# Patient Record
Sex: Female | Born: 1935 | Race: White | Hispanic: No | Smoking: Former smoker
Health system: Southern US, Community
[De-identification: ages and names within clinical notes are randomized; demographics above are authoritative.]

## PROBLEM LIST (undated history)

## (undated) DIAGNOSIS — Z853 Personal history of malignant neoplasm of breast: Secondary | ICD-10-CM

## (undated) DIAGNOSIS — I10 Essential (primary) hypertension: Secondary | ICD-10-CM

## (undated) DIAGNOSIS — I499 Cardiac arrhythmia, unspecified: Secondary | ICD-10-CM

## (undated) DIAGNOSIS — I219 Acute myocardial infarction, unspecified: Secondary | ICD-10-CM

## (undated) DIAGNOSIS — Z808 Family history of malignant neoplasm of other organs or systems: Secondary | ICD-10-CM

## (undated) DIAGNOSIS — C4490 Unspecified malignant neoplasm of skin, unspecified: Secondary | ICD-10-CM

## (undated) DIAGNOSIS — M1990 Unspecified osteoarthritis, unspecified site: Secondary | ICD-10-CM

## (undated) DIAGNOSIS — Z955 Presence of coronary angioplasty implant and graft: Secondary | ICD-10-CM

## (undated) DIAGNOSIS — Z9889 Other specified postprocedural states: Secondary | ICD-10-CM

## (undated) DIAGNOSIS — K529 Noninfective gastroenteritis and colitis, unspecified: Secondary | ICD-10-CM

## (undated) DIAGNOSIS — I2510 Atherosclerotic heart disease of native coronary artery without angina pectoris: Secondary | ICD-10-CM

## (undated) DIAGNOSIS — R002 Palpitations: Secondary | ICD-10-CM

## (undated) DIAGNOSIS — H9190 Unspecified hearing loss, unspecified ear: Secondary | ICD-10-CM

## (undated) DIAGNOSIS — Z803 Family history of malignant neoplasm of breast: Secondary | ICD-10-CM

## (undated) DIAGNOSIS — I519 Heart disease, unspecified: Secondary | ICD-10-CM

## (undated) DIAGNOSIS — C801 Malignant (primary) neoplasm, unspecified: Secondary | ICD-10-CM

## (undated) DIAGNOSIS — Z923 Personal history of irradiation: Secondary | ICD-10-CM

## (undated) DIAGNOSIS — F419 Anxiety disorder, unspecified: Secondary | ICD-10-CM

## (undated) DIAGNOSIS — M8580 Other specified disorders of bone density and structure, unspecified site: Secondary | ICD-10-CM

## (undated) DIAGNOSIS — E7800 Pure hypercholesterolemia, unspecified: Secondary | ICD-10-CM

## (undated) DIAGNOSIS — E039 Hypothyroidism, unspecified: Secondary | ICD-10-CM

## (undated) DIAGNOSIS — K5792 Diverticulitis of intestine, part unspecified, without perforation or abscess without bleeding: Secondary | ICD-10-CM

## (undated) DIAGNOSIS — C50919 Malignant neoplasm of unspecified site of unspecified female breast: Secondary | ICD-10-CM

## (undated) DIAGNOSIS — K219 Gastro-esophageal reflux disease without esophagitis: Secondary | ICD-10-CM

## (undated) DIAGNOSIS — N189 Chronic kidney disease, unspecified: Secondary | ICD-10-CM

## (undated) DIAGNOSIS — M7030 Other bursitis of elbow, unspecified elbow: Secondary | ICD-10-CM

## (undated) HISTORY — DX: Malignant neoplasm of unspecified site of unspecified female breast: C50.919

## (undated) HISTORY — DX: Family history of malignant neoplasm of breast: Z80.3

## (undated) HISTORY — DX: Heart disease, unspecified: I51.9

## (undated) HISTORY — DX: Family history of malignant neoplasm of other organs or systems: Z80.8

## (undated) HISTORY — PX: COLONOSCOPY: SHX174

## (undated) HISTORY — PX: CHOLECYSTECTOMY: SHX55

## (undated) HISTORY — DX: Other specified postprocedural states: Z98.890

## (undated) HISTORY — PX: EYE SURGERY: SHX253

## (undated) HISTORY — DX: Unspecified malignant neoplasm of skin, unspecified: C44.90

## (undated) HISTORY — DX: Personal history of malignant neoplasm of breast: Z85.3

---

## 2005-06-01 DIAGNOSIS — Z955 Presence of coronary angioplasty implant and graft: Secondary | ICD-10-CM

## 2005-06-01 HISTORY — DX: Presence of coronary angioplasty implant and graft: Z95.5

## 2005-06-01 HISTORY — PX: CORONARY ANGIOPLASTY: SHX604

## 2011-06-02 DIAGNOSIS — C50919 Malignant neoplasm of unspecified site of unspecified female breast: Secondary | ICD-10-CM

## 2011-06-02 DIAGNOSIS — C801 Malignant (primary) neoplasm, unspecified: Secondary | ICD-10-CM

## 2011-06-02 HISTORY — PX: BREAST LUMPECTOMY: SHX2

## 2011-06-02 HISTORY — DX: Malignant neoplasm of unspecified site of unspecified female breast: C50.919

## 2011-06-02 HISTORY — DX: Malignant (primary) neoplasm, unspecified: C80.1

## 2015-07-23 ENCOUNTER — Encounter
Admission: RE | Disposition: A | Discharge status: Home / Self Care | Payer: Self-pay | Source: Ambulatory Visit | Attending: Ophthalmology

## 2015-07-23 ENCOUNTER — Ambulatory Visit: Payer: Medicare PPO | Admitting: Anesthesiology

## 2015-07-23 ENCOUNTER — Encounter: Payer: Self-pay | Admitting: *Deleted

## 2015-07-23 ENCOUNTER — Ambulatory Visit
Admission: RE | Admit: 2015-07-23 | Discharge: 2015-07-23 | Disposition: A | Discharge status: Home / Self Care | Payer: Medicare PPO | Source: Ambulatory Visit | Attending: Ophthalmology | Admitting: Ophthalmology

## 2015-07-23 DIAGNOSIS — I252 Old myocardial infarction: Secondary | ICD-10-CM | POA: Diagnosis not present

## 2015-07-23 DIAGNOSIS — M8580 Other specified disorders of bone density and structure, unspecified site: Secondary | ICD-10-CM | POA: Diagnosis not present

## 2015-07-23 DIAGNOSIS — Z9049 Acquired absence of other specified parts of digestive tract: Secondary | ICD-10-CM | POA: Diagnosis not present

## 2015-07-23 DIAGNOSIS — Z888 Allergy status to other drugs, medicaments and biological substances status: Secondary | ICD-10-CM | POA: Insufficient documentation

## 2015-07-23 DIAGNOSIS — K219 Gastro-esophageal reflux disease without esophagitis: Secondary | ICD-10-CM | POA: Insufficient documentation

## 2015-07-23 DIAGNOSIS — Z955 Presence of coronary angioplasty implant and graft: Secondary | ICD-10-CM | POA: Diagnosis not present

## 2015-07-23 DIAGNOSIS — F419 Anxiety disorder, unspecified: Secondary | ICD-10-CM | POA: Insufficient documentation

## 2015-07-23 DIAGNOSIS — H9193 Unspecified hearing loss, bilateral: Secondary | ICD-10-CM | POA: Diagnosis not present

## 2015-07-23 DIAGNOSIS — Z950 Presence of cardiac pacemaker: Secondary | ICD-10-CM | POA: Diagnosis not present

## 2015-07-23 DIAGNOSIS — M719 Bursopathy, unspecified: Secondary | ICD-10-CM | POA: Insufficient documentation

## 2015-07-23 DIAGNOSIS — Z853 Personal history of malignant neoplasm of breast: Secondary | ICD-10-CM | POA: Insufficient documentation

## 2015-07-23 DIAGNOSIS — H2511 Age-related nuclear cataract, right eye: Secondary | ICD-10-CM | POA: Diagnosis present

## 2015-07-23 DIAGNOSIS — R002 Palpitations: Secondary | ICD-10-CM | POA: Diagnosis not present

## 2015-07-23 DIAGNOSIS — I10 Essential (primary) hypertension: Secondary | ICD-10-CM | POA: Insufficient documentation

## 2015-07-23 DIAGNOSIS — E7800 Pure hypercholesterolemia, unspecified: Secondary | ICD-10-CM | POA: Insufficient documentation

## 2015-07-23 DIAGNOSIS — I499 Cardiac arrhythmia, unspecified: Secondary | ICD-10-CM | POA: Diagnosis not present

## 2015-07-23 DIAGNOSIS — I2510 Atherosclerotic heart disease of native coronary artery without angina pectoris: Secondary | ICD-10-CM | POA: Diagnosis not present

## 2015-07-23 DIAGNOSIS — K5790 Diverticulosis of intestine, part unspecified, without perforation or abscess without bleeding: Secondary | ICD-10-CM | POA: Insufficient documentation

## 2015-07-23 HISTORY — DX: Atherosclerotic heart disease of native coronary artery without angina pectoris: I25.10

## 2015-07-23 HISTORY — DX: Other specified disorders of bone density and structure, unspecified site: M85.80

## 2015-07-23 HISTORY — DX: Unspecified hearing loss, unspecified ear: H91.90

## 2015-07-23 HISTORY — DX: Gastro-esophageal reflux disease without esophagitis: K21.9

## 2015-07-23 HISTORY — DX: Essential (primary) hypertension: I10

## 2015-07-23 HISTORY — PX: CATARACT EXTRACTION W/PHACO: SHX586

## 2015-07-23 HISTORY — DX: Presence of coronary angioplasty implant and graft: Z95.5

## 2015-07-23 HISTORY — DX: Cardiac arrhythmia, unspecified: I49.9

## 2015-07-23 HISTORY — DX: Diverticulitis of intestine, part unspecified, without perforation or abscess without bleeding: K57.92

## 2015-07-23 HISTORY — DX: Acute myocardial infarction, unspecified: I21.9

## 2015-07-23 HISTORY — DX: Pure hypercholesterolemia, unspecified: E78.00

## 2015-07-23 HISTORY — DX: Malignant (primary) neoplasm, unspecified: C80.1

## 2015-07-23 SURGERY — PHACOEMULSIFICATION, CATARACT, WITH IOL INSERTION
Anesthesia: Monitor Anesthesia Care | Site: Eye | Laterality: Right | Wound class: Clean

## 2015-07-23 MED ORDER — TETRACAINE HCL 0.5 % OP SOLN
OPHTHALMIC | Status: AC
  Administered 2015-07-23: 1 [drp] via OPHTHALMIC
  Filled 2015-07-23: qty 2

## 2015-07-23 MED ORDER — TETRACAINE HCL 0.5 % OP SOLN
1.0000 [drp] | Freq: Once | OPHTHALMIC | Status: AC
  Administered 2015-07-23: 1 [drp] via OPHTHALMIC

## 2015-07-23 MED ORDER — ARMC OPHTHALMIC DILATING GEL
OPHTHALMIC | Status: AC
  Administered 2015-07-23: 1 via OPHTHALMIC
  Filled 2015-07-23: qty 0.25

## 2015-07-23 MED ORDER — CEFUROXIME OPHTHALMIC INJECTION 1 MG/0.1 ML
INJECTION | OPHTHALMIC | Status: DC | PRN
  Administered 2015-07-23: .1 mL via INTRACAMERAL

## 2015-07-23 MED ORDER — EPINEPHRINE HCL 1 MG/ML IJ SOLN
INTRAMUSCULAR | Status: AC
  Filled 2015-07-23: qty 1

## 2015-07-23 MED ORDER — MOXIFLOXACIN HCL 0.5 % OP SOLN: 1.0000 [drp] | OPHTHALMIC | Status: DC | PRN

## 2015-07-23 MED ORDER — SODIUM CHLORIDE 0.9 % IV SOLN
INTRAVENOUS | Status: DC
  Administered 2015-07-23: 10:00:00 via INTRAVENOUS

## 2015-07-23 MED ORDER — EPINEPHRINE HCL 1 MG/ML IJ SOLN
INTRAOCULAR | Status: DC | PRN
  Administered 2015-07-23: 1 mL via OPHTHALMIC

## 2015-07-23 MED ORDER — CARBACHOL 0.01 % IO SOLN
INTRAOCULAR | Status: DC | PRN
  Administered 2015-07-23: .5 mL via INTRAOCULAR

## 2015-07-23 MED ORDER — NA CHONDROIT SULF-NA HYALURON 40-17 MG/ML IO SOLN
INTRAOCULAR | Status: AC
  Filled 2015-07-23: qty 1

## 2015-07-23 MED ORDER — MOXIFLOXACIN HCL 0.5 % OP SOLN
OPHTHALMIC | Status: DC | PRN
  Administered 2015-07-23: 1 [drp] via OPHTHALMIC

## 2015-07-23 MED ORDER — LIDOCAINE HCL (PF) 1 % IJ SOLN
INTRAMUSCULAR | Status: AC
  Filled 2015-07-23: qty 2

## 2015-07-23 MED ORDER — ARMC OPHTHALMIC DILATING GEL
1.0000 "application " | OPHTHALMIC | Status: DC | PRN
  Administered 2015-07-23 (×2): 1 via OPHTHALMIC

## 2015-07-23 MED ORDER — NA CHONDROIT SULF-NA HYALURON 40-17 MG/ML IO SOLN
INTRAOCULAR | Status: DC | PRN
  Administered 2015-07-23: 1 mL via INTRAOCULAR

## 2015-07-23 MED ORDER — MOXIFLOXACIN HCL 0.5 % OP SOLN
OPHTHALMIC | Status: AC
  Filled 2015-07-23: qty 3

## 2015-07-23 MED ORDER — MIDAZOLAM HCL 2 MG/2ML IJ SOLN
INTRAMUSCULAR | Status: DC | PRN
  Administered 2015-07-23: 1 mg via INTRAVENOUS

## 2015-07-23 MED ORDER — POVIDONE-IODINE 5 % OP SOLN
1.0000 "application " | Freq: Once | OPHTHALMIC | Status: AC
  Administered 2015-07-23: 1 via OPHTHALMIC

## 2015-07-23 MED ORDER — POVIDONE-IODINE 5 % OP SOLN
OPHTHALMIC | Status: AC
  Administered 2015-07-23: 1 via OPHTHALMIC
  Filled 2015-07-23: qty 30

## 2015-07-23 SURGICAL SUPPLY — 22 items
CANNULA ANT/CHMB 27GA (MISCELLANEOUS) ×2 IMPLANT
CUP MEDICINE 2OZ PLAST GRAD ST (MISCELLANEOUS) ×2 IMPLANT
GLOVE BIO SURGEON STRL SZ8 (GLOVE) ×2 IMPLANT
GLOVE BIOGEL M 6.5 STRL (GLOVE) ×2 IMPLANT
GLOVE SURG LX 8.0 MICRO (GLOVE) ×1
GLOVE SURG LX STRL 8.0 MICRO (GLOVE) ×1 IMPLANT
GOWN STRL REUS W/ TWL LRG LVL3 (GOWN DISPOSABLE) ×2 IMPLANT
GOWN STRL REUS W/TWL LRG LVL3 (GOWN DISPOSABLE) ×2
LENS IOL TECNIS 21.5 (Intraocular Lens) ×2 IMPLANT
LENS IOL TECNIS MONO 1P 21.5 (Intraocular Lens) ×1 IMPLANT
PACK CATARACT (MISCELLANEOUS) ×2 IMPLANT
PACK CATARACT BRASINGTON LX (MISCELLANEOUS) ×2 IMPLANT
PACK EYE AFTER SURG (MISCELLANEOUS) ×2 IMPLANT
SOL BSS BAG (MISCELLANEOUS) ×2
SOL PREP PVP 2OZ (MISCELLANEOUS) ×2
SOLUTION BSS BAG (MISCELLANEOUS) ×1 IMPLANT
SOLUTION PREP PVP 2OZ (MISCELLANEOUS) ×1 IMPLANT
SYR 3ML LL SCALE MARK (SYRINGE) ×2 IMPLANT
SYR 5ML LL (SYRINGE) ×2 IMPLANT
SYR TB 1ML 27GX1/2 LL (SYRINGE) ×2 IMPLANT
WATER STERILE IRR 1000ML POUR (IV SOLUTION) ×2 IMPLANT
WIPE NON LINTING 3.25X3.25 (MISCELLANEOUS) ×2 IMPLANT

## 2015-07-23 NOTE — Anesthesia Preprocedure Evaluation (Signed)
Anesthesia Evaluation  Patient identified by MRN, date of birth, ID band Patient awake    Reviewed: Allergy & Precautions, H&P , NPO status , Patient's Chart, lab work & pertinent test results, reviewed documented beta blocker date and time   History of Anesthesia Complications Negative for: history of anesthetic complications  Airway Mallampati: I  TM Distance: >3 FB Neck ROM: full    Dental no notable dental hx. (+) Missing, Chipped, Poor Dentition Bridge:   Pulmonary neg shortness of breath, neg sleep apnea, neg COPD, Recent URI , Resolved,    Pulmonary exam normal breath sounds clear to auscultation       Cardiovascular Exercise Tolerance: Good hypertension, (-) angina+ CAD, + Past MI and + Cardiac Stents  (-) CABG Normal cardiovascular exam+ dysrhythmias (-) Valvular Problems/Murmurs Rhythm:regular Rate:Normal     Neuro/Psych negative neurological ROS  negative psych ROS   GI/Hepatic Neg liver ROS, GERD  ,  Endo/Other  negative endocrine ROS  Renal/GU negative Renal ROS  negative genitourinary   Musculoskeletal   Abdominal   Peds  Hematology negative hematology ROS (+)   Anesthesia Other Findings Past Medical History:   Dysrhythmia                                                  Coronary artery disease                                      History of coronary artery stent placement                   Hypertension                                                 Myocardial infarction ([place])                                  Presence of permanent cardiac pacemaker                      GERD (gastroesophageal reflux disease)                       Cancer ([place])                                                   Comment:breast   HOH (hard of hearing)                                        Hypercholesteremia                                           Osteopenia  Diverticulitis                                               Reproductive/Obstetrics negative OB ROS                             Anesthesia Physical Anesthesia Plan  ASA: III  Anesthesia Plan: MAC   Post-op Pain Management:    Induction:   Airway Management Planned:   Additional Equipment:   Intra-op Plan:   Post-operative Plan:   Informed Consent: I have reviewed the patients History and Physical, chart, labs and discussed the procedure including the risks, benefits and alternatives for the proposed anesthesia with the patient or authorized representative who has indicated his/her understanding and acceptance.   Dental Advisory Given  Plan Discussed with: Anesthesiologist, CRNA and Surgeon  Anesthesia Plan Comments:         Anesthesia Quick Evaluation

## 2015-07-23 NOTE — Op Note (Signed)
PREOPERATIVE DIAGNOSIS:  Nuclear sclerotic cataract of the right eye.   POSTOPERATIVE DIAGNOSIS: nuclear sclerotic cataract right eye   OPERATIVE PROCEDURE:  Procedure(s): CATARACT EXTRACTION PHACO AND INTRAOCULAR LENS PLACEMENT (IOC)   SURGEON:  Birder Robson, MD.   ANESTHESIA:  Anesthesiologist: Martha Clan, MD CRNA: [name], CRNA; Jonna Clark, CRNA  1.      Managed anesthesia care. 2.      Topical tetracaine drops followed by 2% Xylocaine jelly applied in the preoperative holding area.   COMPLICATIONS:  None.   TECHNIQUE:   Stop and chop   DESCRIPTION OF PROCEDURE:  The patient was examined and consented in the preoperative holding area where the aforementioned topical anesthesia was applied to the right eye and then brought back to the Operating Room where the right eye was prepped and draped in the usual sterile ophthalmic fashion and a lid speculum was placed. A paracentesis was created with the side port blade and the anterior chamber was filled with viscoelastic. A near clear corneal incision was performed with the steel keratome. A continuous curvilinear capsulorrhexis was performed with a cystotome followed by the capsulorrhexis forceps. Hydrodissection and hydrodelineation were carried out with BSS on a blunt cannula. The lens was removed in a stop and chop  technique and the remaining cortical material was removed with the irrigation-aspiration handpiece. The capsular bag was inflated with viscoelastic and the Technis [id number]  lens was placed in the capsular bag without complication. The remaining viscoelastic was removed from the eye with the irrigation-aspiration handpiece. The wounds were hydrated. The anterior chamber was flushed with Miostat and the eye was inflated to physiologic pressure. 0.1 mL of cefuroxime concentration 10 mg/mL was placed in the anterior chamber. The wounds were found to be water tight. The eye was dressed with Vigamox. The patient was given  protective glasses to wear throughout the day and a shield with which to sleep tonight. The patient was also given drops with which to begin a drop regimen today and will follow-up with me in one day.  Implant Name Type Inv. Item Serial No. Manufacturer Lot No. LRB No. Used  LENS IOL TECNIS 21.5 - [id number] Intraocular Lens LENS IOL TECNIS 21.5 [id number]   Right 1   Procedure(s) with comments: CATARACT EXTRACTION PHACO AND INTRAOCULAR LENS PLACEMENT (IOC) (Right) - [place] 01:24 AP% 26.7 CDE 22.47 fluid pack lot # [id number]  Electronically signed: [place] 07/23/2015 11:51 AM

## 2015-07-23 NOTE — Anesthesia Postprocedure Evaluation (Signed)
Anesthesia Post Note  Patient: Sherri [name]  Procedure(s) Performed: Procedure(s) (LRB): CATARACT EXTRACTION PHACO AND INTRAOCULAR LENS PLACEMENT (IOC) (Right)  Patient location during evaluation: Short Stay Anesthesia Type: MAC Level of consciousness: awake and alert and oriented Pain management: pain level controlled Vital Signs Assessment: post-procedure vital signs reviewed and stable Respiratory status: spontaneous breathing and nonlabored ventilation Cardiovascular status: stable Postop Assessment: no headache, no backache and no signs of nausea or vomiting    Last Vitals:  Filed Vitals:   07/23/15 1011  BP: 171/73  Pulse: 55  Temp: 36.5 C  Resp: 18    Last Pain: There were no vitals filed for this visit.               [name]

## 2015-07-23 NOTE — Discharge Instructions (Signed)
AMBULATORY SURGERY  DISCHARGE INSTRUCTIONS   1) The drugs that you were given will stay in your system until tomorrow so for the next 24 hours you should not:  A) Drive an automobile B) Make any legal decisions C) Drink any alcoholic beverage   2) You may resume regular meals tomorrow.  Today it is better to start with liquids and gradually work up to solid foods.  You may eat anything you prefer, but it is better to start with liquids, then soup and crackers, and gradually work up to solid foods.   3) Please notify your doctor immediately if you have any unusual bleeding, trouble breathing, redness and pain at the surgery site, drainage, fever, or pain not relieved by medication.    4) Additional Instructions:    Eye Surgery Discharge Instructions  Expect mild scratchy sensation or mild soreness. DO NOT RUB YOUR EYE!  The day of surgery:  Minimal physical activity, but bed rest is not required  No reading, computer work, or close hand work  No bending, lifting, or straining.  May watch TV  For 24 hours:  No driving, legal decisions, or alcoholic beverages  Safety precautions  Eat anything you prefer: It is better to start with liquids, then soup then solid foods.  _____ Eye patch should be worn until postoperative exam tomorrow.  ____ Solar shield eyeglasses should be worn for comfort in the sunlight/patch while sleeping  Resume all regular medications including aspirin or Coumadin if these were discontinued prior to surgery. You may shower, bathe, shave, or wash your hair. Tylenol may be taken for mild discomfort.  Call your doctor if you experience significant pain, nausea, or vomiting, fever > 101 or other signs of infection. [phone] or [phone] Specific instructions:  Follow-up Information    Follow up with PORFILIO,WILLIAM LOUIS, MD In 1 day.   Specialty:  Ophthalmology   Why:  February 22 at 9:20am   Contact information:   [place] [address] [phone]         Please contact your physician with any problems or Same Day Surgery at [phone], Monday through Friday 6 am to 4 pm, or [place] at [facility] number at [phone].

## 2015-07-23 NOTE — Transfer of Care (Signed)
Immediate Anesthesia Transfer of Care Note  Patient: Sherri [name]  Procedure(s) Performed: Procedure(s) with comments: CATARACT EXTRACTION PHACO AND INTRAOCULAR LENS PLACEMENT (IOC) (Right) - [place] 01:24 AP% 26.7 CDE 22.47 fluid pack lot # [id number]  Patient Location: Short Stay  Anesthesia Type:MAC  Level of Consciousness: awake, alert  and oriented  Airway & Oxygen Therapy: Patient Spontanous Breathing and Patient connected to nasal cannula oxygen  Post-op Assessment: Report given to RN and Post -op Vital signs reviewed and stable  Post vital signs: Reviewed and stable  Last Vitals: 11:54 100% sat 974 temp 54 hr 137/62 18resp  Filed Vitals:   07/23/15 1011  BP: 171/73  Pulse: 55  Temp: 36.5 C  Resp: 18    Complications: No apparent anesthesia complications

## 2015-07-23 NOTE — H&P (Signed)
All labs reviewed. Abnormal studies sent to patients PCP when indicated.  Previous H&P reviewed, patient examined, there are NO CHANGES.  Sherri [name] LOUIS2/21/201711:28 AM

## 2015-08-08 ENCOUNTER — Encounter: Payer: Self-pay | Admitting: *Deleted

## 2015-08-13 ENCOUNTER — Encounter
Admission: RE | Disposition: A | Discharge status: Home / Self Care | Payer: Self-pay | Source: Ambulatory Visit | Attending: Ophthalmology

## 2015-08-13 ENCOUNTER — Ambulatory Visit: Payer: Medicare PPO | Admitting: Anesthesiology

## 2015-08-13 ENCOUNTER — Ambulatory Visit
Admission: RE | Admit: 2015-08-13 | Discharge: 2015-08-13 | Disposition: A | Discharge status: Home / Self Care | Payer: Medicare PPO | Source: Ambulatory Visit | Attending: Ophthalmology | Admitting: Ophthalmology

## 2015-08-13 ENCOUNTER — Encounter: Payer: Self-pay | Admitting: *Deleted

## 2015-08-13 DIAGNOSIS — I499 Cardiac arrhythmia, unspecified: Secondary | ICD-10-CM | POA: Diagnosis not present

## 2015-08-13 DIAGNOSIS — H2512 Age-related nuclear cataract, left eye: Secondary | ICD-10-CM | POA: Insufficient documentation

## 2015-08-13 DIAGNOSIS — Z9841 Cataract extraction status, right eye: Secondary | ICD-10-CM | POA: Insufficient documentation

## 2015-08-13 DIAGNOSIS — Z888 Allergy status to other drugs, medicaments and biological substances status: Secondary | ICD-10-CM | POA: Diagnosis not present

## 2015-08-13 DIAGNOSIS — M719 Bursopathy, unspecified: Secondary | ICD-10-CM | POA: Diagnosis not present

## 2015-08-13 DIAGNOSIS — K5790 Diverticulosis of intestine, part unspecified, without perforation or abscess without bleeding: Secondary | ICD-10-CM | POA: Insufficient documentation

## 2015-08-13 DIAGNOSIS — E7800 Pure hypercholesterolemia, unspecified: Secondary | ICD-10-CM | POA: Insufficient documentation

## 2015-08-13 DIAGNOSIS — F419 Anxiety disorder, unspecified: Secondary | ICD-10-CM | POA: Insufficient documentation

## 2015-08-13 DIAGNOSIS — I2510 Atherosclerotic heart disease of native coronary artery without angina pectoris: Secondary | ICD-10-CM | POA: Insufficient documentation

## 2015-08-13 DIAGNOSIS — Z9049 Acquired absence of other specified parts of digestive tract: Secondary | ICD-10-CM | POA: Insufficient documentation

## 2015-08-13 DIAGNOSIS — I4891 Unspecified atrial fibrillation: Secondary | ICD-10-CM | POA: Insufficient documentation

## 2015-08-13 DIAGNOSIS — R002 Palpitations: Secondary | ICD-10-CM | POA: Insufficient documentation

## 2015-08-13 DIAGNOSIS — I10 Essential (primary) hypertension: Secondary | ICD-10-CM | POA: Diagnosis not present

## 2015-08-13 DIAGNOSIS — I252 Old myocardial infarction: Secondary | ICD-10-CM | POA: Diagnosis not present

## 2015-08-13 DIAGNOSIS — M8580 Other specified disorders of bone density and structure, unspecified site: Secondary | ICD-10-CM | POA: Diagnosis not present

## 2015-08-13 DIAGNOSIS — H9193 Unspecified hearing loss, bilateral: Secondary | ICD-10-CM | POA: Diagnosis not present

## 2015-08-13 DIAGNOSIS — K219 Gastro-esophageal reflux disease without esophagitis: Secondary | ICD-10-CM | POA: Diagnosis not present

## 2015-08-13 DIAGNOSIS — Z853 Personal history of malignant neoplasm of breast: Secondary | ICD-10-CM | POA: Diagnosis not present

## 2015-08-13 DIAGNOSIS — Z955 Presence of coronary angioplasty implant and graft: Secondary | ICD-10-CM | POA: Insufficient documentation

## 2015-08-13 HISTORY — DX: Palpitations: R00.2

## 2015-08-13 HISTORY — DX: Other bursitis of elbow, unspecified elbow: M70.30

## 2015-08-13 HISTORY — DX: Anxiety disorder, unspecified: F41.9

## 2015-08-13 HISTORY — PX: CATARACT EXTRACTION W/PHACO: SHX586

## 2015-08-13 SURGERY — PHACOEMULSIFICATION, CATARACT, WITH IOL INSERTION
Anesthesia: Monitor Anesthesia Care | Site: Eye | Laterality: Left | Wound class: Clean

## 2015-08-13 MED ORDER — NA CHONDROIT SULF-NA HYALURON 40-17 MG/ML IO SOLN
INTRAOCULAR | Status: AC
  Filled 2015-08-13: qty 1

## 2015-08-13 MED ORDER — TETRACAINE HCL 0.5 % OP SOLN
1.0000 [drp] | OPHTHALMIC | Status: AC | PRN
  Administered 2015-08-13: 1 [drp] via OPHTHALMIC

## 2015-08-13 MED ORDER — SODIUM CHLORIDE 0.9 % IV SOLN
INTRAVENOUS | Status: DC
  Administered 2015-08-13: 50 mL/h via INTRAVENOUS
  Administered 2015-08-13: 13:00:00 via INTRAVENOUS

## 2015-08-13 MED ORDER — MOXIFLOXACIN HCL 0.5 % OP SOLN
OPHTHALMIC | Status: DC | PRN
  Administered 2015-08-13: 1 [drp] via OPHTHALMIC

## 2015-08-13 MED ORDER — ARMC OPHTHALMIC DILATING GEL
1.0000 "application " | OPHTHALMIC | Status: AC | PRN
  Administered 2015-08-13 (×2): 1 via OPHTHALMIC

## 2015-08-13 MED ORDER — EPINEPHRINE HCL 1 MG/ML IJ SOLN
INTRAMUSCULAR | Status: AC
  Filled 2015-08-13: qty 1

## 2015-08-13 MED ORDER — MOXIFLOXACIN HCL 0.5 % OP SOLN
OPHTHALMIC | Status: AC
  Filled 2015-08-13: qty 3

## 2015-08-13 MED ORDER — ARMC OPHTHALMIC DILATING GEL
OPHTHALMIC | Status: AC
  Administered 2015-08-13: 1 via OPHTHALMIC
  Filled 2015-08-13: qty 0.25

## 2015-08-13 MED ORDER — POVIDONE-IODINE 5 % OP SOLN
OPHTHALMIC | Status: AC
  Administered 2015-08-13: 1 via OPHTHALMIC
  Filled 2015-08-13: qty 30

## 2015-08-13 MED ORDER — FENTANYL CITRATE (PF) 100 MCG/2ML IJ SOLN
INTRAMUSCULAR | Status: DC | PRN
  Administered 2015-08-13: 50 ug via INTRAVENOUS

## 2015-08-13 MED ORDER — CEFUROXIME OPHTHALMIC INJECTION 1 MG/0.1 ML
INJECTION | OPHTHALMIC | Status: AC
  Filled 2015-08-13: qty 0.1

## 2015-08-13 MED ORDER — CARBACHOL 0.01 % IO SOLN
INTRAOCULAR | Status: DC | PRN
  Administered 2015-08-13: .5 mL via INTRAOCULAR

## 2015-08-13 MED ORDER — EPINEPHRINE HCL 1 MG/ML IJ SOLN
INTRAMUSCULAR | Status: DC | PRN
  Administered 2015-08-13: 1 mL via OPHTHALMIC

## 2015-08-13 MED ORDER — MOXIFLOXACIN HCL 0.5 % OP SOLN: 1.0000 [drp] | OPHTHALMIC | Status: DC | PRN

## 2015-08-13 MED ORDER — TETRACAINE HCL 0.5 % OP SOLN
OPHTHALMIC | Status: AC
  Administered 2015-08-13: 1 [drp] via OPHTHALMIC
  Filled 2015-08-13: qty 2

## 2015-08-13 MED ORDER — CEFUROXIME OPHTHALMIC INJECTION 1 MG/0.1 ML
INJECTION | OPHTHALMIC | Status: DC | PRN
  Administered 2015-08-13: .1 mL via INTRACAMERAL

## 2015-08-13 MED ORDER — NA CHONDROIT SULF-NA HYALURON 40-17 MG/ML IO SOLN
INTRAOCULAR | Status: DC | PRN
  Administered 2015-08-13: 1 mL via INTRAOCULAR

## 2015-08-13 MED ORDER — POVIDONE-IODINE 5 % OP SOLN
1.0000 "application " | OPHTHALMIC | Status: AC | PRN
  Administered 2015-08-13: 1 via OPHTHALMIC

## 2015-08-13 SURGICAL SUPPLY — 22 items
CANNULA ANT/CHMB 27GA (MISCELLANEOUS) ×2 IMPLANT
CUP MEDICINE 2OZ PLAST GRAD ST (MISCELLANEOUS) ×2 IMPLANT
GLOVE BIO SURGEON STRL SZ8 (GLOVE) ×2 IMPLANT
GLOVE BIOGEL M 6.5 STRL (GLOVE) ×2 IMPLANT
GLOVE SURG LX 8.0 MICRO (GLOVE) ×1
GLOVE SURG LX STRL 8.0 MICRO (GLOVE) ×1 IMPLANT
GOWN STRL REUS W/ TWL LRG LVL3 (GOWN DISPOSABLE) ×2 IMPLANT
GOWN STRL REUS W/TWL LRG LVL3 (GOWN DISPOSABLE) ×2
LENS IOL TECNIS 24.5 (Intraocular Lens) ×2 IMPLANT
LENS IOL TECNIS MONO 1P 24.5 (Intraocular Lens) ×1 IMPLANT
PACK CATARACT (MISCELLANEOUS) ×2 IMPLANT
PACK CATARACT BRASINGTON LX (MISCELLANEOUS) ×2 IMPLANT
PACK EYE AFTER SURG (MISCELLANEOUS) ×2 IMPLANT
SOL BSS BAG (MISCELLANEOUS) ×2
SOL PREP PVP 2OZ (MISCELLANEOUS) ×2
SOLUTION BSS BAG (MISCELLANEOUS) ×1 IMPLANT
SOLUTION PREP PVP 2OZ (MISCELLANEOUS) ×1 IMPLANT
SYR 3ML LL SCALE MARK (SYRINGE) ×2 IMPLANT
SYR 5ML LL (SYRINGE) ×2 IMPLANT
SYR TB 1ML 27GX1/2 LL (SYRINGE) ×2 IMPLANT
WATER STERILE IRR 1000ML POUR (IV SOLUTION) ×2 IMPLANT
WIPE NON LINTING 3.25X3.25 (MISCELLANEOUS) ×2 IMPLANT

## 2015-08-13 NOTE — Discharge Instructions (Signed)
Follow Dr. Inda Coke instruction sheet with eye drops.   General Anesthesia, Adult, Care After Refer to this sheet in the next few weeks. These instructions provide you with information on caring for yourself after your procedure. Your health care provider may also give you more specific instructions. Your treatment has been planned according to current medical practices, but problems sometimes occur. Call your health care provider if you have any problems or questions after your procedure. WHAT TO EXPECT AFTER THE PROCEDURE After the procedure, it is typical to experience:  Sleepiness.  Nausea and vomiting. HOME CARE INSTRUCTIONS  For the first 24 hours after general anesthesia:  Have a responsible person with you.  Do not drive a car. If you are alone, do not take public transportation.  Do not drink alcohol.  Do not take medicine that has not been prescribed by your health care provider.  Do not sign important papers or make important decisions.  You may resume a normal diet and activities as directed by your health care provider.  Change bandages (dressings) as directed.  If you have questions or problems that seem related to general anesthesia, call the hospital and ask for the anesthetist or anesthesiologist on call. SEEK MEDICAL CARE IF:  You have nausea and vomiting that continue the day after anesthesia.  You develop a rash. SEEK IMMEDIATE MEDICAL CARE IF:   You have difficulty breathing.  You have chest pain.  You have any allergic problems.   This information is not intended to replace advice given to you by your health care provider. Make sure you discuss any questions you have with your health care provider.   Document Released: 08/24/2000 Document Revised: 06/08/2014 Document Reviewed: 09/16/2011 Elsevier Interactive Patient Education [employer].

## 2015-08-13 NOTE — Transfer of Care (Signed)
Immediate Anesthesia Transfer of Care Note  Patient: Sherri [name]  Procedure(s) Performed: Procedure(s) with comments: CATARACT EXTRACTION PHACO AND INTRAOCULAR LENS PLACEMENT ([place]) (Left) - [place] 02:40 AP% 30.7 CDE 49.28 fluid pack lot # [id number]  Patient Location: PACU  Anesthesia Type:MAC  Level of Consciousness: awake  Airway & Oxygen Therapy: Patient Spontanous Breathing  Post-op Assessment: Report given to RN  Post vital signs: Reviewed and stable  Last Vitals:  Filed Vitals:   08/13/15 1205  BP: 170/75  Pulse: 55  Temp: 36.6 C  Resp: 16    Complications: No apparent anesthesia complications

## 2015-08-13 NOTE — Anesthesia Postprocedure Evaluation (Signed)
Anesthesia Post Note  Patient: Sherri [name]  Procedure(s) Performed: Procedure(s) (LRB): CATARACT EXTRACTION PHACO AND INTRAOCULAR LENS PLACEMENT (IOC) (Left)  Patient location during evaluation: PACU Anesthesia Type: MAC Level of consciousness: awake Pain management: pain level controlled Vital Signs Assessment: post-procedure vital signs reviewed and stable Respiratory status: spontaneous breathing Cardiovascular status: blood pressure returned to baseline Postop Assessment: no headache Anesthetic complications: no    Last Vitals:  Filed Vitals:   08/13/15 1205 08/13/15 1342  BP: 170/75 163/57  Pulse: 55 50  Temp: 36.6 C 36.1 C  Resp: 16 16    Last Pain: There were no vitals filed for this visit.               Buckner Malta

## 2015-08-13 NOTE — H&P (Signed)
All labs reviewed. Abnormal studies sent to patients PCP when indicated.  Previous H&P reviewed, patient examined, there are NO CHANGES.  Sherri [name] LOUIS3/14/20171:06 PM

## 2015-08-13 NOTE — Anesthesia Preprocedure Evaluation (Signed)
Anesthesia Evaluation  Patient identified by MRN, date of birth, ID band Patient awake    Reviewed: Allergy & Precautions, NPO status , Patient's Chart, lab work & pertinent test results  Airway Mallampati: II       Dental  (+) Teeth Intact   Pulmonary neg pulmonary ROS,    breath sounds clear to auscultation       Cardiovascular hypertension, + CAD and + Past MI  + dysrhythmias Atrial Fibrillation  Rhythm:Irregular     Neuro/Psych    GI/Hepatic Neg liver ROS, GERD  ,  Endo/Other  negative endocrine ROS  Renal/GU negative Renal ROS     Musculoskeletal negative musculoskeletal ROS (+)   Abdominal   Peds  Hematology negative hematology ROS (+)   Anesthesia Other Findings   Reproductive/Obstetrics                             Anesthesia Physical Anesthesia Plan  ASA: II  Anesthesia Plan: MAC   Post-op Pain Management:    Induction: Intravenous  Airway Management Planned: Natural Airway and Nasal Cannula  Additional Equipment:   Intra-op Plan:   Post-operative Plan:   Informed Consent: I have reviewed the patients History and Physical, chart, labs and discussed the procedure including the risks, benefits and alternatives for the proposed anesthesia with the patient or authorized representative who has indicated his/her understanding and acceptance.     Plan Discussed with: CRNA  Anesthesia Plan Comments:         Anesthesia Quick Evaluation

## 2015-08-13 NOTE — Op Note (Signed)
PREOPERATIVE DIAGNOSIS:  Nuclear sclerotic cataract of the left eye.   POSTOPERATIVE DIAGNOSIS:  nuclear sclerotic cataract left eye   OPERATIVE PROCEDURE:  Procedure(s): CATARACT EXTRACTION PHACO AND INTRAOCULAR LENS PLACEMENT (IOC)   SURGEON:  Birder Robson, MD.   ANESTHESIA:   Anesthesiologist: Iver Nestle, MD  1.      Managed anesthesia care. 2.      Topical tetracaine drops followed by 2% Xylocaine jelly applied in the preoperative holding area.   COMPLICATIONS:  None.   TECHNIQUE:   Stop and chop   DESCRIPTION OF PROCEDURE:  The patient was examined and consented in the preoperative holding area where the aforementioned topical anesthesia was applied to the left eye and then brought back to the Operating Room where the left eye was prepped and draped in the usual sterile ophthalmic fashion and a lid speculum was placed. A paracentesis was created with the side port blade and the anterior chamber was filled with viscoelastic. A near clear corneal incision was performed with the steel keratome. A continuous curvilinear capsulorrhexis was performed with a cystotome followed by the capsulorrhexis forceps. Hydrodissection and hydrodelineation were carried out with BSS on a blunt cannula. The lens was removed in a stop and chop  technique and the remaining cortical material was removed with the irrigation-aspiration handpiece. The capsular bag was inflated with viscoelastic and the Technis [id number] lens was placed in the capsular bag without complication. The remaining viscoelastic was removed from the eye with the irrigation-aspiration handpiece. The wounds were hydrated. The anterior chamber was flushed with Miostat and the eye was inflated to physiologic pressure. 0.1 mL of cefuroxime concentration 10 mg/mL was placed in the anterior chamber. The wounds were found to be water tight. The eye was dressed with Vigamox. The patient was given protective glasses to wear throughout the  day and a shield with which to sleep tonight. The patient was also given drops with which to begin a drop regimen today and will follow-up with me in one day.  Implant Name Type Inv. Item Serial No. Manufacturer Lot No. LRB No. Used  LENS IOL TECNIS 24.5 - [id number] Intraocular Lens LENS IOL TECNIS 24.5 [id number] AMO   Left 1   Procedure(s) with comments: CATARACT EXTRACTION PHACO AND INTRAOCULAR LENS PLACEMENT (IOC) (Left) - [place] 02:40 AP% 30.7 CDE 49.28 fluid pack lot # [id number]  Electronically signed: [place] 08/13/2015 1:37 PM

## 2015-08-14 ENCOUNTER — Encounter: Payer: Self-pay | Admitting: Ophthalmology

## 2015-09-26 ENCOUNTER — Emergency Department
Admission: EM | Admit: 2015-09-26 | Discharge: 2015-09-26 | Disposition: A | Discharge status: Home / Self Care | Payer: Medicare PPO | Attending: Emergency Medicine | Admitting: Emergency Medicine

## 2015-09-26 ENCOUNTER — Emergency Department: Payer: Medicare PPO

## 2015-09-26 ENCOUNTER — Encounter: Payer: Self-pay | Admitting: Emergency Medicine

## 2015-09-26 DIAGNOSIS — Y999 Unspecified external cause status: Secondary | ICD-10-CM | POA: Diagnosis not present

## 2015-09-26 DIAGNOSIS — Z9861 Coronary angioplasty status: Secondary | ICD-10-CM | POA: Insufficient documentation

## 2015-09-26 DIAGNOSIS — W010XXA Fall on same level from slipping, tripping and stumbling without subsequent striking against object, initial encounter: Secondary | ICD-10-CM | POA: Diagnosis not present

## 2015-09-26 DIAGNOSIS — I252 Old myocardial infarction: Secondary | ICD-10-CM | POA: Insufficient documentation

## 2015-09-26 DIAGNOSIS — I10 Essential (primary) hypertension: Secondary | ICD-10-CM | POA: Insufficient documentation

## 2015-09-26 DIAGNOSIS — Y9301 Activity, walking, marching and hiking: Secondary | ICD-10-CM | POA: Insufficient documentation

## 2015-09-26 DIAGNOSIS — Z7982 Long term (current) use of aspirin: Secondary | ICD-10-CM | POA: Diagnosis not present

## 2015-09-26 DIAGNOSIS — Z79899 Other long term (current) drug therapy: Secondary | ICD-10-CM | POA: Diagnosis not present

## 2015-09-26 DIAGNOSIS — S50311A Abrasion of right elbow, initial encounter: Secondary | ICD-10-CM | POA: Diagnosis present

## 2015-09-26 DIAGNOSIS — S5001XA Contusion of right elbow, initial encounter: Secondary | ICD-10-CM

## 2015-09-26 DIAGNOSIS — S52021A Displaced fracture of olecranon process without intraarticular extension of right ulna, initial encounter for closed fracture: Secondary | ICD-10-CM | POA: Diagnosis not present

## 2015-09-26 DIAGNOSIS — Z853 Personal history of malignant neoplasm of breast: Secondary | ICD-10-CM | POA: Insufficient documentation

## 2015-09-26 DIAGNOSIS — I2510 Atherosclerotic heart disease of native coronary artery without angina pectoris: Secondary | ICD-10-CM | POA: Insufficient documentation

## 2015-09-26 DIAGNOSIS — T07XXXA Unspecified multiple injuries, initial encounter: Secondary | ICD-10-CM

## 2015-09-26 DIAGNOSIS — Y929 Unspecified place or not applicable: Secondary | ICD-10-CM | POA: Diagnosis not present

## 2015-09-26 MED ORDER — TRAMADOL HCL 50 MG PO TABS: 50.0000 mg | ORAL_TABLET | Freq: Four times a day (QID) | ORAL | Status: DC | PRN

## 2015-09-26 MED ORDER — BACITRACIN ZINC 500 UNIT/GM EX OINT: 1.0000 "application " | TOPICAL_OINTMENT | Freq: Two times a day (BID) | CUTANEOUS | Status: DC

## 2015-09-26 MED ORDER — BACITRACIN ZINC 500 UNIT/GM EX OINT
TOPICAL_OINTMENT | CUTANEOUS | Status: AC
  Filled 2015-09-26: qty 1.8

## 2015-09-26 NOTE — ED Provider Notes (Signed)
[facility] Emergency Department Provider Note ____________________________________________  Time seen: Approximately 1:59 PM  I have reviewed the triage vital signs and the nursing notes.   HISTORY  Chief Complaint Extremity Laceration    HPI Sherri [name] is a 82 y.o. female who presents to the emergency department for evaluation of right elbow contusion and abrasion, left hand contusions, and abrasions to bilateral knees. She states that while walking on a trail, the ground was uneven and she tripped. She had a non-syncopal fall. The fall was witnessed by her friend. Patient is on an 81 mg aspirin per day, but no other blood thinners.  Past Medical History  Diagnosis Date  . Coronary artery disease   . History of coronary artery stent placement 2007    one stent  . Hypertension   . HOH (hard of hearing)   . Hypercholesteremia   . Osteopenia   . Diverticulitis   . Myocardial infarction ([place])     2007  . Anxiety   . Bursitis of elbow     RIGHT  . Palpitations   . Dysrhythmia     according to anxiety, tachy  . Cancer [facility]) 2013    right breast lumpectomy with a few nodes removed  . GERD (gastroesophageal reflux disease)     okay since took her gallbladder out    There are no active problems to display for this patient.   Past Surgical History  Procedure Laterality Date  . Cholecystectomy    . Breast lumpectomy    . Colonoscopy    . Cataract extraction w/phaco Right 07/23/2015    Procedure: CATARACT EXTRACTION PHACO AND INTRAOCULAR LENS PLACEMENT (IOC);  Surgeon: Birder Robson, MD;  Location: [facility] ORS;  Service: Ophthalmology;  Laterality: Right;  [place] 01:24 AP% 26.7 CDE 22.47 fluid pack lot # [id number]  . Coronary angioplasty  2007  . Cataract extraction w/phaco Left 08/13/2015    Procedure: CATARACT EXTRACTION PHACO AND INTRAOCULAR LENS PLACEMENT (IOC);  Surgeon: Birder Robson, MD;  Location: [facility] ORS;  Service: Ophthalmology;   Laterality: Left;  [place] 02:40 AP% 30.7 CDE 49.28 fluid pack lot # [id number]    Current Outpatient Rx  Name  Route  Sig  Dispense  Refill  . aspirin 81 MG tablet   Oral   Take 81 mg by mouth daily.         Marland Kitchen alendronate (FOSAMAX) 70 MG tablet   Oral   Take 70 mg by mouth once a week. Take with a full glass of water on an empty stomach.         Marland Kitchen anastrozole (ARIMIDEX) 1 MG tablet   Oral   Take 1 mg by mouth daily.         Marland Kitchen aspirin 81 MG tablet   Oral   Take 81 mg by mouth at bedtime.         Marland Kitchen CALCIUM CITRATE PO   Oral   Take by mouth.         . carvedilol (COREG) 6.25 MG tablet   Oral   Take 6.25 mg by mouth 2 (two) times daily.         . Cholecalciferol (VITAMIN D-3 PO)   Oral   Take 1,800 mg by mouth at bedtime.         . Coenzyme Q10 (CO Q 10 PO)   Oral   Take 100 mg by mouth daily.         Marland Kitchen lisinopril (PRINIVIL,ZESTRIL) 10 MG  tablet   Oral   Take 5 mg by mouth 2 (two) times daily.         . simvastatin (ZOCOR) 40 MG tablet   Oral   Take 40 mg by mouth at bedtime.         . traMADol (ULTRAM) 50 MG tablet   Oral   Take 1 tablet (50 mg total) by mouth every 6 (six) hours as needed.   12 tablet   0   . vitamin B-12 (CYANOCOBALAMIN) 1000 MCG tablet   Oral   Take 1,000 mcg by mouth daily.           Allergies Prednisone  No family history on file.  Social History Social History  Substance Use Topics  . Smoking status: Never Smoker   . Smokeless tobacco: None  . Alcohol Use: Yes     Comment: wine occasionally    Review of Systems Constitutional: No recent illness. Eyes: No visual changes. Cardiovascular: Denies chest pain or palpitations. Respiratory: Denies shortness of breath. Gastrointestinal: No abdominal pain.  Musculoskeletal: Pain in Right elbow Skin: Negative for rash. Positive for abrasions Neurological: Negative for headaches, focal weakness or  numbness.  ____________________________________________   PHYSICAL EXAM:  VITAL SIGNS: ED Triage Vitals  Enc Vitals Group     BP 09/26/15 1220 178/76 mmHg     Pulse Rate 09/26/15 1220 60     Resp 09/26/15 1220 18     Temp 09/26/15 1220 97.7 F (36.5 C)     Temp Source 09/26/15 1220 Oral     SpO2 09/26/15 1220 98 %     Weight 09/26/15 1220 131 lb (59.421 kg)     Height 09/26/15 1220 5\' 3"  (1.6 m)     Head Cir --      Peak Flow --      Pain Score --      Pain Loc --      Pain Edu? --      Excl. in [place]? --     Constitutional: Alert and oriented. Well appearing and in no acute distress. Eyes: Conjunctivae are normal. EOMI. Head: Atraumatic. Neck: Nexus criteria negative. Respiratory: Normal respiratory effort.   Musculoskeletal: Limited extension of the right elbow due to pain. Full range of motion of the right wrist and fingers. Full range of motion of the left hand and fingers. Full range of motion in bilateral knees Neurologic:  Normal speech and language. No gross focal neurologic deficits are appreciated. Speech is normal. No gait instability. Skin: Hematoma noted over the right elbow, abrasions to bilateral knees. Contusions to the left hand. ____________________________________________   LABS (all labs ordered are listed, but only abnormal results are displayed)  Labs Reviewed - No data to display ____________________________________________  RADIOLOGY  Proximal olecranon fracture with retraction.  I, Sherrie George, personally viewed and evaluated these images (plain radiographs) as part of my medical decision making, as well as reviewing the written report by the radiologist.  Initial fracture care provided. Follow-up will be greater than 24 hours.  ____________________________________________   PROCEDURES  Procedure(s) performed:   SPLINT APPLICATION Date/Time: 123XX123 PM Authorized by: Sherrie George Consent: Verbal consent obtained. Risks and benefits:  risks, benefits and alternatives were discussed Consent given by: patient Splint applied by: Romilda Joy, ER technician Location details: Right long arm Splint type: Extension ocl Supplies used: OCL and ACE Post-procedure: The splinted body part was neurovascularly unchanged following the procedure. Patient tolerance: Patient tolerated the procedure well with no immediate complications.  ____________________________________________   INITIAL IMPRESSION / ASSESSMENT AND PLAN / ED COURSE  Pertinent labs & imaging results that were available during my care of the patient were reviewed by me and considered in my medical decision making (see chart for details). Per consult with Dr. Marry Guan, she is to be splinted as close to full extension is possible. She is to call the office to schedule follow-up appointment which will probably be early next week. Patient was also advised to ice the elbow frequently until her follow-up appointment. She was encouraged to return to the emergency department for symptoms that change or worsen if she is unable to see the orthopedist right away. She will be given tramadol to take for severe pain, otherwise she will take Tylenol as needed. ____________________________________________   FINAL CLINICAL IMPRESSION(S) / ED DIAGNOSES  Final diagnoses:  Olecranon fracture, right, closed, initial encounter  Abrasions of multiple sites  Traumatic hematoma of elbow, right, initial encounter       Victorino Dike, FNP 09/26/15 1613  Carrie Mew, MD 09/30/15 1529

## 2015-09-26 NOTE — ED Notes (Signed)
Pt presents with skin tear to right elbow and right knee from tripping over uneven sidewalk. Bleeding controlled.

## 2015-09-26 NOTE — ED Notes (Signed)
See triage   Swelling to right elbow  Abrasion to elbow,both knees and hands

## 2015-09-28 ENCOUNTER — Emergency Department
Admission: EM | Admit: 2015-09-28 | Discharge: 2015-09-28 | Disposition: A | Discharge status: Home / Self Care | Payer: Medicare PPO | Attending: Emergency Medicine | Admitting: Emergency Medicine

## 2015-09-28 ENCOUNTER — Encounter: Payer: Self-pay | Admitting: Emergency Medicine

## 2015-09-28 DIAGNOSIS — Z7982 Long term (current) use of aspirin: Secondary | ICD-10-CM | POA: Insufficient documentation

## 2015-09-28 DIAGNOSIS — I2510 Atherosclerotic heart disease of native coronary artery without angina pectoris: Secondary | ICD-10-CM | POA: Insufficient documentation

## 2015-09-28 DIAGNOSIS — Z79899 Other long term (current) drug therapy: Secondary | ICD-10-CM | POA: Diagnosis not present

## 2015-09-28 DIAGNOSIS — Z4801 Encounter for change or removal of surgical wound dressing: Secondary | ICD-10-CM | POA: Insufficient documentation

## 2015-09-28 DIAGNOSIS — I10 Essential (primary) hypertension: Secondary | ICD-10-CM | POA: Insufficient documentation

## 2015-09-28 DIAGNOSIS — Z853 Personal history of malignant neoplasm of breast: Secondary | ICD-10-CM | POA: Diagnosis not present

## 2015-09-28 DIAGNOSIS — S42401K Unspecified fracture of lower end of right humerus, subsequent encounter for fracture with nonunion: Secondary | ICD-10-CM | POA: Insufficient documentation

## 2015-09-28 DIAGNOSIS — Z5189 Encounter for other specified aftercare: Secondary | ICD-10-CM

## 2015-09-28 DIAGNOSIS — I252 Old myocardial infarction: Secondary | ICD-10-CM | POA: Diagnosis not present

## 2015-09-28 DIAGNOSIS — W19XXXA Unspecified fall, initial encounter: Secondary | ICD-10-CM | POA: Insufficient documentation

## 2015-09-28 DIAGNOSIS — Z4789 Encounter for other orthopedic aftercare: Secondary | ICD-10-CM

## 2015-09-28 MED ORDER — BACITRACIN ZINC 500 UNIT/GM EX OINT
TOPICAL_OINTMENT | Freq: Every day | CUTANEOUS | Status: DC
  Administered 2015-09-28: 1 via TOPICAL
  Filled 2015-09-28: qty 0.9

## 2015-09-28 NOTE — ED Provider Notes (Signed)
[facility] Emergency Department Provider Note ____________________________________________  Time seen: 1220  I have reviewed the triage vital signs and the nursing notes.  HISTORY  Chief Complaint  Wound Check  HPI Sherri [name] is a 82 y.o. female resistance to the ED for evaluation of RUE swelling and bruising following application of a posterior, long arm splint two days prior. Patient was stabilized following a closed, displaced olecranon fracture to the right elbow. She was placed in extension in the splint. Today she noted some swelling to the hand and fingers distally, as well as some bruising to the upper arm. She denies any pain or paresthesias. She will see Dr. Rudene Christians on Thursday  Past Medical History  Diagnosis Date  . Coronary artery disease   . History of coronary artery stent placement 2007    one stent  . Hypertension   . HOH (hard of hearing)   . Hypercholesteremia   . Osteopenia   . Diverticulitis   . Myocardial infarction ([place])     2007  . Anxiety   . Bursitis of elbow     RIGHT  . Palpitations   . Dysrhythmia     according to anxiety, tachy  . Cancer [facility]) 2013    right breast lumpectomy with a few nodes removed  . GERD (gastroesophageal reflux disease)     okay since took her gallbladder out    There are no active problems to display for this patient.   Past Surgical History  Procedure Laterality Date  . Cholecystectomy    . Breast lumpectomy    . Colonoscopy    . Cataract extraction w/phaco Right 07/23/2015    Procedure: CATARACT EXTRACTION PHACO AND INTRAOCULAR LENS PLACEMENT (IOC);  Surgeon: Birder Robson, MD;  Location: [facility] ORS;  Service: Ophthalmology;  Laterality: Right;  [place] 01:24 AP% 26.7 CDE 22.47 fluid pack lot # [id number]  . Coronary angioplasty  2007  . Cataract extraction w/phaco Left 08/13/2015    Procedure: CATARACT EXTRACTION PHACO AND INTRAOCULAR LENS PLACEMENT (IOC);  Surgeon: Birder Robson, MD;   Location: [facility] ORS;  Service: Ophthalmology;  Laterality: Left;  [place] 02:40 AP% 30.7 CDE 49.28 fluid pack lot # [id number]    Current Outpatient Rx  Name  Route  Sig  Dispense  Refill  . alendronate (FOSAMAX) 70 MG tablet   Oral   Take 70 mg by mouth once a week. Take with a full glass of water on an empty stomach.         Marland Kitchen anastrozole (ARIMIDEX) 1 MG tablet   Oral   Take 1 mg by mouth daily.         Marland Kitchen aspirin 81 MG tablet   Oral   Take 81 mg by mouth at bedtime.         Marland Kitchen aspirin 81 MG tablet   Oral   Take 81 mg by mouth daily.         Marland Kitchen CALCIUM CITRATE PO   Oral   Take by mouth.         . carvedilol (COREG) 6.25 MG tablet   Oral   Take 6.25 mg by mouth 2 (two) times daily.         . Cholecalciferol (VITAMIN D-3 PO)   Oral   Take 1,800 mg by mouth at bedtime.         . Coenzyme Q10 (CO Q 10 PO)   Oral   Take 100 mg by mouth daily.         Marland Kitchen  lisinopril (PRINIVIL,ZESTRIL) 10 MG tablet   Oral   Take 5 mg by mouth 2 (two) times daily.         . simvastatin (ZOCOR) 40 MG tablet   Oral   Take 40 mg by mouth at bedtime.         . traMADol (ULTRAM) 50 MG tablet   Oral   Take 1 tablet (50 mg total) by mouth every 6 (six) hours as needed.   12 tablet   0   . vitamin B-12 (CYANOCOBALAMIN) 1000 MCG tablet   Oral   Take 1,000 mcg by mouth daily.          Allergies Prednisone  History reviewed. No pertinent family history.  Social History Social History  Substance Use Topics  . Smoking status: Never Smoker   . Smokeless tobacco: None  . Alcohol Use: Yes     Comment: wine occasionally   Review of Systems  Constitutional: Negative for fever. Musculoskeletal: Negative for back pain. Right hand edema as above. Skin: Negative for rash. Right UE bruising noted.  Neurological: Negative for headaches, focal weakness or numbness. ____________________________________________  PHYSICAL EXAM:  VITAL SIGNS: ED Triage Vitals  Enc Vitals  Group     BP 09/28/15 1143 155/66 mmHg     Pulse Rate 09/28/15 1143 56     Resp 09/28/15 1143 20     Temp 09/28/15 1143 97.9 F (36.6 C)     Temp Source 09/28/15 1143 Oral     SpO2 09/28/15 1143 99 %     Weight 09/28/15 1143 131 lb (59.421 kg)     Height --      Head Cir --      Peak Flow --      Pain Score 09/28/15 1144 0     Pain Loc --      Pain Edu? --      Excl. in [place]? --     Constitutional: Alert and oriented. Well appearing and in no distress. Head: Normocephalic and atraumatic. Cardiovascular: Normal rate, regular rhythm. Normal distal pulses and capillary refill.  Respiratory: Normal respiratory effort.  Musculoskeletal: Normal composite fist and wrist exam on the right. Nontender with normal range of motion in all other extremities.  Neurologic: CN II-XII grossly intact. Normal intrinsic and opposition testing. Normal gait without ataxia. Normal speech and language. No gross focal neurologic deficits are appreciated. Skin:  Skin is warm, dry. No rash noted. Superficial abrasion noted to the lateral right elbow, under the splint. No signs of infection. Patient with moderate distal edema to the fingers from the MCPs on the right hand due to UE splint application and dependent positioning of arm.  ____________________________________________  PROCEDURES Bacitracin and dressing applied   SPLINT APPLICATION Date/Time: 123XX123 PM Authorized by: Melvenia Needles Consent: Verbal consent obtained. Risks and benefits: risks, benefits and alternatives were discussed Consent given by: patient Splint applied by: Thereasa Solo, nurse technician Location details: RUE Splint type: posterior long arm OCL in extension Supplies used: ortho glass Post-procedure: The splinted body part was neurovascularly unchanged following the procedure. Patient tolerance: Patient tolerated the procedure well with no immediate  complications. ____________________________________________  INITIAL IMPRESSION / ASSESSMENT AND PLAN / ED COURSE  Patient with re-application of her RUE splint due to dependent edema and hand swelling. She will follow-up with Dr. Rudene Christians on Monday for surgery as planned. She is advised to keep the arm splinted ane elevated to the chest as necessary. Massage and flex finger  to resolve edema. Lossen ace bandage as needed.  ____________________________________________  FINAL CLINICAL IMPRESSION(S) / ED DIAGNOSES  Final diagnoses:  Visit for wound check  Cast discomfort  Fracture of elbow, closed, right, with nonunion, subsequent encounter     Melvenia Needles, PA-C 09/28/15 1703  Lisa Roca, MD 09/29/15 [phone]

## 2015-09-28 NOTE — Discharge Instructions (Signed)
Keep the splint clean, dry, and in place until your surgery on Monday. Adjust as needed for increased swelling to the hand. Move fingers and massage hand to prevent dependent edema. Return as needed.

## 2015-09-28 NOTE — ED Notes (Signed)
Pt to ed with c/o right elbow pain and swelling.  Pt was here on Thursday after she fell and fractured her elbow.  Pt states swelling increased today and she noticed some bruising on the right upper arm. OCL removed at triage.  Pt with +pulse, movement and sensation in right wrist.

## 2015-10-01 ENCOUNTER — Ambulatory Visit
Admission: RE | Admit: 2015-10-01 | Discharge: 2015-10-01 | Disposition: A | Discharge status: Home / Self Care | Payer: Medicare PPO | Source: Ambulatory Visit | Attending: Orthopedic Surgery | Admitting: Orthopedic Surgery

## 2015-10-01 ENCOUNTER — Encounter
Admission: RE | Disposition: A | Discharge status: Home / Self Care | Payer: Self-pay | Source: Ambulatory Visit | Attending: Orthopedic Surgery

## 2015-10-01 ENCOUNTER — Ambulatory Visit: Payer: Medicare PPO | Admitting: Anesthesiology

## 2015-10-01 ENCOUNTER — Encounter: Payer: Self-pay | Admitting: Anesthesiology

## 2015-10-01 DIAGNOSIS — Z7982 Long term (current) use of aspirin: Secondary | ICD-10-CM | POA: Diagnosis not present

## 2015-10-01 DIAGNOSIS — E7800 Pure hypercholesterolemia, unspecified: Secondary | ICD-10-CM | POA: Insufficient documentation

## 2015-10-01 DIAGNOSIS — S52021A Displaced fracture of olecranon process without intraarticular extension of right ulna, initial encounter for closed fracture: Secondary | ICD-10-CM | POA: Insufficient documentation

## 2015-10-01 DIAGNOSIS — S52021D Displaced fracture of olecranon process without intraarticular extension of right ulna, subsequent encounter for closed fracture with routine healing: Secondary | ICD-10-CM

## 2015-10-01 DIAGNOSIS — I252 Old myocardial infarction: Secondary | ICD-10-CM | POA: Insufficient documentation

## 2015-10-01 DIAGNOSIS — R002 Palpitations: Secondary | ICD-10-CM | POA: Insufficient documentation

## 2015-10-01 DIAGNOSIS — I2510 Atherosclerotic heart disease of native coronary artery without angina pectoris: Secondary | ICD-10-CM | POA: Diagnosis not present

## 2015-10-01 DIAGNOSIS — F419 Anxiety disorder, unspecified: Secondary | ICD-10-CM | POA: Insufficient documentation

## 2015-10-01 DIAGNOSIS — Z888 Allergy status to other drugs, medicaments and biological substances status: Secondary | ICD-10-CM | POA: Insufficient documentation

## 2015-10-01 DIAGNOSIS — Z955 Presence of coronary angioplasty implant and graft: Secondary | ICD-10-CM | POA: Diagnosis not present

## 2015-10-01 DIAGNOSIS — Z9842 Cataract extraction status, left eye: Secondary | ICD-10-CM | POA: Diagnosis not present

## 2015-10-01 DIAGNOSIS — Z9841 Cataract extraction status, right eye: Secondary | ICD-10-CM | POA: Insufficient documentation

## 2015-10-01 DIAGNOSIS — H9190 Unspecified hearing loss, unspecified ear: Secondary | ICD-10-CM | POA: Diagnosis not present

## 2015-10-01 DIAGNOSIS — S52009A Unspecified fracture of upper end of unspecified ulna, initial encounter for closed fracture: Secondary | ICD-10-CM | POA: Diagnosis present

## 2015-10-01 DIAGNOSIS — Z79899 Other long term (current) drug therapy: Secondary | ICD-10-CM | POA: Insufficient documentation

## 2015-10-01 DIAGNOSIS — Y9301 Activity, walking, marching and hiking: Secondary | ICD-10-CM | POA: Diagnosis not present

## 2015-10-01 DIAGNOSIS — Y92214 College as the place of occurrence of the external cause: Secondary | ICD-10-CM | POA: Insufficient documentation

## 2015-10-01 DIAGNOSIS — W1839XA Other fall on same level, initial encounter: Secondary | ICD-10-CM | POA: Diagnosis not present

## 2015-10-01 DIAGNOSIS — Z853 Personal history of malignant neoplasm of breast: Secondary | ICD-10-CM | POA: Diagnosis not present

## 2015-10-01 DIAGNOSIS — Z9049 Acquired absence of other specified parts of digestive tract: Secondary | ICD-10-CM | POA: Diagnosis not present

## 2015-10-01 DIAGNOSIS — K219 Gastro-esophageal reflux disease without esophagitis: Secondary | ICD-10-CM | POA: Diagnosis not present

## 2015-10-01 DIAGNOSIS — I10 Essential (primary) hypertension: Secondary | ICD-10-CM | POA: Diagnosis not present

## 2015-10-01 DIAGNOSIS — Z8249 Family history of ischemic heart disease and other diseases of the circulatory system: Secondary | ICD-10-CM | POA: Insufficient documentation

## 2015-10-01 HISTORY — PX: ORIF ELBOW FRACTURE: SHX5031

## 2015-10-01 SURGERY — OPEN REDUCTION INTERNAL FIXATION (ORIF) ELBOW/OLECRANON FRACTURE
Anesthesia: General | Laterality: Right | Wound class: Clean

## 2015-10-01 MED ORDER — CEFAZOLIN SODIUM-DEXTROSE 2-4 GM/100ML-% IV SOLN
INTRAVENOUS | Status: AC
  Filled 2015-10-01: qty 100

## 2015-10-01 MED ORDER — MIDAZOLAM HCL 2 MG/2ML IJ SOLN
INTRAMUSCULAR | Status: DC | PRN
  Administered 2015-10-01: 1 mg via INTRAVENOUS

## 2015-10-01 MED ORDER — SODIUM CHLORIDE 0.9 % IV SOLN: INTRAVENOUS | Status: DC

## 2015-10-01 MED ORDER — METOCLOPRAMIDE HCL 10 MG PO TABS: 5.0000 mg | ORAL_TABLET | Freq: Three times a day (TID) | ORAL | Status: DC | PRN

## 2015-10-01 MED ORDER — ONDANSETRON HCL 4 MG/2ML IJ SOLN: 4.0000 mg | Freq: Once | INTRAMUSCULAR | Status: DC | PRN

## 2015-10-01 MED ORDER — BUPIVACAINE HCL (PF) 0.25 % IJ SOLN
INTRAMUSCULAR | Status: AC
  Filled 2015-10-01: qty 30

## 2015-10-01 MED ORDER — ONDANSETRON HCL 4 MG PO TABS: 4.0000 mg | ORAL_TABLET | Freq: Four times a day (QID) | ORAL | Status: DC | PRN

## 2015-10-01 MED ORDER — FENTANYL CITRATE (PF) 100 MCG/2ML IJ SOLN
INTRAMUSCULAR | Status: DC | PRN
  Administered 2015-10-01 (×2): 50 ug via INTRAVENOUS

## 2015-10-01 MED ORDER — NEOMYCIN-POLYMYXIN B GU 40-200000 IR SOLN
Status: AC
  Filled 2015-10-01: qty 2

## 2015-10-01 MED ORDER — FAMOTIDINE 20 MG PO TABS
20.0000 mg | ORAL_TABLET | Freq: Once | ORAL | Status: AC
  Administered 2015-10-01: 20 mg via ORAL

## 2015-10-01 MED ORDER — HYDROCODONE-ACETAMINOPHEN 5-325 MG PO TABS: 1.0000 | ORAL_TABLET | ORAL | Status: DC | PRN

## 2015-10-01 MED ORDER — CARVEDILOL 12.5 MG PO TABS
ORAL_TABLET | ORAL | Status: AC
  Administered 2015-10-01: 6.25 mg via ORAL
  Filled 2015-10-01: qty 1

## 2015-10-01 MED ORDER — PHENYLEPHRINE HCL 10 MG/ML IJ SOLN
INTRAMUSCULAR | Status: DC | PRN
  Administered 2015-10-01 (×2): 50 ug via INTRAVENOUS

## 2015-10-01 MED ORDER — CEFAZOLIN SODIUM-DEXTROSE 2-4 GM/100ML-% IV SOLN
2.0000 g | Freq: Once | INTRAVENOUS | Status: AC
  Administered 2015-10-01: 2 g via INTRAVENOUS

## 2015-10-01 MED ORDER — LACTATED RINGERS IV SOLN
INTRAVENOUS | Status: DC
  Administered 2015-10-01: 12:00:00 via INTRAVENOUS

## 2015-10-01 MED ORDER — ONDANSETRON HCL 4 MG/2ML IJ SOLN: 4.0000 mg | Freq: Four times a day (QID) | INTRAMUSCULAR | Status: DC | PRN

## 2015-10-01 MED ORDER — ONDANSETRON HCL 4 MG/2ML IJ SOLN
INTRAMUSCULAR | Status: DC | PRN
  Administered 2015-10-01: 4 mg via INTRAVENOUS

## 2015-10-01 MED ORDER — NEOMYCIN-POLYMYXIN B GU 40-200000 IR SOLN
Status: DC | PRN
  Administered 2015-10-01: 2 mL

## 2015-10-01 MED ORDER — ACETAMINOPHEN 10 MG/ML IV SOLN
INTRAVENOUS | Status: AC
  Filled 2015-10-01: qty 100

## 2015-10-01 MED ORDER — ACETAMINOPHEN 10 MG/ML IV SOLN
INTRAVENOUS | Status: DC | PRN
  Administered 2015-10-01: 1000 mg via INTRAVENOUS

## 2015-10-01 MED ORDER — EPHEDRINE SULFATE 50 MG/ML IJ SOLN
INTRAMUSCULAR | Status: DC | PRN
  Administered 2015-10-01 (×2): 10 mg via INTRAVENOUS

## 2015-10-01 MED ORDER — FENTANYL CITRATE (PF) 100 MCG/2ML IJ SOLN
INTRAMUSCULAR | Status: AC
  Filled 2015-10-01: qty 2

## 2015-10-01 MED ORDER — CARVEDILOL 12.5 MG PO TABS
6.2500 mg | ORAL_TABLET | Freq: Once | ORAL | Status: AC
  Administered 2015-10-01: 6.25 mg via ORAL

## 2015-10-01 MED ORDER — PROPOFOL 10 MG/ML IV BOLUS
INTRAVENOUS | Status: DC | PRN
  Administered 2015-10-01: 120 mg via INTRAVENOUS

## 2015-10-01 MED ORDER — FAMOTIDINE 20 MG PO TABS
ORAL_TABLET | ORAL | Status: AC
  Administered 2015-10-01: 20 mg via ORAL
  Filled 2015-10-01: qty 1

## 2015-10-01 MED ORDER — HYDROCODONE-ACETAMINOPHEN 5-325 MG PO TABS: 1.0000 | ORAL_TABLET | Freq: Four times a day (QID) | ORAL | Status: DC | PRN

## 2015-10-01 MED ORDER — FENTANYL CITRATE (PF) 100 MCG/2ML IJ SOLN
25.0000 ug | INTRAMUSCULAR | Status: AC | PRN
  Administered 2015-10-01 (×6): 25 ug via INTRAVENOUS

## 2015-10-01 MED ORDER — METOCLOPRAMIDE HCL 5 MG/ML IJ SOLN: 5.0000 mg | Freq: Three times a day (TID) | INTRAMUSCULAR | Status: DC | PRN

## 2015-10-01 MED ORDER — GLYCOPYRROLATE 0.2 MG/ML IJ SOLN
INTRAMUSCULAR | Status: DC | PRN
  Administered 2015-10-01: 0.2 mg via INTRAVENOUS

## 2015-10-01 SURGICAL SUPPLY — 53 items
BANDAGE ELASTIC 4 LF NS (GAUZE/BANDAGES/DRESSINGS) ×4 IMPLANT
BIT DRILL 2.5X2.75 QC CALB (BIT) ×2 IMPLANT
BNDG COHESIVE 4X5 TAN STRL (GAUZE/BANDAGES/DRESSINGS) ×2 IMPLANT
BNDG ESMARK 4X12 TAN STRL LF (GAUZE/BANDAGES/DRESSINGS) ×2 IMPLANT
CANISTER SUCT 1200ML W/VALVE (MISCELLANEOUS) ×2 IMPLANT
CHLORAPREP W/TINT 26ML (MISCELLANEOUS) ×2 IMPLANT
CUFF TOURN SGL QUICK 18 (TOURNIQUET CUFF) IMPLANT
CUFF TOURN SGL QUICK 24 (TOURNIQUET CUFF)
CUFF TRNQT CYL 24X4X40X1 (TOURNIQUET CUFF) IMPLANT
DRAPE C-ARM XRAY 36X54 (DRAPES) ×2 IMPLANT
DRAPE SHEET LG 3/4 BI-LAMINATE (DRAPES) ×2 IMPLANT
ELECT CAUTERY BLADE 6.4 (BLADE) ×2 IMPLANT
ELECT REM PT RETURN 9FT ADLT (ELECTROSURGICAL) ×2
ELECTRODE REM PT RTRN 9FT ADLT (ELECTROSURGICAL) ×1 IMPLANT
GAUZE PETRO XEROFOAM 1X8 (MISCELLANEOUS) ×2 IMPLANT
GAUZE SPONGE 4X4 12PLY STRL (GAUZE/BANDAGES/DRESSINGS) ×2 IMPLANT
GLOVE BIOGEL PI IND STRL 9 (GLOVE) ×1 IMPLANT
GLOVE BIOGEL PI INDICATOR 9 (GLOVE) ×1
GLOVE SURG ORTHO 9.0 STRL STRW (GLOVE) ×2 IMPLANT
GOWN SPECIALTY ULTRA XL (MISCELLANEOUS) ×2 IMPLANT
GOWN STRL REUS W/ TWL LRG LVL3 (GOWN DISPOSABLE) ×1 IMPLANT
GOWN STRL REUS W/TWL LRG LVL3 (GOWN DISPOSABLE) ×1
K-WIRE ACE 1.6X6 (WIRE) ×2
KIT RM TURNOVER STRD PROC AR (KITS) ×2 IMPLANT
KWIRE ACE 1.6X6 (WIRE) ×1 IMPLANT
NEEDLE FILTER BLUNT 18X 1/2SAF (NEEDLE) ×1
NEEDLE FILTER BLUNT 18X1 1/2 (NEEDLE) ×1 IMPLANT
NS IRRIG 500ML POUR BTL (IV SOLUTION) ×2 IMPLANT
PACK EXTREMITY ARMC (MISCELLANEOUS) ×2 IMPLANT
PAD ABD DERMACEA PRESS 5X9 (GAUZE/BANDAGES/DRESSINGS) ×4 IMPLANT
PAD CAST CTTN 4X4 STRL (SOFTGOODS) ×3 IMPLANT
PAD PREP 24X41 OB/GYN DISP (PERSONAL CARE ITEMS) ×2 IMPLANT
PADDING CAST COTTON 4X4 STRL (SOFTGOODS) ×3
PLATE OLECRANON SM (Plate) ×2 IMPLANT
SCREW LOCK CORT STAR 3.5X10 (Screw) ×4 IMPLANT
SCREW LOCK CORT STAR 3.5X18 (Screw) ×4 IMPLANT
SCREW LOCK CORT STAR 3.5X36 (Screw) ×2 IMPLANT
SCREW LOW PROFILE 18MMX3.5MM (Screw) ×2 IMPLANT
SPLINT CAST 1 STEP 3X12 (MISCELLANEOUS) ×4 IMPLANT
SPLINT CAST 1 STEP 5X30 WHT (MISCELLANEOUS) ×2 IMPLANT
SPONGE LAP 18X18 5 PK (GAUZE/BANDAGES/DRESSINGS) ×2 IMPLANT
STAPLER SKIN PROX 35W (STAPLE) ×2 IMPLANT
STOCKINETTE IMPERVIOUS 9X36 MD (GAUZE/BANDAGES/DRESSINGS) ×2 IMPLANT
STRIP CLOSURE SKIN 1/2X4 (GAUZE/BANDAGES/DRESSINGS) ×2 IMPLANT
SUT ETHILON 3-0 FS-10 30 BLK (SUTURE) ×2
SUT TICRON 2-0 30IN 311381 (SUTURE) ×4 IMPLANT
SUT VIC AB 0 CT2 27 (SUTURE) ×2 IMPLANT
SUT VIC AB 2-0 CT2 27 (SUTURE) ×2 IMPLANT
SUT VIC AB 3-0 SH 27 (SUTURE) ×1
SUT VIC AB 3-0 SH 27X BRD (SUTURE) ×1 IMPLANT
SUTURE EHLN 3-0 FS-10 30 BLK (SUTURE) ×1 IMPLANT
SYR 5ML LL (SYRINGE) ×2 IMPLANT
WASHER 3.5MM (Orthopedic Implant) ×2 IMPLANT

## 2015-10-01 NOTE — Op Note (Signed)
10/01/2015  2:11 PM  PATIENT:  Sherri [name]  82 y.o. female  PRE-OPERATIVE DIAGNOSIS:  right elbow fracture olecranon  POST-OPERATIVE DIAGNOSIS:  right elbow fracture  PROCEDURE:  Procedure(s): OPEN REDUCTION INTERNAL FIXATION (ORIF) ELBOW/OLECRANON FRACTURE (Right)  SURGEON: Laurene Footman, MD  ASSISTANTS: None  ANESTHESIA:   general  EBL:  Total I/O In: 400 [I.V.:400] Out: 50 [Blood:50]  BLOOD ADMINISTERED:none  DRAINS: none   LOCAL MEDICATIONS USED:  NONE  SPECIMEN:  No Specimen  DISPOSITION OF SPECIMEN:  N/A  COUNTS:  YES  TOURNIQUET:    IMPLANTS: Biomet locking olecranon plate and [name]  DICTATION: .Dragon Dictation patient brought the operating room and after adequate general anesthesia was obtained the right arm was prepped and draped in sterile fashion with no tourniquet required. There was an abrasion to the radial side of the olecranon and so a curvilinear incision was made towards the ulnar side care being taken not to dissect down to the ulnar nerve of the fracture hematoma was evacuated and the small distal fragment identified. The 2 #2 Ethibond sutures were placed through the tendon tube and this allowed for reduction of the fragment by pulling on these 2 sutures plate was then applied to the shaft with a K wire through the end and this held the fracture in a reduced position with one screw laced to the shaft of the plate. C-arm was brought in and there is near anatomic alignment. The 2 proximal screw holes were filled with locking [name] and then 2 additional locking [name] on the shaft the suture that had been placed around the tendon proximal to the fragment was passed through the most distal screw hole prior to placing the plate and the sutures were then tied helping and a tension band technique the initial K wires then removed and one distal shaft [name] placed down the center of the olecranon and proximal ulna C-arm was brought in and with range of motion  the fracture appeared stable with essentially anatomic alignment of the comminuted intra-articular fragment the wound was irrigated and closed wound with 3-0 Vicryl subcutaneously and skin staples Xeroform 4 x 4's web roll medial and lateral splints with the elbow at 90 followed by an Ace wrap  PLAN OF CARE: Discharge to home after PACU  PATIENT DISPOSITION:  PACU - hemodynamically stable.

## 2015-10-01 NOTE — H&P (Signed)
Reviewed paper H+P, will be scanned into chart. No changes noted.  

## 2015-10-01 NOTE — Anesthesia Preprocedure Evaluation (Addendum)
Anesthesia Evaluation  Patient identified by MRN, date of birth, ID band Patient awake    Reviewed: Allergy & Precautions, NPO status , Patient's Chart, lab work & pertinent test results, reviewed documented beta blocker date and time   Airway Mallampati: II  TM Distance: >3 FB     Dental  (+) Chipped   Pulmonary           Cardiovascular hypertension, Pt. on medications and Pt. on home beta blockers + CAD, + Past MI and + Cardiac Stents  + dysrhythmias      Neuro/Psych Anxiety    GI/Hepatic GERD  Controlled,  Endo/Other    Renal/GU      Musculoskeletal   Abdominal   Peds  Hematology   Anesthesia Other Findings MI in 2007. Stent in 2007.  Reproductive/Obstetrics                            Anesthesia Physical Anesthesia Plan  ASA: III  Anesthesia Plan: General   Post-op Pain Management:    Induction: Intravenous  Airway Management Planned: LMA  Additional Equipment:   Intra-op Plan:   Post-operative Plan:   Informed Consent: I have reviewed the patients History and Physical, chart, labs and discussed the procedure including the risks, benefits and alternatives for the proposed anesthesia with the patient or authorized representative who has indicated his/her understanding and acceptance.     Plan Discussed with: CRNA  Anesthesia Plan Comments:         Anesthesia Quick Evaluation

## 2015-10-01 NOTE — Anesthesia Postprocedure Evaluation (Signed)
Anesthesia Post Note  Patient: Sherri [name]  Procedure(s) Performed: Procedure(s) (LRB): OPEN REDUCTION INTERNAL FIXATION (ORIF) ELBOW/OLECRANON FRACTURE (Right)  Patient location during evaluation: PACU Anesthesia Type: General Level of consciousness: awake and alert Pain management: pain level controlled Vital Signs Assessment: post-procedure vital signs reviewed and stable Respiratory status: spontaneous breathing, nonlabored ventilation, respiratory function stable and patient connected to nasal cannula oxygen Cardiovascular status: blood pressure returned to baseline and stable Postop Assessment: no signs of nausea or vomiting Anesthetic complications: no    Last Vitals:  Filed Vitals:   10/01/15 1533 10/01/15 1631  BP: 135/56 127/55  Pulse: 55 59  Temp:    Resp: 17 16    Last Pain:  Filed Vitals:   10/01/15 1632  PainSc: 2                  Akshara Blumenthal S

## 2015-10-01 NOTE — Discharge Instructions (Signed)
AMBULATORY SURGERY  °DISCHARGE INSTRUCTIONS ° ° °1) The drugs that you were given will stay in your system until tomorrow so for the next 24 hours you should not: ° °A) Drive an automobile °B) Make any legal decisions °C) Drink any alcoholic beverage ° ° °2) You may resume regular meals tomorrow.  Today it is better to start with liquids and gradually work up to solid foods. ° °You may eat anything you prefer, but it is better to start with liquids, then soup and crackers, and gradually work up to solid foods. ° ° °3) Please notify your doctor immediately if you have any unusual bleeding, trouble breathing, redness and pain at the surgery site, drainage, fever, or pain not relieved by medication. ° ° ° °4) Additional Instructions: ° ° ° ° ° ° ° °Please contact your physician with any problems or Same Day Surgery at [phone], Monday through Friday 6 am to 4 pm, or [place] at [place] Main number at [phone]. °

## 2015-10-01 NOTE — Anesthesia Procedure Notes (Signed)
Procedure Name: LMA Insertion Date/Time: 10/01/2015 12:59 PM Performed by: Jonna Clark Pre-anesthesia Checklist: Patient identified, Patient being monitored, Timeout performed, Emergency Drugs available and Suction available Patient Re-evaluated:Patient Re-evaluated prior to inductionOxygen Delivery Method: Circle system utilized Preoxygenation: Pre-oxygenation with 100% oxygen Intubation Type: IV induction Ventilation: Mask ventilation without difficulty LMA: LMA inserted LMA Size: 3.5 Tube type: Oral Number of attempts: 1 Placement Confirmation: positive ETCO2 and breath sounds checked- equal and bilateral Tube secured with: Tape Dental Injury: Teeth and Oropharynx as per pre-operative assessment

## 2015-10-01 NOTE — Transfer of Care (Signed)
Immediate Anesthesia Transfer of Care Note  Patient: Sherri [name]  Procedure(s) Performed: Procedure(s): OPEN REDUCTION INTERNAL FIXATION (ORIF) ELBOW/OLECRANON FRACTURE (Right)  Patient Location: PACU  Anesthesia Type:General  Level of Consciousness: sedated and responds to stimulation  Airway & Oxygen Therapy: Patient Spontanous Breathing and Patient connected to face mask oxygen  Post-op Assessment: Report given to RN and Post -op Vital signs reviewed and stable  Post vital signs: Reviewed and stable  Last Vitals:  Filed Vitals:   10/01/15 1415 10/01/15 1417  BP: 104/50 104/50  Pulse: 61 61  Temp:  36.2 C  Resp: 20 15    Last Pain: There were no vitals filed for this visit.       Complications: No apparent anesthesia complications

## 2015-10-02 ENCOUNTER — Encounter: Payer: Self-pay | Admitting: Orthopedic Surgery

## 2015-12-09 ENCOUNTER — Inpatient Hospital Stay: Payer: Medicare PPO | Attending: Oncology | Admitting: Oncology

## 2015-12-09 ENCOUNTER — Encounter: Payer: Self-pay | Admitting: Oncology

## 2015-12-09 VITALS — BP 133/71 | HR 59 | Temp 97.3°F | Wt 139.9 lb

## 2015-12-09 DIAGNOSIS — Z955 Presence of coronary angioplasty implant and graft: Secondary | ICD-10-CM | POA: Diagnosis not present

## 2015-12-09 DIAGNOSIS — K219 Gastro-esophageal reflux disease without esophagitis: Secondary | ICD-10-CM | POA: Diagnosis not present

## 2015-12-09 DIAGNOSIS — Z85828 Personal history of other malignant neoplasm of skin: Secondary | ICD-10-CM | POA: Diagnosis not present

## 2015-12-09 DIAGNOSIS — Z923 Personal history of irradiation: Secondary | ICD-10-CM

## 2015-12-09 DIAGNOSIS — Z79899 Other long term (current) drug therapy: Secondary | ICD-10-CM | POA: Diagnosis not present

## 2015-12-09 DIAGNOSIS — I10 Essential (primary) hypertension: Secondary | ICD-10-CM | POA: Diagnosis not present

## 2015-12-09 DIAGNOSIS — M8580 Other specified disorders of bone density and structure, unspecified site: Secondary | ICD-10-CM | POA: Diagnosis not present

## 2015-12-09 DIAGNOSIS — Z7982 Long term (current) use of aspirin: Secondary | ICD-10-CM | POA: Diagnosis not present

## 2015-12-09 DIAGNOSIS — E7800 Pure hypercholesterolemia, unspecified: Secondary | ICD-10-CM | POA: Diagnosis not present

## 2015-12-09 DIAGNOSIS — F419 Anxiety disorder, unspecified: Secondary | ICD-10-CM | POA: Diagnosis not present

## 2015-12-09 DIAGNOSIS — C50211 Malignant neoplasm of upper-inner quadrant of right female breast: Secondary | ICD-10-CM

## 2015-12-09 DIAGNOSIS — I2510 Atherosclerotic heart disease of native coronary artery without angina pectoris: Secondary | ICD-10-CM | POA: Insufficient documentation

## 2015-12-09 DIAGNOSIS — Z170 Estrogen receptor positive status [ER+]: Secondary | ICD-10-CM | POA: Diagnosis not present

## 2015-12-09 DIAGNOSIS — Z79811 Long term (current) use of aromatase inhibitors: Secondary | ICD-10-CM

## 2015-12-09 DIAGNOSIS — I252 Old myocardial infarction: Secondary | ICD-10-CM | POA: Insufficient documentation

## 2015-12-09 NOTE — Progress Notes (Signed)
Patient ambulates without assistance, brought to exam room 7.  Patient c/o chronic right arm pain 3 oof 10.  Vitals documented, medicaton record updated information provided by patient.

## 2015-12-09 NOTE — Progress Notes (Addendum)
[place]  Telephone:([phone]) [phone] Fax:([phone]) [phone]  ID: Henrine Screws OB: [birth year]  MR#: [id number]  [id number]  Patient Care Team: No Pcp Per Patient as PCP - General (General Practice)  CHIEF COMPLAINT: Stage IIb ER/PR positive, upper inner quadrant right breast.  INTERVAL HISTORY: Patient is an 82 year old female who was diagnosed with a right-sided breast cancer in 2013. She underwent 6 months of neoadjuvant aromatase inhibitor treatment starting in April 2013. She subsequently underwent lumpectomy in October 2013. This was followed by adjuvant XRT. Patient did not have any chemotherapy. She has been it tolerating Arimidex well without significant side effects. Her only complaint is of right arm pain secondary to a recent elbow fracture. She has no neurological complaints. She denies any recent fevers or illnesses. She denies any chest pain or shortness of breath. She has a good appetite and denies weight loss. She has no nausea, vomiting, constipation, or diarrhea. She has no urinary complaints. Patient otherwise feels well and offers no further specific complaints.   REVIEW OF SYSTEMS:   Review of Systems  Constitutional: Negative for fever, weight loss, malaise/fatigue and diaphoresis.  Respiratory: Negative.  Negative for cough and shortness of breath.   Cardiovascular: Negative.  Negative for chest pain.  Gastrointestinal: Negative.  Negative for abdominal pain.  Genitourinary: Negative.   Musculoskeletal: Positive for joint pain.  Neurological: Negative.  Negative for sensory change and weakness.  Psychiatric/Behavioral: Negative.     As per HPI. Otherwise, a complete review of systems is negatve.  PAST MEDICAL HISTORY: Past Medical History  Diagnosis Date  . Coronary artery disease   . History of coronary artery stent placement 2007    one stent  . Hypertension   . HOH (hard of hearing)   . Hypercholesteremia   . Osteopenia   .  Diverticulitis   . Myocardial infarction ([place])     2007  . Anxiety   . Bursitis of elbow     RIGHT  . Palpitations   . Dysrhythmia     according to anxiety, tachy  . Cancer [facility]) 2013    right breast lumpectomy with a few nodes removed  . GERD (gastroesophageal reflux disease)     okay since took her gallbladder out  . Heart disease   . Breast cancer ([place])   . Skin cancer   . S/P appendectomy   . H/O lumpectomy     PAST SURGICAL HISTORY: Past Surgical History  Procedure Laterality Date  . Cholecystectomy    . Breast lumpectomy    . Colonoscopy    . Cataract extraction w/phaco Right 07/23/2015    Procedure: CATARACT EXTRACTION PHACO AND INTRAOCULAR LENS PLACEMENT (IOC);  Surgeon: Birder Robson, MD;  Location: [facility] ORS;  Service: Ophthalmology;  Laterality: Right;  [place] 01:24 AP% 26.7 CDE 22.47 fluid pack lot # [id number]  . Coronary angioplasty  2007  . Cataract extraction w/phaco Left 08/13/2015    Procedure: CATARACT EXTRACTION PHACO AND INTRAOCULAR LENS PLACEMENT (IOC);  Surgeon: Birder Robson, MD;  Location: [facility] ORS;  Service: Ophthalmology;  Laterality: Left;  [place] 02:40 AP% 30.7 CDE 49.28 fluid pack lot # [id number]  . Orif elbow fracture Right 10/01/2015    Procedure: OPEN REDUCTION INTERNAL FIXATION (ORIF) ELBOW/OLECRANON FRACTURE;  Surgeon: Hessie Knows, MD;  Location: [facility] ORS;  Service: Orthopedics;  Laterality: Right;    FAMILY HISTORY: Reviewed and unchanged. No reported history of malignancy or chronic disease.     ADVANCED DIRECTIVES:    HEALTH MAINTENANCE: Social History  Substance Use Topics  . Smoking status: Never Smoker   . Smokeless tobacco: Not on file  . Alcohol Use: Yes     Comment: wine occasionally     Colonoscopy:  PAP:  Bone density:  Lipid panel:  Allergies  Allergen Reactions  . Prednisone Other (See Comments)    Mood alterations    Current Outpatient Prescriptions  Medication Sig Dispense Refill  . alendronate (FOSAMAX) 70  MG tablet Take 70 mg by mouth once a week. Take with a full glass of water on an empty stomach.    Marland Kitchen anastrozole (ARIMIDEX) 1 MG tablet Take 1 mg by mouth daily.    Marland Kitchen aspirin 81 MG tablet Take 81 mg by mouth at bedtime.    Marland Kitchen aspirin 81 MG tablet Take 81 mg by mouth daily.    Marland Kitchen CALCIUM CITRATE PO Take by mouth.    . carvedilol (COREG) 6.25 MG tablet Take 6.25 mg by mouth 2 (two) times daily.    . cephALEXin (KEFLEX) 500 MG capsule Take 500 mg by mouth.    . Cholecalciferol (VITAMIN D-3 PO) Take 1,800 mg by mouth at bedtime.    . Coenzyme Q10 (CO Q 10 PO) Take 100 mg by mouth daily.    Marland Kitchen lisinopril (PRINIVIL,ZESTRIL) 10 MG tablet Take 5 mg by mouth 2 (two) times daily.    . simvastatin (ZOCOR) 40 MG tablet Take 40 mg by mouth at bedtime.    . vitamin B-12 (CYANOCOBALAMIN) 1000 MCG tablet Take 1,000 mcg by mouth daily.    . traMADol (ULTRAM) 50 MG tablet Take 1 tablet (50 mg total) by mouth every 6 (six) hours as needed. (Patient not taking: Reported on 10/01/2015) 12 tablet 0   No current facility-administered medications for this visit.    OBJECTIVE: Filed Vitals:   12/09/15 0908  BP: 133/71  Pulse: 59  Temp: 97.3 F (36.3 C)     Body mass index is 24.79 kg/(m^2).    ECOG FS:0 - Asymptomatic  General: Well-developed, well-nourished, no acute distress. Eyes: Pink conjunctiva, anicteric sclera. HEENT: Normocephalic, moist mucous membranes, clear oropharnyx. Breasts: Patient requested exam be deferred today. Lungs: Clear to auscultation bilaterally. Heart: Regular rate and rhythm. No rubs, murmurs, or gallops. Abdomen: Soft, nontender, nondistended. No organomegaly noted, normoactive bowel sounds. Musculoskeletal: No edema, cyanosis, or clubbing. Neuro: Alert, answering all questions appropriately. Cranial nerves grossly intact. Skin: No rashes or petechiae noted. Psych: Normal affect. Lymphatics: No cervical, calvicular, axillary or inguinal LAD.   LAB RESULTS:  No results found  for: NA, K, CL, CO2, GLUCOSE, BUN, CREATININE, CALCIUM, PROT, ALBUMIN, AST, ALT, ALKPHOS, BILITOT, GFRNONAA, GFRAA  No results found for: WBC, NEUTROABS, HGB, HCT, MCV, PLT   STUDIES: No results found.  ASSESSMENT: Stage IIb ER/PR positive, upper inner quadrant right breast.  PLAN:    1. Stage IIb ER/PR positive, upper inner quadrant right breast: Although we do not have access to patient's pathology report, her stage and location of breast were determined based on clinic notes. AJCC staging is also unavailable. Patient received 6 months of Arimidex between April 2013 and October 2013 prior to undergoing right breast lumpectomy. She reports she underwent adjuvant XRT, but did not receive adjuvant chemotherapy. Her most recent mammogram approximately one year ago was reported as no evidence of disease. Continue Arimidex for total 5 years completing in October 2018. Patient will require bone marrow density and a mammogram in the next 1-2 weeks. Return to clinic in 6 months for routine  evaluation.  Approximate 45 minutes was spent in discussion of which greater than 50% was consultation.  Patient expressed understanding and was in agreement with this plan. She also understands that She can call clinic at any time with any questions, concerns, or complaints.    Lloyd Huger, MD   12/09/2015 9:27 AM

## 2015-12-13 ENCOUNTER — Inpatient Hospital Stay
Admission: RE | Admit: 2015-12-13 | Discharge: 2015-12-13 | Disposition: A | Discharge status: Home / Self Care | Payer: Self-pay | Source: Ambulatory Visit | Attending: *Deleted | Admitting: *Deleted

## 2015-12-13 ENCOUNTER — Other Ambulatory Visit: Payer: Self-pay | Admitting: *Deleted

## 2015-12-13 DIAGNOSIS — Z9289 Personal history of other medical treatment: Secondary | ICD-10-CM

## 2016-01-01 ENCOUNTER — Other Ambulatory Visit: Payer: Medicare PPO

## 2016-01-02 ENCOUNTER — Other Ambulatory Visit: Payer: Self-pay | Admitting: *Deleted

## 2016-01-02 MED ORDER — ANASTROZOLE 1 MG PO TABS: 1.0000 mg | ORAL_TABLET | Freq: Every day | ORAL | 1 refills | Status: DC

## 2016-01-03 ENCOUNTER — Telehealth: Payer: Self-pay | Admitting: *Deleted

## 2016-01-03 NOTE — Telephone Encounter (Signed)
RX was sent by Hassan Rowan yesterday. Thank you.

## 2016-01-03 NOTE — Telephone Encounter (Signed)
Patient came by clinic to request refill for Anastrozole.

## 2016-01-06 ENCOUNTER — Ambulatory Visit: Payer: Medicare PPO

## 2016-01-06 ENCOUNTER — Other Ambulatory Visit: Payer: Medicare PPO

## 2016-01-07 ENCOUNTER — Ambulatory Visit: Admission: RE | Admit: 2016-01-07 | Payer: Medicare PPO | Source: Ambulatory Visit | Admitting: Orthopedic Surgery

## 2016-01-07 ENCOUNTER — Encounter: Admission: RE | Payer: Self-pay | Source: Ambulatory Visit

## 2016-01-07 SURGERY — REMOVAL, HARDWARE
Anesthesia: Choice | Laterality: Right

## 2016-01-08 NOTE — Telephone Encounter (Signed)
Erroneous error

## 2016-01-15 ENCOUNTER — Ambulatory Visit
Admission: RE | Admit: 2016-01-15 | Discharge: 2016-01-15 | Disposition: A | Discharge status: Home / Self Care | Payer: Medicare PPO | Source: Ambulatory Visit | Attending: Oncology | Admitting: Oncology

## 2016-01-15 DIAGNOSIS — C50211 Malignant neoplasm of upper-inner quadrant of right female breast: Secondary | ICD-10-CM | POA: Diagnosis present

## 2016-01-15 DIAGNOSIS — R921 Mammographic calcification found on diagnostic imaging of breast: Secondary | ICD-10-CM | POA: Diagnosis not present

## 2016-01-15 DIAGNOSIS — Z780 Asymptomatic menopausal state: Secondary | ICD-10-CM | POA: Diagnosis not present

## 2016-01-15 DIAGNOSIS — M85852 Other specified disorders of bone density and structure, left thigh: Secondary | ICD-10-CM | POA: Diagnosis not present

## 2016-01-15 DIAGNOSIS — C50512 Malignant neoplasm of lower-outer quadrant of left female breast: Secondary | ICD-10-CM | POA: Insufficient documentation

## 2016-01-15 DIAGNOSIS — M8588 Other specified disorders of bone density and structure, other site: Secondary | ICD-10-CM | POA: Insufficient documentation

## 2016-01-24 DIAGNOSIS — C50911 Malignant neoplasm of unspecified site of right female breast: Secondary | ICD-10-CM | POA: Insufficient documentation

## 2016-01-24 DIAGNOSIS — K219 Gastro-esophageal reflux disease without esophagitis: Secondary | ICD-10-CM | POA: Insufficient documentation

## 2016-01-24 DIAGNOSIS — M810 Age-related osteoporosis without current pathological fracture: Secondary | ICD-10-CM | POA: Insufficient documentation

## 2016-01-24 DIAGNOSIS — E538 Deficiency of other specified B group vitamins: Secondary | ICD-10-CM | POA: Insufficient documentation

## 2016-01-24 DIAGNOSIS — E7800 Pure hypercholesterolemia, unspecified: Secondary | ICD-10-CM | POA: Insufficient documentation

## 2016-01-24 DIAGNOSIS — I10 Essential (primary) hypertension: Secondary | ICD-10-CM | POA: Insufficient documentation

## 2016-03-06 ENCOUNTER — Telehealth: Payer: Self-pay | Admitting: Oncology

## 2016-03-06 ENCOUNTER — Other Ambulatory Visit: Payer: Self-pay | Admitting: *Deleted

## 2016-03-06 DIAGNOSIS — Z853 Personal history of malignant neoplasm of breast: Secondary | ICD-10-CM

## 2016-03-06 NOTE — Telephone Encounter (Signed)
Patient needs to be scheduled for 6 month follow up mammogram in February 2018, orders have been entered. Thanks so much.

## 2016-03-06 NOTE — Telephone Encounter (Signed)
Please call pt about [facility] appt. She would like a 6 month follow up. If she is talking about a mammogram, if you place the order I will be happy to schedule.

## 2016-03-09 DIAGNOSIS — I214 Non-ST elevation (NSTEMI) myocardial infarction: Secondary | ICD-10-CM | POA: Insufficient documentation

## 2016-03-09 DIAGNOSIS — I2510 Atherosclerotic heart disease of native coronary artery without angina pectoris: Secondary | ICD-10-CM | POA: Insufficient documentation

## 2016-04-06 DIAGNOSIS — I340 Nonrheumatic mitral (valve) insufficiency: Secondary | ICD-10-CM | POA: Insufficient documentation

## 2016-04-28 ENCOUNTER — Encounter
Admission: RE | Admit: 2016-04-28 | Discharge: 2016-04-28 | Disposition: A | Discharge status: Home / Self Care | Payer: Medicare PPO | Source: Ambulatory Visit | Attending: Orthopedic Surgery | Admitting: Orthopedic Surgery

## 2016-04-28 DIAGNOSIS — M8580 Other specified disorders of bone density and structure, unspecified site: Secondary | ICD-10-CM | POA: Diagnosis not present

## 2016-04-28 DIAGNOSIS — Z7982 Long term (current) use of aspirin: Secondary | ICD-10-CM | POA: Diagnosis not present

## 2016-04-28 DIAGNOSIS — I2510 Atherosclerotic heart disease of native coronary artery without angina pectoris: Secondary | ICD-10-CM | POA: Diagnosis not present

## 2016-04-28 DIAGNOSIS — Z7983 Long term (current) use of bisphosphonates: Secondary | ICD-10-CM | POA: Diagnosis not present

## 2016-04-28 DIAGNOSIS — Z79811 Long term (current) use of aromatase inhibitors: Secondary | ICD-10-CM | POA: Diagnosis not present

## 2016-04-28 DIAGNOSIS — E7800 Pure hypercholesterolemia, unspecified: Secondary | ICD-10-CM | POA: Diagnosis not present

## 2016-04-28 DIAGNOSIS — Z853 Personal history of malignant neoplasm of breast: Secondary | ICD-10-CM | POA: Diagnosis not present

## 2016-04-28 DIAGNOSIS — I10 Essential (primary) hypertension: Secondary | ICD-10-CM | POA: Diagnosis not present

## 2016-04-28 DIAGNOSIS — I252 Old myocardial infarction: Secondary | ICD-10-CM | POA: Diagnosis not present

## 2016-04-28 DIAGNOSIS — T8131XA Disruption of external operation (surgical) wound, not elsewhere classified, initial encounter: Secondary | ICD-10-CM | POA: Diagnosis not present

## 2016-04-28 DIAGNOSIS — Y831 Surgical operation with implant of artificial internal device as the cause of abnormal reaction of the patient, or of later complication, without mention of misadventure at the time of the procedure: Secondary | ICD-10-CM | POA: Diagnosis not present

## 2016-04-28 DIAGNOSIS — Z79899 Other long term (current) drug therapy: Secondary | ICD-10-CM | POA: Diagnosis not present

## 2016-04-28 DIAGNOSIS — Z955 Presence of coronary angioplasty implant and graft: Secondary | ICD-10-CM | POA: Diagnosis not present

## 2016-04-28 DIAGNOSIS — Z85828 Personal history of other malignant neoplasm of skin: Secondary | ICD-10-CM | POA: Diagnosis not present

## 2016-04-28 HISTORY — DX: Noninfective gastroenteritis and colitis, unspecified: K52.9

## 2016-04-28 MED ORDER — CEFAZOLIN SODIUM-DEXTROSE 2-4 GM/100ML-% IV SOLN: 2.0000 g | Freq: Once | INTRAVENOUS | Status: DC

## 2016-04-28 NOTE — Pre-Procedure Instructions (Signed)
HAD STRESS/ECHO 10/17. NOTE FROM 04/28/16 VISIT WITH DR Nehemiah Massed ON CHART

## 2016-04-28 NOTE — Patient Instructions (Signed)
  Your procedure is scheduled on: tomorrow Report to Day Surgery. To find out your arrival time please call [phone] between 1PM - 3PM on today.  Remember: Instructions that are not followed completely may result in serious medical risk, up to and including death, or upon the discretion of your surgeon and anesthesiologist your surgery may need to be rescheduled.    __x__ 1. Do not eat food or drink liquids after midnight. No gum chewing or hard candies.     __x__ 2. No Alcohol for 24 hours before or after surgery.   ____ 3. Do Not Smoke For 24 Hours Prior to Your Surgery.   ____ 4. Bring all medications with you on the day of surgery if instructed.    __x__ 5. Notify your doctor if there is any change in your medical condition     (cold, fever, infections).       Do not wear jewelry, make-up, hairpins, clips or nail polish.  Do not wear lotions, powders, or perfumes. You may wear deodorant.  Do not shave 48 hours prior to surgery. Men may shave face and neck.  Do not bring valuables to the hospital.    [facility] is not responsible for any belongings or valuables.               Contacts, dentures or bridgework may not be worn into surgery.  Leave your suitcase in the car. After surgery it may be brought to your room.  For patients admitted to the hospital, discharge time is determined by your                treatment team.   Patients discharged the day of surgery will not be allowed to drive home.   Please read over the following fact sheets that you were given:      _x___ Take these medicines the morning of surgery with A SIP OF WATER:    1. anastrozole (ARIMIDEX) 1 MG tablet  2. carvedilol (COREG) 6.25 MG tablet  3. lisinopril (PRINIVIL,ZESTRIL) 10 MG tablet  4.  5.  6.  ____ Fleet Enema (as directed)   __x__ Use CHG Soap as directed  ____ Use inhalers on the day of surgery  ____ Stop metformin 2 days prior to surgery    ____ Take 1/2 of usual insulin dose  the night before surgery and none on the morning of surgery.   _x___ Stop aspirin on now  __x__ Stop Anti-inflammatories on tylenol only    ____ Stop supplements until after surgery.    ____ Bring C-Pap to the hospital.

## 2016-04-29 ENCOUNTER — Ambulatory Visit: Payer: Medicare PPO | Admitting: Registered Nurse

## 2016-04-29 ENCOUNTER — Encounter: Payer: Self-pay | Admitting: *Deleted

## 2016-04-29 ENCOUNTER — Encounter
Admission: RE | Disposition: A | Discharge status: Home / Self Care | Payer: Self-pay | Source: Ambulatory Visit | Attending: Orthopedic Surgery

## 2016-04-29 ENCOUNTER — Ambulatory Visit
Admission: RE | Admit: 2016-04-29 | Discharge: 2016-04-29 | Disposition: A | Discharge status: Home / Self Care | Payer: Medicare PPO | Source: Ambulatory Visit | Attending: Orthopedic Surgery | Admitting: Orthopedic Surgery

## 2016-04-29 DIAGNOSIS — Z79899 Other long term (current) drug therapy: Secondary | ICD-10-CM | POA: Insufficient documentation

## 2016-04-29 DIAGNOSIS — T8131XA Disruption of external operation (surgical) wound, not elsewhere classified, initial encounter: Secondary | ICD-10-CM | POA: Diagnosis not present

## 2016-04-29 DIAGNOSIS — E7800 Pure hypercholesterolemia, unspecified: Secondary | ICD-10-CM | POA: Insufficient documentation

## 2016-04-29 DIAGNOSIS — M8580 Other specified disorders of bone density and structure, unspecified site: Secondary | ICD-10-CM | POA: Insufficient documentation

## 2016-04-29 DIAGNOSIS — Z79811 Long term (current) use of aromatase inhibitors: Secondary | ICD-10-CM | POA: Insufficient documentation

## 2016-04-29 DIAGNOSIS — Z955 Presence of coronary angioplasty implant and graft: Secondary | ICD-10-CM | POA: Insufficient documentation

## 2016-04-29 DIAGNOSIS — I2510 Atherosclerotic heart disease of native coronary artery without angina pectoris: Secondary | ICD-10-CM | POA: Insufficient documentation

## 2016-04-29 DIAGNOSIS — I10 Essential (primary) hypertension: Secondary | ICD-10-CM | POA: Insufficient documentation

## 2016-04-29 DIAGNOSIS — Y831 Surgical operation with implant of artificial internal device as the cause of abnormal reaction of the patient, or of later complication, without mention of misadventure at the time of the procedure: Secondary | ICD-10-CM | POA: Insufficient documentation

## 2016-04-29 DIAGNOSIS — Z7983 Long term (current) use of bisphosphonates: Secondary | ICD-10-CM | POA: Insufficient documentation

## 2016-04-29 DIAGNOSIS — Z85828 Personal history of other malignant neoplasm of skin: Secondary | ICD-10-CM | POA: Insufficient documentation

## 2016-04-29 DIAGNOSIS — Z7982 Long term (current) use of aspirin: Secondary | ICD-10-CM | POA: Insufficient documentation

## 2016-04-29 DIAGNOSIS — Z853 Personal history of malignant neoplasm of breast: Secondary | ICD-10-CM | POA: Insufficient documentation

## 2016-04-29 DIAGNOSIS — I252 Old myocardial infarction: Secondary | ICD-10-CM | POA: Insufficient documentation

## 2016-04-29 HISTORY — PX: HARDWARE REMOVAL: SHX979

## 2016-04-29 SURGERY — REMOVAL, HARDWARE
Anesthesia: General | Site: Elbow | Laterality: Right | Wound class: Contaminated

## 2016-04-29 MED ORDER — FAMOTIDINE 20 MG PO TABS
20.0000 mg | ORAL_TABLET | Freq: Once | ORAL | Status: AC
  Administered 2016-04-29: 20 mg via ORAL

## 2016-04-29 MED ORDER — LACTATED RINGERS IV SOLN
INTRAVENOUS | Status: DC
  Administered 2016-04-29: 07:00:00 via INTRAVENOUS

## 2016-04-29 MED ORDER — NEOMYCIN-POLYMYXIN B GU 40-200000 IR SOLN
Status: DC | PRN
  Administered 2016-04-29: 2 mL

## 2016-04-29 MED ORDER — FENTANYL CITRATE (PF) 100 MCG/2ML IJ SOLN
INTRAMUSCULAR | Status: DC | PRN
  Administered 2016-04-29: 50 ug via INTRAVENOUS

## 2016-04-29 MED ORDER — ONDANSETRON HCL 4 MG/2ML IJ SOLN
INTRAMUSCULAR | Status: DC | PRN
  Administered 2016-04-29: 4 mg via INTRAVENOUS

## 2016-04-29 MED ORDER — MIDAZOLAM HCL 2 MG/2ML IJ SOLN
INTRAMUSCULAR | Status: DC | PRN
  Administered 2016-04-29: 1 mg via INTRAVENOUS

## 2016-04-29 MED ORDER — MIDAZOLAM HCL 2 MG/2ML IJ SOLN
INTRAMUSCULAR | Status: AC
  Filled 2016-04-29: qty 2

## 2016-04-29 MED ORDER — MIDAZOLAM HCL 2 MG/2ML IJ SOLN: 1.0000 mg | Freq: Once | INTRAMUSCULAR | Status: DC

## 2016-04-29 MED ORDER — HYDROCODONE-ACETAMINOPHEN 5-325 MG PO TABS: 1.0000 | ORAL_TABLET | Freq: Four times a day (QID) | ORAL | 0 refills | Status: DC | PRN

## 2016-04-29 MED ORDER — NEOMYCIN-POLYMYXIN B GU 40-200000 IR SOLN
Status: AC
  Filled 2016-04-29: qty 2

## 2016-04-29 MED ORDER — FENTANYL CITRATE (PF) 100 MCG/2ML IJ SOLN: 25.0000 ug | INTRAMUSCULAR | Status: DC | PRN

## 2016-04-29 MED ORDER — BUPIVACAINE HCL (PF) 0.5 % IJ SOLN
INTRAMUSCULAR | Status: AC
  Filled 2016-04-29: qty 30

## 2016-04-29 MED ORDER — PHENYLEPHRINE HCL 10 MG/ML IJ SOLN
INTRAMUSCULAR | Status: DC | PRN
  Administered 2016-04-29: 100 ug via INTRAVENOUS

## 2016-04-29 MED ORDER — FAMOTIDINE 20 MG PO TABS
ORAL_TABLET | ORAL | Status: AC
  Administered 2016-04-29: 20 mg via ORAL
  Filled 2016-04-29: qty 1

## 2016-04-29 MED ORDER — CEFAZOLIN SODIUM 1 G IJ SOLR
INTRAMUSCULAR | Status: DC | PRN
  Administered 2016-04-29: 2 g via INTRAMUSCULAR

## 2016-04-29 MED ORDER — EPHEDRINE SULFATE 50 MG/ML IJ SOLN
INTRAMUSCULAR | Status: DC | PRN
  Administered 2016-04-29 (×4): 10 mg via INTRAVENOUS

## 2016-04-29 MED ORDER — LIDOCAINE HCL (CARDIAC) 20 MG/ML IV SOLN
INTRAVENOUS | Status: DC | PRN
  Administered 2016-04-29: 60 mg via INTRAVENOUS

## 2016-04-29 MED ORDER — PROPOFOL 10 MG/ML IV BOLUS
INTRAVENOUS | Status: DC | PRN
  Administered 2016-04-29: 100 mg via INTRAVENOUS

## 2016-04-29 MED ORDER — ONDANSETRON HCL 4 MG/2ML IJ SOLN: 4.0000 mg | Freq: Once | INTRAMUSCULAR | Status: DC | PRN

## 2016-04-29 SURGICAL SUPPLY — 44 items
BNDG COHESIVE 4X5 TAN STRL (GAUZE/BANDAGES/DRESSINGS) ×2 IMPLANT
CANISTER SUCT 1200ML W/VALVE (MISCELLANEOUS) ×2 IMPLANT
CHLORAPREP W/TINT 26ML (MISCELLANEOUS) ×2 IMPLANT
CUFF TOURN 24 STER (MISCELLANEOUS) IMPLANT
DRAPE C-ARM XRAY 36X54 (DRAPES) IMPLANT
DRAPE INCISE IOBAN 66X45 STRL (DRAPES) IMPLANT
DRAPE U-SHAPE 47X51 STRL (DRAPES) ×2 IMPLANT
DRSG EMULSION OIL 3X8 NADH (GAUZE/BANDAGES/DRESSINGS) IMPLANT
ELECT CAUTERY BLADE 6.4 (BLADE) IMPLANT
ELECT REM PT RETURN 9FT ADLT (ELECTROSURGICAL) ×2
ELECTRODE REM PT RTRN 9FT ADLT (ELECTROSURGICAL) ×1 IMPLANT
GAUZE PETRO XEROFOAM 1X8 (MISCELLANEOUS) ×2 IMPLANT
GAUZE SPONGE 4X4 12PLY STRL (GAUZE/BANDAGES/DRESSINGS) ×2 IMPLANT
GLOVE BIOGEL PI IND STRL 9 (GLOVE) ×1 IMPLANT
GLOVE BIOGEL PI INDICATOR 9 (GLOVE) ×1
GLOVE SURG SYN 9.0  PF PI (GLOVE) ×1
GLOVE SURG SYN 9.0 PF PI (GLOVE) ×1 IMPLANT
GOWN SRG 2XL LVL 4 RGLN SLV (GOWNS) ×1 IMPLANT
GOWN STRL NON-REIN 2XL LVL4 (GOWNS) ×1
GOWN STRL REUS W/ TWL LRG LVL3 (GOWN DISPOSABLE) ×1 IMPLANT
GOWN STRL REUS W/TWL LRG LVL3 (GOWN DISPOSABLE) ×1
GOWN STRL REUS W/TWL XL LVL4 (GOWN DISPOSABLE) ×2 IMPLANT
KIT RM TURNOVER STRD PROC AR (KITS) ×2 IMPLANT
NEEDLE FILTER BLUNT 18X 1/2SAF (NEEDLE) ×1
NEEDLE FILTER BLUNT 18X1 1/2 (NEEDLE) ×1 IMPLANT
NS IRRIG 1000ML POUR BTL (IV SOLUTION) ×2 IMPLANT
PACK EXTREMITY ARMC (MISCELLANEOUS) ×2 IMPLANT
PACK HIP COMPR (MISCELLANEOUS) IMPLANT
PAD ABD DERMACEA PRESS 5X9 (GAUZE/BANDAGES/DRESSINGS) IMPLANT
PAD CAST CTTN 4X4 STRL (SOFTGOODS) ×1 IMPLANT
PADDING CAST COTTON 4X4 STRL (SOFTGOODS) ×1
PREP PVP WINGED SPONGE (MISCELLANEOUS) IMPLANT
STAPLER SKIN PROX 35W (STAPLE) IMPLANT
STOCKINETTE BIAS CUT 3 980034 (MISCELLANEOUS) ×2 IMPLANT
STOCKINETTE M/LG 89821 (MISCELLANEOUS) ×2 IMPLANT
SUT ETHIBOND NAB CT1 #1 30IN (SUTURE) IMPLANT
SUT ETHILON 3-0 FS-10 30 BLK (SUTURE)
SUT VIC AB 0 CT1 36 (SUTURE) IMPLANT
SUT VIC AB 1 CTX 27 (SUTURE) IMPLANT
SUT VIC AB 2-0 CT1 27 (SUTURE)
SUT VIC AB 2-0 CT1 TAPERPNT 27 (SUTURE) IMPLANT
SUTURE EHLN 3-0 FS-10 30 BLK (SUTURE) IMPLANT
SYRINGE 10CC LL (SYRINGE) ×2 IMPLANT
WATER STERILE IRR 1000ML POUR (IV SOLUTION) IMPLANT

## 2016-04-29 NOTE — Transfer of Care (Signed)
Immediate Anesthesia Transfer of Care Note  Patient: Sherri [name]  Procedure(s) Performed: Procedure(s): HARDWARE REMOVAL (Right)  Patient Location: PACU  Anesthesia Type:General  Level of Consciousness: sedated  Airway & Oxygen Therapy: Patient Spontanous Breathing and Patient connected to face mask oxygen  Post-op Assessment: Report given to RN and Post -op Vital signs reviewed and stable  Post vital signs: Reviewed and stable  Last Vitals:  Vitals:   04/29/16 0612 04/29/16 0813  BP: (!) 136/57 (!) 105/47  Pulse: 62 (!) 46  Resp: 16 15  Temp: 37.1 C [id number] C    Complications: No apparent anesthesia complications

## 2016-04-29 NOTE — H&P (Signed)
Subjective:   Patient is a 82 y.o. female presents with right elbow exposed hardware. Onset of symptoms was gradual starting 3 weeks ago with gradually worsening course since that time. The pain is located over elbow. Patient describes the pain as minimal continuous and rated as mild. Prior ORIF.  There are no active problems to display for this patient.  Past Medical History:  Diagnosis Date  . Anxiety   . Breast cancer ([place])   . Bursitis of elbow    RIGHT  . Cancer [facility]) 2013   right breast lumpectomy with a few nodes removed  . Colitis   . Coronary artery disease   . Diverticulitis   . Dysrhythmia    according to anxiety, tachy  . GERD (gastroesophageal reflux disease)    okay since took her gallbladder out  . H/O lumpectomy   . Heart disease   . History of coronary artery stent placement 2007   one stent  . HOH (hard of hearing)   . Hypercholesteremia   . Hypertension   . Myocardial infarction    2007  . Osteopenia   . Palpitations   . Skin cancer     Past Surgical History:  Procedure Laterality Date  . BREAST LUMPECTOMY Right 2013   lymph node dissection  . CATARACT EXTRACTION W/PHACO Right 07/23/2015   Procedure: CATARACT EXTRACTION PHACO AND INTRAOCULAR LENS PLACEMENT (IOC);  Surgeon: Birder Robson, MD;  Location: [facility] ORS;  Service: Ophthalmology;  Laterality: Right;  [place] 01:24 AP% 26.7 CDE 22.47 fluid pack lot # [id number]  . CATARACT EXTRACTION W/PHACO Left 08/13/2015   Procedure: CATARACT EXTRACTION PHACO AND INTRAOCULAR LENS PLACEMENT (IOC);  Surgeon: Birder Robson, MD;  Location: [facility] ORS;  Service: Ophthalmology;  Laterality: Left;  [place] 02:40 AP% 30.7 CDE 49.28 fluid pack lot # [id number]  . CHOLECYSTECTOMY    . COLONOSCOPY    . CORONARY ANGIOPLASTY  2007  . EYE SURGERY     bilateral cataract  . ORIF ELBOW FRACTURE Right 10/01/2015   Procedure: OPEN REDUCTION INTERNAL FIXATION (ORIF) ELBOW/OLECRANON FRACTURE;  Surgeon: Hessie Knows, MD;  Location: [facility]  ORS;  Service: Orthopedics;  Laterality: Right;    Prescriptions Prior to Admission  Medication Sig Dispense Refill Last Dose  . anastrozole (ARIMIDEX) 1 MG tablet Take 1 tablet (1 mg total) by mouth daily. 90 tablet 1 04/29/2016 at Unknown time  . carvedilol (COREG) 6.25 MG tablet Take 6.25 mg by mouth 2 (two) times daily.   04/29/2016 at 0430  . Coenzyme Q10 (CO Q 10 PO) Take 100 mg by mouth daily.   04/28/2016 at Unknown time  . lisinopril (PRINIVIL,ZESTRIL) 10 MG tablet Take 5 mg by mouth 2 (two) times daily.   04/29/2016 at Unknown time  . simvastatin (ZOCOR) 40 MG tablet Take 40 mg by mouth at bedtime.   04/28/2016 at Unknown time  . alendronate (FOSAMAX) 70 MG tablet Take 70 mg by mouth once a week. Take with a full glass of water on an empty stomach.   04/15/2016  . aspirin 81 MG tablet Take 81 mg by mouth at bedtime.   04/27/2016  . CALCIUM CITRATE PO Take by mouth.   04/15/2016  . Cholecalciferol (VITAMIN D-3 PO) Take 2,000 mg by mouth at bedtime.    04/15/2016   Allergies  Allergen Reactions  . Prednisone Other (See Comments)    Mood alterations    Social History  Substance Use Topics  . Smoking status: Never Smoker  . Smokeless tobacco: Never  Used  . Alcohol use Yes     Comment: wine occasionally    Family History  Problem Relation Age of Onset  . Breast cancer Sister 31    Review of Systems Pertinent items are noted in HPI.  Objective:   Patient Vitals for the past 8 hrs:  BP Temp Temp src Pulse Resp SpO2 Height Weight  04/29/16 0612 (!) 136/57 98.8 F (37.1 C) Tympanic 62 16 97 % 5\' 2"  (1.575 m) 59 kg (130 lb)   No intake/output data recorded. No intake/output data recorded.    BP (!) 136/57   Pulse 62   Temp 98.8 F (37.1 C) (Tympanic)   Resp 16   Ht 5\' 2"  (1.575 m)   Wt 59 kg (130 lb)   SpO2 97%   BMI 23.78 kg/m  General appearance: alert, cooperative and appears stated age Lungs: clear to auscultation bilaterally Heart: regular rate and  rhythm, S1, S2 normal, no murmur, click, rub or gallop Extremities: 1.5 cm open area with exposed metal  Xray:  Healed fracture  Assessment:   Active Problems:   * No active hospital problems. * exposed hardware   Plan:   Remove plate and screws, close wound as possible, open area will close with secondary intention.

## 2016-04-29 NOTE — Anesthesia Procedure Notes (Signed)
Procedure Name: LMA Insertion Date/Time: 04/29/2016 7:52 AM Performed by: Doreen Salvage Pre-anesthesia Checklist: Patient identified, Patient being monitored, Timeout performed, Emergency Drugs available and Suction available Patient Re-evaluated:Patient Re-evaluated prior to inductionOxygen Delivery Method: Circle system utilized Preoxygenation: Pre-oxygenation with 100% oxygen Intubation Type: IV induction Ventilation: Mask ventilation without difficulty LMA: LMA inserted LMA Size: 3.5 Tube type: Oral Number of attempts: 2 Placement Confirmation: positive ETCO2 and breath sounds checked- equal and bilateral Tube secured with: Tape Dental Injury: Teeth and Oropharynx as per pre-operative assessment

## 2016-04-29 NOTE — Anesthesia Preprocedure Evaluation (Signed)
Anesthesia Evaluation  Patient identified by MRN, date of birth, ID band Patient awake    Reviewed: Allergy & Precautions, H&P , NPO status , Patient's Chart, lab work & pertinent test results, reviewed documented beta blocker date and time   Airway Mallampati: II  TM Distance: >3 FB Neck ROM: full    Dental  (+) Teeth Intact, Poor Dentition   Pulmonary neg pulmonary ROS,    Pulmonary exam normal        Cardiovascular Exercise Tolerance: Good hypertension, + CAD and + Past MI  negative cardio ROS Normal cardiovascular exam+ dysrhythmias  Rate:Normal     Neuro/Psych negative neurological ROS  negative psych ROS   GI/Hepatic negative GI ROS, Neg liver ROS, GERD  Medicated,  Endo/Other  negative endocrine ROS  Renal/GU negative Renal ROS  negative genitourinary   Musculoskeletal   Abdominal   Peds  Hematology negative hematology ROS (+)   Anesthesia Other Findings   Reproductive/Obstetrics negative OB ROS                             Anesthesia Physical Anesthesia Plan  ASA: III  Anesthesia Plan: General LMA   Post-op Pain Management:    Induction:   Airway Management Planned:   Additional Equipment:   Intra-op Plan:   Post-operative Plan:   Informed Consent: I have reviewed the patients History and Physical, chart, labs and discussed the procedure including the risks, benefits and alternatives for the proposed anesthesia with the patient or authorized representative who has indicated his/her understanding and acceptance.     Plan Discussed with: CRNA  Anesthesia Plan Comments:         Anesthesia Quick Evaluation

## 2016-04-29 NOTE — Discharge Instructions (Addendum)
Try to minimize use of arm until recheck, leave bandage in place keep clean and dry.  AMBULATORY SURGERY  DISCHARGE INSTRUCTIONS   1) The drugs that you were given will stay in your system until tomorrow so for the next 24 hours you should not:  A) Drive an automobile B) Make any legal decisions C) Drink any alcoholic beverage   2) You may resume regular meals tomorrow.  Today it is better to start with liquids and gradually work up to solid foods.  You may eat anything you prefer, but it is better to start with liquids, then soup and crackers, and gradually work up to solid foods.   3) Please notify your doctor immediately if you have any unusual bleeding, trouble breathing, redness and pain at the surgery site, drainage, fever, or pain not relieved by medication.    4) Additional Instructions:        Please contact your physician with any problems or Same Day Surgery at [phone], Monday through Friday 6 am to 4 pm, or [place] [facility] number at [phone].

## 2016-04-29 NOTE — Op Note (Signed)
04/29/2016  8:11 AM  PATIENT:  Sherri [name]  82 y.o. female  PRE-OPERATIVE DIAGNOSIS:  HISTORY OF ORIF ELBOW PROCEDURE  POST-OPERATIVE DIAGNOSIS:  exposed hardware, dehiscence right elbow  PROCEDURE:  Procedure(s): HARDWARE REMOVAL (Right)  SURGEON: Laurene Footman, MD  ASSISTANTS: none  ANESTHESIA:   general  EBL:  No intake/output data recorded.  BLOOD ADMINISTERED:none  DRAINS: none   LOCAL MEDICATIONS USED:  NONE  SPECIMEN:  No Specimen  DISPOSITION OF SPECIMEN:  N/A  COUNTS:  YES  TOURNIQUET:  * No tourniquets in log *  IMPLANTS: none  DICTATION: .Dragon Dictation patient was brought to the operating room and after adequate anesthesia was obtained, the right arm was prepped and draped in usual sterile fashion. After patient identification and timeout procedures were completed, the prior incision was opened distally with the proximal most portion of the incision reopened with exposed hardware. After exposure of the plate, all screws removed without difficulty. There were 6 screws and one washer all hardware removed along with the plate and sutures that had been placed as a tension band. The fracture appeared healed with no motion with range of motion of the elbow. There was no gross purulent material but no culture obtained. A curet was used to scrape the tissue adjacent to the plate to get fresh bleeding tissue for granulation and a get healing of the proximal end of the incision which was left open. The wound was closed with staples and the wound dressed with Xeroform 4 x 4's and a bias wrap along the web roll  PLAN OF CARE: Discharge to home after PACU  PATIENT DISPOSITION:  PACU - hemodynamically stable.

## 2016-04-30 NOTE — Anesthesia Postprocedure Evaluation (Signed)
Anesthesia Post Note  Patient: Sherri [name]  Procedure(s) Performed: Procedure(s) (LRB): HARDWARE REMOVAL (Right)  Patient location during evaluation: PACU Anesthesia Type: General Level of consciousness: awake and alert Pain management: pain level controlled Vital Signs Assessment: post-procedure vital signs reviewed and stable Respiratory status: spontaneous breathing, nonlabored ventilation, respiratory function stable and patient connected to nasal cannula oxygen Cardiovascular status: blood pressure returned to baseline and stable Postop Assessment: no signs of nausea or vomiting Anesthetic complications: no    Last Vitals:  Vitals:   04/29/16 0911 04/29/16 0956  BP: (!) 105/57 (!) 106/57  Pulse: (!) 50 (!) 55  Resp: 16 16  Temp: 36.4 C 36.9 C    Last Pain:  Vitals:   04/29/16 0911  TempSrc: Oral  PainSc:                  Molli Barrows

## 2016-06-08 ENCOUNTER — Inpatient Hospital Stay: Payer: Medicare PPO | Attending: Oncology | Admitting: Oncology

## 2016-06-08 DIAGNOSIS — M8580 Other specified disorders of bone density and structure, unspecified site: Secondary | ICD-10-CM | POA: Insufficient documentation

## 2016-06-08 DIAGNOSIS — C50211 Malignant neoplasm of upper-inner quadrant of right female breast: Secondary | ICD-10-CM | POA: Diagnosis present

## 2016-06-08 DIAGNOSIS — I252 Old myocardial infarction: Secondary | ICD-10-CM | POA: Diagnosis not present

## 2016-06-08 DIAGNOSIS — Z85828 Personal history of other malignant neoplasm of skin: Secondary | ICD-10-CM | POA: Diagnosis not present

## 2016-06-08 DIAGNOSIS — Z79899 Other long term (current) drug therapy: Secondary | ICD-10-CM | POA: Insufficient documentation

## 2016-06-08 DIAGNOSIS — Z170 Estrogen receptor positive status [ER+]: Secondary | ICD-10-CM | POA: Insufficient documentation

## 2016-06-08 DIAGNOSIS — F419 Anxiety disorder, unspecified: Secondary | ICD-10-CM | POA: Insufficient documentation

## 2016-06-08 DIAGNOSIS — Z955 Presence of coronary angioplasty implant and graft: Secondary | ICD-10-CM | POA: Insufficient documentation

## 2016-06-08 DIAGNOSIS — I10 Essential (primary) hypertension: Secondary | ICD-10-CM | POA: Diagnosis not present

## 2016-06-08 DIAGNOSIS — Z7982 Long term (current) use of aspirin: Secondary | ICD-10-CM | POA: Insufficient documentation

## 2016-06-08 DIAGNOSIS — I2510 Atherosclerotic heart disease of native coronary artery without angina pectoris: Secondary | ICD-10-CM | POA: Insufficient documentation

## 2016-06-08 DIAGNOSIS — E7800 Pure hypercholesterolemia, unspecified: Secondary | ICD-10-CM | POA: Insufficient documentation

## 2016-06-08 DIAGNOSIS — K219 Gastro-esophageal reflux disease without esophagitis: Secondary | ICD-10-CM | POA: Insufficient documentation

## 2016-06-08 DIAGNOSIS — Z79811 Long term (current) use of aromatase inhibitors: Secondary | ICD-10-CM

## 2016-06-08 DIAGNOSIS — Z9221 Personal history of antineoplastic chemotherapy: Secondary | ICD-10-CM

## 2016-06-08 NOTE — Progress Notes (Signed)
Offers no complaints. States is feeling well. Has short term follow up mammogram scheduled at end of the month. States at last mammogram abnormality was found.

## 2016-06-08 NOTE — Progress Notes (Signed)
[name]  [phone]) [phone] Fax:(336) [phone]  ID: Sherri [name] OB: Jun 19, 1932  MR#: [id number]  [id number]  Patient Care Team: [name], MD as PCP - General (Family Medicine)  CHIEF COMPLAINT: Stage IIb ER/PR positive, upper inner quadrant right breast.  INTERVAL HISTORY: Patient returns to clinic today for routine 6 month evaluation. She currently feels well and is asymptomatic. She is tolerating Arimidex well without significant side effects. She has no neurological complaints. She denies any recent fevers or illnesses. She denies any chest pain or shortness of breath. She has a good appetite and denies weight loss. She has no nausea, vomiting, constipation, or diarrhea. She has no urinary complaints. Patient offers no further specific complaints today.   REVIEW OF SYSTEMS:   Review of Systems  Constitutional: Negative for diaphoresis, fever, malaise/fatigue and weight loss.  Respiratory: Negative.  Negative for cough and shortness of breath.   Cardiovascular: Negative.  Negative for chest pain.  Gastrointestinal: Negative.  Negative for abdominal pain.  Genitourinary: Negative.   Musculoskeletal: Positive for joint pain.  Neurological: Negative.  Negative for sensory change and weakness.  Psychiatric/Behavioral: Negative.     As per HPI. Otherwise, a complete review of systems is negative.  PAST MEDICAL HISTORY: Past Medical History:  Diagnosis Date  . Anxiety   . Breast cancer ([place])   . Bursitis of elbow    RIGHT  . Cancer [name]) 2013   right breast lumpectomy with a few nodes removed  . Colitis   . Coronary artery disease   . Diverticulitis   . Dysrhythmia    according to anxiety, tachy  . GERD (gastroesophageal reflux disease)    okay since took her gallbladder out  . H/O lumpectomy   . Heart disease   . History of coronary artery stent placement 2007   one stent  . HOH (hard of hearing)   . Hypercholesteremia   .  Hypertension   . Myocardial infarction    2007  . Osteopenia   . Palpitations   . Skin cancer     PAST SURGICAL HISTORY: Past Surgical History:  Procedure Laterality Date  . BREAST LUMPECTOMY Right 2013   lymph node dissection  . CATARACT EXTRACTION W/PHACO Right 07/23/2015   Procedure: CATARACT EXTRACTION PHACO AND INTRAOCULAR LENS PLACEMENT (IOC);  Surgeon: Birder Robson, MD;  Location: [facility] ORS;  Service: Ophthalmology;  Laterality: Right;  [place] 01:24 AP% 26.7 CDE 22.47 fluid pack lot # [id number]  . CATARACT EXTRACTION W/PHACO Left 08/13/2015   Procedure: CATARACT EXTRACTION PHACO AND INTRAOCULAR LENS PLACEMENT (IOC);  Surgeon: Birder Robson, MD;  Location: [facility] ORS;  Service: Ophthalmology;  Laterality: Left;  [place] 02:40 AP% 30.7 CDE 49.28 fluid pack lot # [id number]  . CHOLECYSTECTOMY    . COLONOSCOPY    . CORONARY ANGIOPLASTY  2007  . EYE SURGERY     bilateral cataract  . HARDWARE REMOVAL Right 04/29/2016   Procedure: HARDWARE REMOVAL;  Surgeon: Hessie Knows, MD;  Location: [facility] ORS;  Service: Orthopedics;  Laterality: Right;  . ORIF ELBOW FRACTURE Right 10/01/2015   Procedure: OPEN REDUCTION INTERNAL FIXATION (ORIF) ELBOW/OLECRANON FRACTURE;  Surgeon: Hessie Knows, MD;  Location: [facility] ORS;  Service: Orthopedics;  Laterality: Right;    FAMILY HISTORY: Reviewed and unchanged. No reported history of malignancy or chronic disease.     ADVANCED DIRECTIVES:    HEALTH MAINTENANCE: Social History  Substance Use Topics  . Smoking status: Never Smoker  . Smokeless tobacco: Never Used  . Alcohol  use Yes     Comment: wine occasionally     Colonoscopy:  PAP:  Bone density:  Lipid panel:  Allergies  Allergen Reactions  . Prednisone Other (See Comments)    Mood alterations    Current Outpatient Prescriptions  Medication Sig Dispense Refill  . alendronate (FOSAMAX) 70 MG tablet Take 70 mg by mouth once a week. Take with a full glass of water on an empty stomach.    Marland Kitchen  anastrozole (ARIMIDEX) 1 MG tablet Take 1 tablet (1 mg total) by mouth daily. 90 tablet 1  . aspirin 81 MG tablet Take 81 mg by mouth at bedtime.    Marland Kitchen CALCIUM CITRATE PO Take by mouth.    . carvedilol (COREG) 6.25 MG tablet Take 6.25 mg by mouth 2 (two) times daily.    . Cholecalciferol (VITAMIN D-3 PO) Take 2,000 mg by mouth at bedtime.     . Coenzyme Q10 (CO Q 10 PO) Take 100 mg by mouth daily.    Marland Kitchen HYDROcodone-acetaminophen (NORCO) 5-325 MG tablet Take 1 tablet by mouth every 6 (six) hours as needed for moderate pain. 30 tablet 0  . lisinopril (PRINIVIL,ZESTRIL) 10 MG tablet Take 5 mg by mouth 2 (two) times daily.    . simvastatin (ZOCOR) 40 MG tablet Take 40 mg by mouth at bedtime.     No current facility-administered medications for this visit.     OBJECTIVE: Vitals:   06/08/16 1002  BP: (!) 171/78  Pulse: (!) 59  Resp: 18  Temp: 97.7 F (36.5 C)     Body mass index is 24.76 kg/m.    ECOG FS:0 - Asymptomatic  General: Well-developed, well-nourished, no acute distress. Eyes: Pink conjunctiva, anicteric sclera. Breasts: Bilateral breasts and axilla without lumps or masses. Lungs: Clear to auscultation bilaterally. Heart: Regular rate and rhythm. No rubs, murmurs, or gallops. Abdomen: Soft, nontender, nondistended. No organomegaly noted, normoactive bowel sounds. Musculoskeletal: No edema, cyanosis, or clubbing. Neuro: Alert, answering all questions appropriately. Cranial nerves grossly intact. Skin: No rashes or petechiae noted. Psych: Normal affect.   LAB RESULTS:  No results found for: NA, K, CL, CO2, GLUCOSE, BUN, CREATININE, CALCIUM, PROT, ALBUMIN, AST, ALT, ALKPHOS, BILITOT, GFRNONAA, GFRAA  No results found for: WBC, NEUTROABS, HGB, HCT, MCV, PLT   STUDIES: No results found.  ASSESSMENT: Stage IIb ER/PR positive, upper inner quadrant right breast.  PLAN:    1. Stage IIb ER/PR positive, upper inner quadrant right breast: Although we do not have access to  patient's pathology report, her stage and location of breast were determined based on clinic notes. AJCC staging is also unavailable. Patient received 6 months of Arimidex between April 2013 and October 2013 prior to undergoing right breast lumpectomy. She reports she underwent adjuvant XRT, but did not receive adjuvant chemotherapy. Patient will complete 5 years of Arimidex in October 2018.  Her most recent mammogram on January 15, 2016 was reported as BI-RADS 3, recommendation was to repeat right breast mammogram only in February 2018. Return to clinic in 6 months for further evaluation. 2. Osteopenia: Patient's bone density on January 15, 2016 reported T score of -2.2. Patient cannot take calcium supplementation secondary to underlying colitis. Continue Fosamax and vitamin D.  Repeat in August 2018.    Patient expressed understanding and was in agreement with this plan. She also understands that She can call clinic at any time with any questions, concerns, or complaints.    Lloyd Huger, MD   06/08/2016 10:06 AM

## 2016-07-08 ENCOUNTER — Other Ambulatory Visit: Payer: Self-pay | Admitting: *Deleted

## 2016-07-08 MED ORDER — ANASTROZOLE 1 MG PO TABS: 1.0000 mg | ORAL_TABLET | Freq: Every day | ORAL | 1 refills | Status: DC

## 2016-07-20 ENCOUNTER — Ambulatory Visit: Payer: Medicare PPO

## 2016-07-20 ENCOUNTER — Other Ambulatory Visit: Payer: Medicare PPO

## 2016-07-31 ENCOUNTER — Ambulatory Visit
Admission: RE | Admit: 2016-07-31 | Discharge: 2016-07-31 | Disposition: A | Discharge status: Home / Self Care | Payer: Medicare PPO | Source: Ambulatory Visit | Attending: Oncology | Admitting: Oncology

## 2016-07-31 DIAGNOSIS — Z08 Encounter for follow-up examination after completed treatment for malignant neoplasm: Secondary | ICD-10-CM | POA: Diagnosis not present

## 2016-07-31 DIAGNOSIS — Z853 Personal history of malignant neoplasm of breast: Secondary | ICD-10-CM

## 2016-07-31 DIAGNOSIS — Z9889 Other specified postprocedural states: Secondary | ICD-10-CM | POA: Insufficient documentation

## 2016-12-04 NOTE — Progress Notes (Signed)
[place]  Telephone:(336) [phone] Fax:(336) [phone]  ID: [name] OB: [birth year]  MR#: [id number]  OQH#:[id number]  Patient Care Team: [name], MD as PCP - General (Family Medicine)  CHIEF COMPLAINT: Stage IIb ER/PR positive, upper inner quadrant right breast.  INTERVAL HISTORY: Patient returns to clinic today for routine 6 month evaluation. She continues to feel well and is asymptomatic. She is tolerating Arimidex well without significant side effects. She has no neurological complaints. She denies any recent fevers or illnesses. She denies any chest pain or shortness of breath. She has a good appetite and denies weight loss. She has no nausea, vomiting, constipation, or diarrhea. She has no urinary complaints. Patient offers no specific complaints today.   REVIEW OF SYSTEMS:   Review of Systems  Constitutional: Negative for diaphoresis, fever, malaise/fatigue and weight loss.  Respiratory: Negative.  Negative for cough and shortness of breath.   Cardiovascular: Negative.  Negative for chest pain.  Gastrointestinal: Negative.  Negative for abdominal pain.  Genitourinary: Negative.   Musculoskeletal: Positive for joint pain.  Neurological: Negative.  Negative for sensory change and weakness.  Psychiatric/Behavioral: Negative.     As per HPI. Otherwise, a complete review of systems is negative.  PAST MEDICAL HISTORY: Past Medical History:  Diagnosis Date  . Anxiety   . Breast cancer ([place]) 2013   RIGHT lumpectomy  . Bursitis of elbow    RIGHT  . Cancer [facility]) 2013   right breast lumpectomy with a few nodes removed  . Colitis   . Coronary artery disease   . Diverticulitis   . Dysrhythmia    according to anxiety, tachy  . GERD (gastroesophageal reflux disease)    okay since took her gallbladder out  . H/O lumpectomy   . Heart disease   . History of coronary artery stent placement 2007   one stent  . HOH (hard of hearing)   .  Hypercholesteremia   . Hypertension   . Myocardial infarction    2007  . Osteopenia   . Palpitations   . Skin cancer     PAST SURGICAL HISTORY: Past Surgical History:  Procedure Laterality Date  . BREAST LUMPECTOMY Right 2013   lymph node dissection  . CATARACT EXTRACTION W/PHACO Right 07/23/2015   Procedure: CATARACT EXTRACTION PHACO AND INTRAOCULAR LENS PLACEMENT (IOC);  Surgeon: Birder Robson, MD;  Location: [facility] ORS;  Service: Ophthalmology;  Laterality: Right;  [place] 01:24 AP% 26.7 CDE 22.47 fluid pack lot # [id number]  . CATARACT EXTRACTION W/PHACO Left 08/13/2015   Procedure: CATARACT EXTRACTION PHACO AND INTRAOCULAR LENS PLACEMENT (IOC);  Surgeon: Birder Robson, MD;  Location: [facility] ORS;  Service: Ophthalmology;  Laterality: Left;  [place] 02:40 AP% 30.7 CDE 49.28 fluid pack lot # [id number]  . CHOLECYSTECTOMY    . COLONOSCOPY    . CORONARY ANGIOPLASTY  2007  . EYE SURGERY     bilateral cataract  . HARDWARE REMOVAL Right 04/29/2016   Procedure: HARDWARE REMOVAL;  Surgeon: Hessie Knows, MD;  Location: [facility] ORS;  Service: Orthopedics;  Laterality: Right;  . ORIF ELBOW FRACTURE Right 10/01/2015   Procedure: OPEN REDUCTION INTERNAL FIXATION (ORIF) ELBOW/OLECRANON FRACTURE;  Surgeon: Hessie Knows, MD;  Location: [facility] ORS;  Service: Orthopedics;  Laterality: Right;    FAMILY HISTORY: Reviewed and unchanged. No reported history of malignancy or chronic disease.     ADVANCED DIRECTIVES:    HEALTH MAINTENANCE: Social History  Substance Use Topics  . Smoking status: Never Smoker  . Smokeless tobacco: Never  Used  . Alcohol use Yes     Comment: wine occasionally     Colonoscopy:  PAP:  Bone density:  Lipid panel:  Allergies  Allergen Reactions  . Prednisone Other (See Comments)    Mood alterations    Current Outpatient Prescriptions  Medication Sig Dispense Refill  . alendronate (FOSAMAX) 70 MG tablet Take 70 mg by mouth once a week. Take with a full glass of water on  an empty stomach.    Marland Kitchen anastrozole (ARIMIDEX) 1 MG tablet Take 1 tablet (1 mg total) by mouth daily. 90 tablet 1  . aspirin 81 MG tablet Take 81 mg by mouth at bedtime.    Marland Kitchen CALCIUM CITRATE PO Take by mouth.    . carvedilol (COREG) 6.25 MG tablet Take 6.25 mg by mouth 2 (two) times daily.    . Cholecalciferol (VITAMIN D-3 PO) Take 2,000 mg by mouth at bedtime.     . Coenzyme Q10 (CO Q 10 PO) Take 100 mg by mouth daily.    Marland Kitchen HYDROcodone-acetaminophen (NORCO) 5-325 MG tablet Take 1 tablet by mouth every 6 (six) hours as needed for moderate pain. 30 tablet 0  . lisinopril (PRINIVIL,ZESTRIL) 10 MG tablet Take 5 mg by mouth 2 (two) times daily.    . simvastatin (ZOCOR) 40 MG tablet Take 40 mg by mouth at bedtime.     No current facility-administered medications for this visit.     OBJECTIVE: Vitals:   12/07/16 1031  BP: (!) 161/93  Pulse: 62  Resp: 20  Temp: 97.7 F (36.5 C)     Body mass index is 26.12 kg/m.    ECOG FS:0 - Asymptomatic  General: Well-developed, well-nourished, no acute distress. Eyes: Pink conjunctiva, anicteric sclera. Breasts: Bilateral breasts and axilla without lumps or masses. Lungs: Clear to auscultation bilaterally. Heart: Regular rate and rhythm. No rubs, murmurs, or gallops. Abdomen: Soft, nontender, nondistended. No organomegaly noted, normoactive bowel sounds. Musculoskeletal: No edema, cyanosis, or clubbing. Neuro: Alert, answering all questions appropriately. Cranial nerves grossly intact. Skin: No rashes or petechiae noted. Psych: Normal affect.   LAB RESULTS:  No results found for: NA, K, CL, CO2, GLUCOSE, BUN, CREATININE, CALCIUM, PROT, ALBUMIN, AST, ALT, ALKPHOS, BILITOT, GFRNONAA, GFRAA  No results found for: WBC, NEUTROABS, HGB, HCT, MCV, PLT   STUDIES: No results found.  ASSESSMENT: Stage IIb ER/PR positive, upper inner quadrant right breast.  PLAN:    1. Stage IIb ER/PR positive, upper inner quadrant right breast: Although we do not  have access to patient's pathology report, her stage and location of breast were determined based on clinic notes. AJCC staging is also unavailable. Patient received 6 months of Arimidex between April 2013 and October 2013 prior to undergoing right breast lumpectomy. She reports she underwent adjuvant XRT, but did not receive adjuvant chemotherapy. Patient will complete 5 years of Arimidex in October 2018.  Her most recent mammogram on August 01, 2016 was reported as BI-RADS 3, repeat bilateral mammogram in August 2018. Return to clinic in 6 months for further evaluation. 2. Osteopenia: Patient's bone density on January 15, 2016 reported T score of -2.2. Patient cannot take calcium supplementation secondary to underlying colitis. Continue Fosamax and vitamin D.  Repeat in August 2018. 3. Hypertension: Patient's blood pressure is mildly elevated today. Continue monitoring and treatment per primary care.   Patient expressed understanding and was in agreement with this plan. She also understands that She can call clinic at any time with any questions, concerns, or complaints.  Lloyd Huger, MD   12/07/2016 10:53 AM

## 2016-12-07 ENCOUNTER — Inpatient Hospital Stay: Payer: Medicare PPO | Attending: Oncology | Admitting: Oncology

## 2016-12-07 VITALS — BP 161/93 | HR 62 | Temp 97.7°F | Resp 20 | Wt 142.8 lb

## 2016-12-07 DIAGNOSIS — I2510 Atherosclerotic heart disease of native coronary artery without angina pectoris: Secondary | ICD-10-CM | POA: Insufficient documentation

## 2016-12-07 DIAGNOSIS — M8580 Other specified disorders of bone density and structure, unspecified site: Secondary | ICD-10-CM

## 2016-12-07 DIAGNOSIS — Z170 Estrogen receptor positive status [ER+]: Secondary | ICD-10-CM | POA: Diagnosis not present

## 2016-12-07 DIAGNOSIS — Z85828 Personal history of other malignant neoplasm of skin: Secondary | ICD-10-CM | POA: Insufficient documentation

## 2016-12-07 DIAGNOSIS — K219 Gastro-esophageal reflux disease without esophagitis: Secondary | ICD-10-CM | POA: Diagnosis not present

## 2016-12-07 DIAGNOSIS — Z79811 Long term (current) use of aromatase inhibitors: Secondary | ICD-10-CM | POA: Insufficient documentation

## 2016-12-07 DIAGNOSIS — C50211 Malignant neoplasm of upper-inner quadrant of right female breast: Secondary | ICD-10-CM

## 2016-12-07 DIAGNOSIS — Z923 Personal history of irradiation: Secondary | ICD-10-CM | POA: Diagnosis not present

## 2016-12-07 DIAGNOSIS — I252 Old myocardial infarction: Secondary | ICD-10-CM | POA: Diagnosis not present

## 2016-12-07 DIAGNOSIS — Z955 Presence of coronary angioplasty implant and graft: Secondary | ICD-10-CM | POA: Insufficient documentation

## 2016-12-07 DIAGNOSIS — E7800 Pure hypercholesterolemia, unspecified: Secondary | ICD-10-CM | POA: Diagnosis not present

## 2016-12-07 DIAGNOSIS — F419 Anxiety disorder, unspecified: Secondary | ICD-10-CM | POA: Insufficient documentation

## 2016-12-07 DIAGNOSIS — I10 Essential (primary) hypertension: Secondary | ICD-10-CM | POA: Insufficient documentation

## 2016-12-07 DIAGNOSIS — Z7982 Long term (current) use of aspirin: Secondary | ICD-10-CM | POA: Insufficient documentation

## 2016-12-07 NOTE — Progress Notes (Signed)
Patient denies any concerns today.  

## 2017-01-01 ENCOUNTER — Telehealth: Payer: Self-pay | Admitting: *Deleted

## 2017-01-01 MED ORDER — ANASTROZOLE 1 MG PO TABS: 1.0000 mg | ORAL_TABLET | Freq: Every day | ORAL | 1 refills | Status: DC

## 2017-01-01 NOTE — Telephone Encounter (Signed)
Notified patient that her Arimidex had been refilled.

## 2017-01-18 DIAGNOSIS — R42 Dizziness and giddiness: Secondary | ICD-10-CM | POA: Insufficient documentation

## 2017-01-27 ENCOUNTER — Ambulatory Visit
Admission: RE | Admit: 2017-01-27 | Discharge: 2017-01-27 | Disposition: A | Discharge status: Home / Self Care | Payer: Medicare PPO | Source: Ambulatory Visit | Attending: Oncology | Admitting: Oncology

## 2017-01-27 DIAGNOSIS — C50211 Malignant neoplasm of upper-inner quadrant of right female breast: Secondary | ICD-10-CM | POA: Diagnosis not present

## 2017-01-27 DIAGNOSIS — M8589 Other specified disorders of bone density and structure, multiple sites: Secondary | ICD-10-CM | POA: Diagnosis not present

## 2017-01-27 DIAGNOSIS — R921 Mammographic calcification found on diagnostic imaging of breast: Secondary | ICD-10-CM | POA: Insufficient documentation

## 2017-06-13 NOTE — Progress Notes (Deleted)
[facility]ri [name]  Telephone:(336) [phone] Fax:(336) [phone]  ID: Sherri [name] OB: February 03, 1933  MR#: [id number]  WUX#:[id number]  Patient Care Team: [name], MD as PCP - General (Family Medicine)  CHIEF COMPLAINT: Stage IIb ER/PR positive, upper inner quadrant right breast.  INTERVAL HISTORY: Patient returns to clinic today for routine 6 month evaluation. She continues to feel well and is asymptomatic. She is tolerating Arimidex well without significant side effects. She has no neurological complaints. She denies any recent fevers or illnesses. She denies any chest pain or shortness of breath. She has a good appetite and denies weight loss. She has no nausea, vomiting, constipation, or diarrhea. She has no urinary complaints. Patient offers no specific complaints today.   REVIEW OF SYSTEMS:   Review of Systems  Constitutional: Negative for diaphoresis, fever, malaise/fatigue and weight loss.  Respiratory: Negative.  Negative for cough and shortness of breath.   Cardiovascular: Negative.  Negative for chest pain.  Gastrointestinal: Negative.  Negative for abdominal pain.  Genitourinary: Negative.   Musculoskeletal: Positive for joint pain.  Neurological: Negative.  Negative for sensory change and weakness.  Psychiatric/Behavioral: Negative.     As per HPI. Otherwise, a complete review of systems is negative.  PAST MEDICAL HISTORY: Past Medical History:  Diagnosis Date  . Anxiety   . Breast cancer ([place]) 2013   RIGHT lumpectomy  . Bursitis of elbow    RIGHT  . Cancer [facility]) 2013   right breast lumpectomy with a few nodes removed  . Colitis   . Coronary artery disease   . Diverticulitis   . Dysrhythmia    according to anxiety, tachy  . GERD (gastroesophageal reflux disease)    okay since took her gallbladder out  . H/O lumpectomy   . Heart disease   . History of coronary artery stent placement 2007   one stent  . HOH (hard of hearing)   .  Hypercholesteremia   . Hypertension   . Myocardial infarction ([place])    2007  . Osteopenia   . Palpitations   . Skin cancer     PAST SURGICAL HISTORY: Past Surgical History:  Procedure Laterality Date  . BREAST LUMPECTOMY Right 2013   lymph node dissection  . CATARACT EXTRACTION W/PHACO Right 07/23/2015   Procedure: CATARACT EXTRACTION PHACO AND INTRAOCULAR LENS PLACEMENT (IOC);  Surgeon: Birder Robson, MD;  Location: [facility];  Service: Ophthalmology;  Laterality: Right;  [place] 01:24 AP% 26.7 CDE 22.47 fluid pack lot # [id number]  . CATARACT EXTRACTION W/PHACO Left 08/13/2015   Procedure: CATARACT EXTRACTION PHACO AND INTRAOCULAR LENS PLACEMENT (IOC);  Surgeon: Birder Robson, MD;  Location: [facility];  Service: Ophthalmology;  Laterality: Left;  [place] 02:40 AP% 30.7 CDE 49.28 fluid pack lot # [id number]  . CHOLECYSTECTOMY    . COLONOSCOPY    . CORONARY ANGIOPLASTY  2007  . EYE SURGERY     bilateral cataract  . HARDWARE REMOVAL Right 04/29/2016   Procedure: HARDWARE REMOVAL;  Surgeon: Hessie Knows, MD;  Location: [facility];  Service: Orthopedics;  Laterality: Right;  . ORIF ELBOW FRACTURE Right 10/01/2015   Procedure: OPEN REDUCTION INTERNAL FIXATION (ORIF) ELBOW/OLECRANON FRACTURE;  Surgeon: Hessie Knows, MD;  Location: [facility];  Service: Orthopedics;  Laterality: Right;    FAMILY HISTORY: Reviewed and unchanged. No reported history of malignancy or chronic disease.     ADVANCED DIRECTIVES:    HEALTH MAINTENANCE: Social History   Tobacco Use  . Smoking status: Never Smoker  . Smokeless tobacco:  Never Used  Substance Use Topics  . Alcohol use: Yes    Comment: wine occasionally  . Drug use: No     Colonoscopy:  PAP:  Bone density:  Lipid panel:  Allergies  Allergen Reactions  . Prednisone Other (See Comments)    Mood alterations    Current Outpatient Medications  Medication Sig Dispense Refill  . alendronate (FOSAMAX) 70 MG tablet Take 70 mg by mouth once a  week. Take with a full glass of water on an empty stomach.    Marland Kitchen anastrozole (ARIMIDEX) 1 MG tablet Take 1 tablet (1 mg total) by mouth daily. 90 tablet 1  . aspirin 81 MG tablet Take 81 mg by mouth at bedtime.    Marland Kitchen CALCIUM CITRATE PO Take by mouth.    . carvedilol (COREG) 6.25 MG tablet Take 6.25 mg by mouth 2 (two) times daily.    . Cholecalciferol (VITAMIN D-3 PO) Take 2,000 mg by mouth at bedtime.     . Coenzyme Q10 (CO Q 10 PO) Take 100 mg by mouth daily.    Marland Kitchen HYDROcodone-acetaminophen (NORCO) 5-325 MG tablet Take 1 tablet by mouth every 6 (six) hours as needed for moderate pain. 30 tablet 0  . lisinopril (PRINIVIL,ZESTRIL) 10 MG tablet Take 5 mg by mouth 2 (two) times daily.    . simvastatin (ZOCOR) 40 MG tablet Take 40 mg by mouth at bedtime.     No current facility-administered medications for this visit.     OBJECTIVE: There were no vitals filed for this visit.   There is no height or weight on file to calculate BMI.    ECOG FS:0 - Asymptomatic  General: Well-developed, well-nourished, no acute distress. Eyes: Pink conjunctiva, anicteric sclera. Breasts: Bilateral breasts and axilla without lumps or masses. Lungs: Clear to auscultation bilaterally. Heart: Regular rate and rhythm. No rubs, murmurs, or gallops. Abdomen: Soft, nontender, nondistended. No organomegaly noted, normoactive bowel sounds. Musculoskeletal: No edema, cyanosis, or clubbing. Neuro: Alert, answering all questions appropriately. Cranial nerves grossly intact. Skin: No rashes or petechiae noted. Psych: Normal affect.   LAB RESULTS:  No results found for: NA, K, CL, CO2, GLUCOSE, BUN, CREATININE, CALCIUM, PROT, ALBUMIN, AST, ALT, ALKPHOS, BILITOT, GFRNONAA, GFRAA  No results found for: WBC, NEUTROABS, HGB, HCT, MCV, PLT   STUDIES: No results found.  ASSESSMENT: Stage IIb ER/PR positive, upper inner quadrant right breast.  PLAN:    1. Stage IIb ER/PR positive, upper inner quadrant right breast:  Although we do not have access to patient's pathology report, her stage and location of breast were determined based on clinic notes. AJCC staging is also unavailable. Patient received 6 months of Arimidex between April 2013 and October 2013 prior to undergoing right breast lumpectomy. She reports she underwent adjuvant XRT, but did not receive adjuvant chemotherapy. Patient will complete 5 years of Arimidex in October 2018.  Her most recent mammogram on August 01, 2016 was reported as BI-RADS 3, repeat bilateral mammogram in August 2018. Return to clinic in 6 months for further evaluation. 2. Osteopenia: Patient's bone density on January 15, 2016 reported T score of -2.2. Patient cannot take calcium supplementation secondary to underlying colitis. Continue Fosamax and vitamin D.  Repeat in August 2018. 3. Hypertension: Patient's blood pressure is mildly elevated today. Continue monitoring and treatment per primary care.   Patient expressed understanding and was in agreement with this plan. She also understands that She can call clinic at any time with any questions, concerns, or complaints.  Lloyd Huger, MD   06/13/2017 2:00 PM

## 2017-06-14 ENCOUNTER — Telehealth: Payer: Self-pay | Admitting: Oncology

## 2017-06-14 ENCOUNTER — Inpatient Hospital Stay: Payer: Medicare PPO | Admitting: Oncology

## 2017-06-14 ENCOUNTER — Encounter: Payer: Self-pay | Admitting: Oncology

## 2017-06-14 NOTE — Telephone Encounter (Signed)
Ok, thank you

## 2017-06-14 NOTE — Telephone Encounter (Signed)
Number not in working order for Sherri [name] and the number for relative has a Hydrologist for calls. I attempted to bypass multiple times but it would not recognize my name or the facility. Will mail patient letter to call to r\s missed appt.

## 2017-06-21 NOTE — Progress Notes (Signed)
[place]  Telephone:(336) [phone] Fax:(336) [phone]  ID: [name] OB: 1933-04-08  MR#: [id number]  YQI#:[id number]  Patient Care Team: [name], MD as PCP - General (Family Medicine)  CHIEF COMPLAINT: Stage IIb ER/PR positive, upper inner quadrant right breast.  INTERVAL HISTORY: Patient returns to clinic today for routine 6 month evaluation. She continues to feel well and is asymptomatic. She discontinued Arimidex after completing 5 years of treatment in October 2018.  She well without significant side effects. She has no neurological complaints. She denies any recent fevers or illnesses. She denies any chest pain or shortness of breath. She has a good appetite and denies weight loss. She has no nausea, vomiting, constipation, or diarrhea. She has no urinary complaints. Patient offers no specific complaints today.   REVIEW OF SYSTEMS:   Review of Systems  Constitutional: Negative for diaphoresis, fever, malaise/fatigue and weight loss.  Respiratory: Negative.  Negative for cough and shortness of breath.   Cardiovascular: Negative.  Negative for chest pain.  Gastrointestinal: Negative.  Negative for abdominal pain.  Genitourinary: Negative.   Musculoskeletal: Positive for joint pain.  Skin: Negative.  Negative for rash.  Neurological: Negative.  Negative for sensory change and weakness.  Psychiatric/Behavioral: Negative.  The patient is not nervous/anxious.     As per HPI. Otherwise, a complete review of systems is negative.  PAST MEDICAL HISTORY: Past Medical History:  Diagnosis Date  . Anxiety   . Breast cancer ([place]) 2013   RIGHT lumpectomy  . Bursitis of elbow    RIGHT  . Cancer [facility]) 2013   right breast lumpectomy with a few nodes removed  . Colitis   . Coronary artery disease   . Diverticulitis   . Dysrhythmia    according to anxiety, tachy  . GERD (gastroesophageal reflux disease)    okay since took her gallbladder out  . H/O  lumpectomy   . Heart disease   . History of coronary artery stent placement 2007   one stent  . HOH (hard of hearing)   . Hypercholesteremia   . Hypertension   . Myocardial infarction ([place])    2007  . Osteopenia   . Palpitations   . Skin cancer     PAST SURGICAL HISTORY: Past Surgical History:  Procedure Laterality Date  . BREAST LUMPECTOMY Right 2013   lymph node dissection  . CATARACT EXTRACTION W/PHACO Right 07/23/2015   Procedure: CATARACT EXTRACTION PHACO AND INTRAOCULAR LENS PLACEMENT (IOC);  Surgeon: Birder Robson, MD;  Location: [facility];  Service: Ophthalmology;  Laterality: Right;  [place] 01:24 AP% 26.7 CDE 22.47 fluid pack lot # [id number]  . CATARACT EXTRACTION W/PHACO Left 08/13/2015   Procedure: CATARACT EXTRACTION PHACO AND INTRAOCULAR LENS PLACEMENT (IOC);  Surgeon: Birder Robson, MD;  Location: [facility];  Service: Ophthalmology;  Laterality: Left;  [place] 02:40 AP% 30.7 CDE 49.28 fluid pack lot # [id number]  . CHOLECYSTECTOMY    . COLONOSCOPY    . CORONARY ANGIOPLASTY  2007  . EYE SURGERY     bilateral cataract  . HARDWARE REMOVAL Right 04/29/2016   Procedure: HARDWARE REMOVAL;  Surgeon: Hessie Knows, MD;  Location: [facility];  Service: Orthopedics;  Laterality: Right;  . ORIF ELBOW FRACTURE Right 10/01/2015   Procedure: OPEN REDUCTION INTERNAL FIXATION (ORIF) ELBOW/OLECRANON FRACTURE;  Surgeon: Hessie Knows, MD;  Location: [facility] OR[facility];  Service: Orthopedics;  Laterality: Right;    FAMILY HISTORY: Reviewed and unchanged. No reported history of malignancy or chronic disease.  ADVANCED DIRECTIVES:    HEALTH MAINTENANCE: Social History   Tobacco Use  . Smoking status: Never Smoker  . Smokeless tobacco: Never Used  Substance Use Topics  . Alcohol use: Yes    Comment: wine occasionally  . Drug use: No     Colonoscopy:  PAP:  Bone density:  Lipid panel:  Allergies  Allergen Reactions  . Prednisone Other (See Comments)    Mood alterations    Current  Outpatient Medications  Medication Sig Dispense Refill  . aspirin 81 MG tablet Take 81 mg by mouth at bedtime.    . carvedilol (COREG) 6.25 MG tablet Take 6.25 mg by mouth 2 (two) times daily.    . Cholecalciferol (VITAMIN D-3 PO) Take 2,000 mg by mouth at bedtime.     . Coenzyme Q10 (CO Q 10 PO) Take 100 mg by mouth daily.    Marland Kitchen HYDROcodone-acetaminophen (NORCO) 5-325 MG tablet Take 1 tablet by mouth every 6 (six) hours as needed for moderate pain. 30 tablet 0  . lisinopril (PRINIVIL,ZESTRIL) 10 MG tablet Take 5 mg by mouth 2 (two) times daily.    . simvastatin (ZOCOR) 40 MG tablet Take 40 mg by mouth at bedtime.     No current facility-administered medications for this visit.     OBJECTIVE: Vitals:   06/22/17 1042  BP: 139/67  Pulse: (!) 56  Resp: 20  Temp: (!) 97.3 F (36.3 C)     Body mass index is 25.48 kg/m.    ECOG FS:0 - Asymptomatic  General: Well-developed, well-nourished, no acute distress. Eyes: Pink conjunctiva, anicteric sclera. Breasts: Bilateral breasts and axilla without lumps or masses. Lungs: Clear to auscultation bilaterally. Heart: Regular rate and rhythm. No rubs, murmurs, or gallops. Abdomen: Soft, nontender, nondistended. No organomegaly noted, normoactive bowel sounds. Musculoskeletal: No edema, cyanosis, or clubbing. Neuro: Alert, answering all questions appropriately. Cranial nerves grossly intact. Skin: No rashes or petechiae noted. Psych: Normal affect.   LAB RESULTS:  No results found for: NA, K, CL, CO2, GLUCOSE, BUN, CREATININE, CALCIUM, PROT, ALBUMIN, AST, ALT, ALKPHOS, BILITOT, GFRNONAA, GFRAA  No results found for: WBC, NEUTROABS, HGB, HCT, MCV, PLT   STUDIES: No results found.  ASSESSMENT: Stage IIb ER/PR positive, upper inner quadrant right breast.  PLAN:    1. Stage IIb ER/PR positive, upper inner quadrant right breast: Although we do not have access to patient's pathology report, her stage and location of breast were determined  based on clinic notes. AJCC staging is also unavailable. Patient received 6 months of Arimidex between April 2013 and October 2013 prior to undergoing right breast lumpectomy. She reports she underwent adjuvant XRT, but did not receive adjuvant chemotherapy.  Patient completed 5 years of Arimidex in October 2018.  Her most recent mammogram on January 27, 2017 was reported as BI-RADS 3, repeat bilateral mammogram in August 2019. Return to clinic in 1 year for routine evaluation. 2. Osteopenia: Patient's bone density on January 27, 2017 reported T score of -2.0, which is improved over one year prior.  Patient has now discontinued Fosamax, but continues to take calcium and vitamin D supplementation.  Continue monitoring per primary care.  3. Hypertension: Patient's blood pressure is within normal limits today. Continue monitoring and treatment per primary care.  Approximately 20 minutes was spent in discussion of which greater than 50% was consultation.  Patient expressed understanding and was in agreement with this plan. She also understands that She can call clinic at any time with any questions, concerns, or  complaints.    Lloyd Huger, MD   06/22/2017 11:08 AM

## 2017-06-22 ENCOUNTER — Inpatient Hospital Stay: Payer: Medicare PPO | Attending: Oncology | Admitting: Oncology

## 2017-06-22 ENCOUNTER — Other Ambulatory Visit: Payer: Self-pay

## 2017-06-22 ENCOUNTER — Encounter: Payer: Self-pay | Admitting: Oncology

## 2017-06-22 VITALS — BP 139/67 | HR 56 | Temp 97.3°F | Resp 20 | Wt 139.3 lb

## 2017-06-22 DIAGNOSIS — I2510 Atherosclerotic heart disease of native coronary artery without angina pectoris: Secondary | ICD-10-CM | POA: Insufficient documentation

## 2017-06-22 DIAGNOSIS — K219 Gastro-esophageal reflux disease without esophagitis: Secondary | ICD-10-CM | POA: Diagnosis not present

## 2017-06-22 DIAGNOSIS — F419 Anxiety disorder, unspecified: Secondary | ICD-10-CM | POA: Insufficient documentation

## 2017-06-22 DIAGNOSIS — I252 Old myocardial infarction: Secondary | ICD-10-CM | POA: Insufficient documentation

## 2017-06-22 DIAGNOSIS — I10 Essential (primary) hypertension: Secondary | ICD-10-CM | POA: Insufficient documentation

## 2017-06-22 DIAGNOSIS — E7800 Pure hypercholesterolemia, unspecified: Secondary | ICD-10-CM | POA: Insufficient documentation

## 2017-06-22 DIAGNOSIS — Z85828 Personal history of other malignant neoplasm of skin: Secondary | ICD-10-CM | POA: Insufficient documentation

## 2017-06-22 DIAGNOSIS — Z170 Estrogen receptor positive status [ER+]: Secondary | ICD-10-CM | POA: Insufficient documentation

## 2017-06-22 DIAGNOSIS — Z923 Personal history of irradiation: Secondary | ICD-10-CM

## 2017-06-22 DIAGNOSIS — Z853 Personal history of malignant neoplasm of breast: Secondary | ICD-10-CM | POA: Diagnosis not present

## 2017-06-22 DIAGNOSIS — Z955 Presence of coronary angioplasty implant and graft: Secondary | ICD-10-CM | POA: Insufficient documentation

## 2017-06-22 DIAGNOSIS — Z9223 Personal history of estrogen therapy: Secondary | ICD-10-CM | POA: Diagnosis not present

## 2017-06-22 DIAGNOSIS — Z7982 Long term (current) use of aspirin: Secondary | ICD-10-CM | POA: Diagnosis not present

## 2017-06-22 DIAGNOSIS — M8580 Other specified disorders of bone density and structure, unspecified site: Secondary | ICD-10-CM | POA: Diagnosis not present

## 2017-06-22 DIAGNOSIS — Z79899 Other long term (current) drug therapy: Secondary | ICD-10-CM | POA: Diagnosis not present

## 2017-06-22 DIAGNOSIS — C50211 Malignant neoplasm of upper-inner quadrant of right female breast: Secondary | ICD-10-CM

## 2017-06-22 NOTE — Progress Notes (Signed)
Patient denies any concerns today.  

## 2017-07-21 DIAGNOSIS — I6523 Occlusion and stenosis of bilateral carotid arteries: Secondary | ICD-10-CM | POA: Insufficient documentation

## 2017-07-21 DIAGNOSIS — R001 Bradycardia, unspecified: Secondary | ICD-10-CM | POA: Insufficient documentation

## 2018-01-28 ENCOUNTER — Ambulatory Visit
Admission: RE | Admit: 2018-01-28 | Discharge: 2018-01-28 | Disposition: A | Discharge status: Home / Self Care | Payer: Medicare PPO | Source: Ambulatory Visit | Attending: Oncology | Admitting: Oncology

## 2018-01-28 DIAGNOSIS — C50211 Malignant neoplasm of upper-inner quadrant of right female breast: Secondary | ICD-10-CM

## 2018-01-28 HISTORY — DX: Personal history of irradiation: Z92.3

## 2018-06-26 NOTE — Progress Notes (Signed)
[name]  [phone] [phone]  ID: [name] OB: 05/03/1933  MR#: [id number]  HER#:[id number]  Patient Care Team: [name], MD as PCP - General (Family Medicine)  CHIEF COMPLAINT: Stage IIb ER/PR positive, upper inner quadrant right breast.  INTERVAL HISTORY: Patient returns to clinic today for routine 44-month evaluation.  She continues to feel well and remains asymptomatic. She has no neurological complaints. She denies any recent fevers or illnesses. She denies any chest pain or shortness of breath. She has a good appetite and denies weight loss. She has no nausea, vomiting, constipation, or diarrhea. She has no urinary complaints.  Patient feels at her baseline offers no specific complaints today.  REVIEW OF SYSTEMS:   Review of Systems  Constitutional: Negative.  Negative for diaphoresis, fever, malaise/fatigue and weight loss.  Respiratory: Negative.  Negative for cough and shortness of breath.   Cardiovascular: Negative.  Negative for chest pain and leg swelling.  Gastrointestinal: Negative.  Negative for abdominal pain.  Genitourinary: Negative.  Negative for dysuria.  Musculoskeletal: Negative.  Negative for joint pain.  Skin: Negative.  Negative for rash.  Neurological: Negative.  Negative for sensory change, weakness and headaches.  Psychiatric/Behavioral: Negative.  The patient is not nervous/anxious.     As per HPI. Otherwise, a complete review of systems is negative.  PAST MEDICAL HISTORY: Past Medical History:  Diagnosis Date  . Anxiety   . Breast cancer ([place]) 2013   RIGHT lumpectomy  . Bursitis of elbow    RIGHT  . Cancer [facility]) 2013   right breast lumpectomy with a few nodes removed  . Colitis   . Coronary artery disease   . Diverticulitis   . Dysrhythmia    according to anxiety, tachy  . GERD (gastroesophageal reflux disease)    okay since took her gallbladder out  . H/O lumpectomy   . Heart  disease   . History of coronary artery stent placement 2007   one stent  . HOH (hard of hearing)   . Hypercholesteremia   . Hypertension   . Myocardial infarction ([place])    2007  . Osteopenia   . Palpitations   . Personal history of radiation therapy   . Skin cancer     PAST SURGICAL HISTORY: Past Surgical History:  Procedure Laterality Date  . BREAST LUMPECTOMY Right 2013   lymph node dissection  . CATARACT EXTRACTION W/PHACO Right 07/23/2015   Procedure: CATARACT EXTRACTION PHACO AND INTRAOCULAR LENS PLACEMENT (IOC);  Surgeon: Birder Robson, MD;  Location: [facility] ORS;  Service: Ophthalmology;  Laterality: Right;  [place] 01:24 AP% 26.7 CDE 22.47 fluid pack lot # [id number]  . CATARACT EXTRACTION W/PHACO Left 08/13/2015   Procedure: CATARACT EXTRACTION PHACO AND INTRAOCULAR LENS PLACEMENT (IOC);  Surgeon: Birder Robson, MD;  Location: [facility] ORS;  Service: Ophthalmology;  Laterality: Left;  [place] 02:40 AP% 30.7 CDE 49.28 fluid pack lot # [id number]  . CHOLECYSTECTOMY    . COLONOSCOPY    . CORONARY ANGIOPLASTY  2007  . EYE SURGERY     bilateral cataract  . HARDWARE REMOVAL Right 04/29/2016   Procedure: HARDWARE REMOVAL;  Surgeon: Hessie Knows, MD;  Location: [facility] ORS;  Service: Orthopedics;  Laterality: Right;  . ORIF ELBOW FRACTURE Right 10/01/2015   Procedure: OPEN REDUCTION INTERNAL FIXATION (ORIF) ELBOW/OLECRANON FRACTURE;  Surgeon: Hessie Knows, MD;  Location: [facility] ORS;  Service: Orthopedics;  Laterality: Right;    FAMILY HISTORY: Reviewed and unchanged. No reported history of malignancy or chronic disease.  ADVANCED DIRECTIVES:    HEALTH MAINTENANCE: Social History   Tobacco Use  . Smoking status: Never Smoker  . Smokeless tobacco: Never Used  Substance Use Topics  . Alcohol use: Yes    Comment: wine occasionally  . Drug use: No     Colonoscopy:  PAP:  Bone density:  Lipid panel:  Allergies  Allergen Reactions  . Prednisone Other (See Comments)    Mood  alterations    Current Outpatient Medications  Medication Sig Dispense Refill  . carvedilol (COREG) 6.25 MG tablet Take 6.25 mg by mouth 2 (two) times daily.    . Cholecalciferol (VITAMIN D-3 PO) Take 2,000 mg by mouth at bedtime.     . Coenzyme Q10 (CO Q 10 PO) Take 100 mg by mouth daily.    Marland Kitchen lisinopril (PRINIVIL,ZESTRIL) 10 MG tablet Take 5 mg by mouth 2 (two) times daily.    . nitroGLYCERIN (NITROSTAT) 0.4 MG SL tablet Place under the tongue.    . simvastatin (ZOCOR) 40 MG tablet Take 40 mg by mouth at bedtime.     No current facility-administered medications for this visit.     OBJECTIVE: Vitals:   06/28/18 1105  BP: (!) 148/75  Pulse: (!) 55  Temp: 97.7 F (36.5 C)     Body mass index is 24.3 kg/m.    ECOG FS:0 - Asymptomatic  General: Well-developed, well-nourished, no acute distress. Eyes: Pink conjunctiva, anicteric sclera. HEENT: Normocephalic, moist mucous membranes. Breast: Bilateral breast and axilla without lumps or masses. Lungs: Clear to auscultation bilaterally. Heart: Regular rate and rhythm. No rubs, murmurs, or gallops. Abdomen: Soft, nontender, nondistended. No organomegaly noted, normoactive bowel sounds. Musculoskeletal: No edema, cyanosis, or clubbing. Neuro: Alert, answering all questions appropriately. Cranial nerves grossly intact. Skin: No rashes or petechiae noted. Psych: Normal affect.  LAB RESULTS:  No results found for: NA, K, CL, CO2, GLUCOSE, BUN, CREATININE, CALCIUM, PROT, ALBUMIN, AST, ALT, ALKPHOS, BILITOT, GFRNONAA, GFRAA  No results found for: WBC, NEUTROABS, HGB, HCT, MCV, PLT   STUDIES: No results found.  ASSESSMENT: Stage IIb ER/PR positive, upper inner quadrant right breast.  PLAN:    1. Stage IIb ER/PR positive, upper inner quadrant right breast: Although we do not have access to patient's pathology report, her stage and location of breast were determined based on clinic notes. AJCC staging is also unavailable. Patient  received 6 months of Arimidex between April 2013 and October 2013 prior to undergoing right breast lumpectomy. She reports she underwent adjuvant XRT, but did not receive adjuvant chemotherapy.  Patient completed 5 years of Arimidex in October 2018.  Her most recent mammogram on January 28, 2018 was reported as BI-RADS 2.  Repeat screening breast mammogram in August 2020.  After lengthy discussion with the patient, it was agreed upon that no further follow-up is necessary and that breast exams and mammograms can now be done by her primary care physician.  Please refer patient back if there are any questions or concerns.  2. Osteopenia: Patient's bone density on January 27, 2017 reported T score of -2.0, which is improved over one year prior.  Patient has now discontinued Fosamax, but continues to take calcium and vitamin D supplementation.  Continue monitoring per primary care.  3. Hypertension: Patient's blood pressure is mildly elevated today.  Continue monitoring and treatment per primary care.  Patient expressed understanding and was in agreement with this plan. She also understands that She can call clinic at any time with any questions, concerns, or complaints.  Lloyd Huger, MD   06/29/2018 7:03 AM

## 2018-06-28 ENCOUNTER — Inpatient Hospital Stay: Payer: Medicare PPO | Attending: Oncology | Admitting: Oncology

## 2018-06-28 ENCOUNTER — Other Ambulatory Visit: Payer: Self-pay

## 2018-06-28 VITALS — BP 148/75 | HR 55 | Temp 97.7°F | Ht 63.0 in | Wt 137.2 lb

## 2018-06-28 DIAGNOSIS — I10 Essential (primary) hypertension: Secondary | ICD-10-CM | POA: Diagnosis not present

## 2018-06-28 DIAGNOSIS — Z79899 Other long term (current) drug therapy: Secondary | ICD-10-CM | POA: Diagnosis not present

## 2018-06-28 DIAGNOSIS — C50211 Malignant neoplasm of upper-inner quadrant of right female breast: Secondary | ICD-10-CM | POA: Diagnosis not present

## 2018-06-28 DIAGNOSIS — Z170 Estrogen receptor positive status [ER+]: Secondary | ICD-10-CM | POA: Diagnosis not present

## 2018-06-28 DIAGNOSIS — M8580 Other specified disorders of bone density and structure, unspecified site: Secondary | ICD-10-CM | POA: Diagnosis not present

## 2018-06-28 NOTE — Progress Notes (Signed)
Patient is here today to follow up on her primary cancer of upper inner quadrant of right female breast. Patient stated that she had been doing well with no complaints. Patient denied pain, lumps/knots, nipple discharge or skin discoloration. Patient's last mammogram was on 01/27/2018 and it was benign.

## 2018-12-21 ENCOUNTER — Other Ambulatory Visit: Payer: Self-pay | Admitting: Family Medicine

## 2018-12-21 DIAGNOSIS — Z1231 Encounter for screening mammogram for malignant neoplasm of breast: Secondary | ICD-10-CM

## 2019-01-30 ENCOUNTER — Ambulatory Visit
Admission: RE | Admit: 2019-01-30 | Discharge: 2019-01-30 | Disposition: A | Discharge status: Home / Self Care | Payer: Medicare PPO | Source: Ambulatory Visit | Attending: Family Medicine | Admitting: Family Medicine

## 2019-01-30 DIAGNOSIS — Z1231 Encounter for screening mammogram for malignant neoplasm of breast: Secondary | ICD-10-CM | POA: Insufficient documentation

## 2019-11-10 ENCOUNTER — Other Ambulatory Visit: Payer: Self-pay | Admitting: Family Medicine

## 2019-11-10 ENCOUNTER — Other Ambulatory Visit (HOSPITAL_COMMUNITY): Payer: Self-pay | Admitting: Family Medicine

## 2019-11-10 DIAGNOSIS — R918 Other nonspecific abnormal finding of lung field: Secondary | ICD-10-CM

## 2019-11-10 DIAGNOSIS — R9389 Abnormal findings on diagnostic imaging of other specified body structures: Secondary | ICD-10-CM

## 2019-11-13 ENCOUNTER — Other Ambulatory Visit: Payer: Self-pay | Admitting: Cardiology

## 2019-11-13 ENCOUNTER — Other Ambulatory Visit: Payer: Self-pay

## 2019-11-13 ENCOUNTER — Ambulatory Visit
Admission: RE | Admit: 2019-11-13 | Discharge: 2019-11-13 | Disposition: A | Discharge status: Home / Self Care | Payer: Medicare PPO | Source: Ambulatory Visit | Attending: Cardiology | Admitting: Cardiology

## 2019-11-13 DIAGNOSIS — M7989 Other specified soft tissue disorders: Secondary | ICD-10-CM

## 2019-11-16 ENCOUNTER — Ambulatory Visit
Admission: RE | Admit: 2019-11-16 | Discharge: 2019-11-16 | Disposition: A | Discharge status: Home / Self Care | Payer: Medicare PPO | Source: Ambulatory Visit | Attending: Family Medicine | Admitting: Family Medicine

## 2019-11-16 ENCOUNTER — Other Ambulatory Visit: Payer: Self-pay

## 2019-11-16 DIAGNOSIS — R918 Other nonspecific abnormal finding of lung field: Secondary | ICD-10-CM

## 2019-11-16 DIAGNOSIS — R9389 Abnormal findings on diagnostic imaging of other specified body structures: Secondary | ICD-10-CM | POA: Insufficient documentation

## 2019-11-27 ENCOUNTER — Encounter: Payer: Self-pay | Admitting: Oncology

## 2019-11-28 ENCOUNTER — Encounter: Payer: Self-pay | Admitting: Oncology

## 2019-11-28 ENCOUNTER — Inpatient Hospital Stay: Payer: Medicare PPO | Attending: Oncology | Admitting: Oncology

## 2019-11-28 ENCOUNTER — Other Ambulatory Visit: Payer: Self-pay

## 2019-11-28 ENCOUNTER — Inpatient Hospital Stay: Payer: Medicare PPO

## 2019-11-28 VITALS — BP 162/75 | HR 66 | Temp 97.0°F | Resp 20 | Ht 63.0 in | Wt 126.5 lb

## 2019-11-28 DIAGNOSIS — R590 Localized enlarged lymph nodes: Secondary | ICD-10-CM

## 2019-11-28 DIAGNOSIS — Z853 Personal history of malignant neoplasm of breast: Secondary | ICD-10-CM | POA: Insufficient documentation

## 2019-11-28 DIAGNOSIS — C50211 Malignant neoplasm of upper-inner quadrant of right female breast: Secondary | ICD-10-CM

## 2019-11-28 LAB — CBC WITH DIFFERENTIAL/PLATELET
Abs Immature Granulocytes: 0.05 10*3/uL (ref 0.00–0.07)
Basophils Absolute: 0.1 10*3/uL (ref 0.0–0.1)
Basophils Relative: 1 %
Eosinophils Absolute: 0.8 10*3/uL — ABNORMAL HIGH (ref 0.0–0.5)
Eosinophils Relative: 6 %
HCT: 38.9 % (ref 36.0–46.0)
Hemoglobin: 12.6 g/dL (ref 12.0–15.0)
Immature Granulocytes: 0 %
Lymphocytes Relative: 26 %
Lymphs Abs: 3.2 10*3/uL (ref 0.7–4.0)
MCH: 30 pg (ref 26.0–34.0)
MCHC: 32.4 g/dL (ref 30.0–36.0)
MCV: 92.6 fL (ref 80.0–100.0)
Monocytes Absolute: 1 10*3/uL (ref 0.1–1.0)
Monocytes Relative: 8 %
Neutro Abs: 7.3 10*3/uL (ref 1.7–7.7)
Neutrophils Relative %: 59 %
Platelets: 235 10*3/uL (ref 150–400)
RBC: 4.2 MIL/uL (ref 3.87–5.11)
RDW: 14.3 % (ref 11.5–15.5)
WBC: 12.4 10*3/uL — ABNORMAL HIGH (ref 4.0–10.5)
nRBC: 0 % (ref 0.0–0.2)

## 2019-11-28 LAB — COMPREHENSIVE METABOLIC PANEL
ALT: 11 U/L (ref 0–44)
AST: 21 U/L (ref 15–41)
Albumin: 4.2 g/dL (ref 3.5–5.0)
Alkaline Phosphatase: 54 U/L (ref 38–126)
Anion gap: 8 (ref 5–15)
BUN: 32 mg/dL — ABNORMAL HIGH (ref 8–23)
CO2: 26 mmol/L (ref 22–32)
Calcium: 9.3 mg/dL (ref 8.9–10.3)
Chloride: 98 mmol/L (ref 98–111)
Creatinine, Ser: 1.57 mg/dL — ABNORMAL HIGH (ref 0.44–1.00)
GFR calc Af Amer: 35 mL/min — ABNORMAL LOW (ref >60)
GFR calc non Af Amer: 30 mL/min — ABNORMAL LOW (ref >60)
Glucose, Bld: 84 mg/dL (ref 70–99)
Potassium: 5.3 mmol/L — ABNORMAL HIGH (ref 3.5–5.1)
Sodium: 132 mmol/L — ABNORMAL LOW (ref 135–145)
Total Bilirubin: 0.6 mg/dL (ref 0.3–1.2)
Total Protein: 8.2 g/dL — ABNORMAL HIGH (ref 6.5–8.1)

## 2019-11-28 NOTE — Progress Notes (Signed)
Patient reports shortness of breath today, states she has been with it more frequently in the past 2 months.

## 2019-11-28 NOTE — Progress Notes (Signed)
[place]  [phone] Fax:(336) [phone]  ID: [name] OB: 07-22-1932  MR#: [id number]  EXH#:[id number]  Patient Care Team: [name], MD as PCP - General (Family Medicine) Lloyd Huger, MD as Consulting Physician (Oncology)  CHIEF COMPLAINT: Mediastinal lymphadenopathy.  INTERVAL HISTORY: Patient is an 86 year old female who was last evaluated in clinic in January 2019.  She has noticed increasing shortness of breath over the past 1 to 2 months and subsequent imaging revealed bulky mediastinal lymphadenopathy concerning for recurrent breast cancer.  She otherwise feels well.  She has no neurologic complaints.  She denies any recent fevers or illnesses.  She has a good appetite and denies weight loss.  She has no chest pain, cough, or hemoptysis.  She denies any nausea, vomiting, constipation, or diarrhea.  She has no urinary complaints.  Patient otherwise feels well and offers no further specific complaints.    REVIEW OF SYSTEMS:   Review of Systems  Constitutional: Negative.  Negative for diaphoresis, fever, malaise/fatigue and weight loss.  Respiratory: Positive for shortness of breath. Negative for cough.   Cardiovascular: Negative.  Negative for chest pain and leg swelling.  Gastrointestinal: Negative.  Negative for abdominal pain.  Genitourinary: Negative.  Negative for dysuria.  Musculoskeletal: Negative.  Negative for joint pain.  Skin: Negative.  Negative for rash.  Neurological: Negative.  Negative for dizziness, sensory change, weakness and headaches.  Psychiatric/Behavioral: Negative.  The patient is not nervous/anxious.     As per HPI. Otherwise, a complete review of systems is negative.  PAST MEDICAL HISTORY: Past Medical History:  Diagnosis Date  . Anxiety   . Breast cancer ([place]) 2013   RIGHT lumpectomy  . Bursitis of elbow    RIGHT  . Cancer [facility]) 2013   right breast lumpectomy with a few nodes removed  .  Colitis   . Coronary artery disease   . Diverticulitis   . Dysrhythmia    according to anxiety, tachy  . GERD (gastroesophageal reflux disease)    okay since took her gallbladder out  . H/O lumpectomy   . Heart disease   . History of coronary artery stent placement 2007   one stent  . HOH (hard of hearing)   . Hypercholesteremia   . Hypertension   . Myocardial infarction ([place])    2007  . Osteopenia   . Palpitations   . Personal history of radiation therapy   . Skin cancer     PAST SURGICAL HISTORY: Past Surgical History:  Procedure Laterality Date  . BREAST LUMPECTOMY Right 2013   lymph node dissection  . CATARACT EXTRACTION W/PHACO Right 07/23/2015   Procedure: CATARACT EXTRACTION PHACO AND INTRAOCULAR LENS PLACEMENT (IOC);  Surgeon: Birder Robson, MD;  Location: [facility] ORS;  Service: Ophthalmology;  Laterality: Right;  [place] 01:24 AP% 26.7 CDE 22.47 fluid pack lot # [id number]  . CATARACT EXTRACTION W/PHACO Left 08/13/2015   Procedure: CATARACT EXTRACTION PHACO AND INTRAOCULAR LENS PLACEMENT (IOC);  Surgeon: Birder Robson, MD;  Location: [facility] ORS;  Service: Ophthalmology;  Laterality: Left;  [place] 02:40 AP% 30.7 CDE 49.28 fluid pack lot # [id number]  . CHOLECYSTECTOMY    . COLONOSCOPY    . CORONARY ANGIOPLASTY  2007  . EYE SURGERY     bilateral cataract  . HARDWARE REMOVAL Right 04/29/2016   Procedure: HARDWARE REMOVAL;  Surgeon: Hessie Knows, MD;  Location: [facility] ORS;  Service: Orthopedics;  Laterality: Right;  . ORIF ELBOW FRACTURE Right 10/01/2015   Procedure: OPEN REDUCTION  INTERNAL FIXATION (ORIF) ELBOW/OLECRANON FRACTURE;  Surgeon: Hessie Knows, MD;  Location: [facility] ORS;  Service: Orthopedics;  Laterality: Right;    FAMILY HISTORY: Reviewed and unchanged. No reported history of malignancy or chronic disease.     ADVANCED DIRECTIVES:    HEALTH MAINTENANCE: Social History   Tobacco Use  . Smoking status: Never Smoker  . Smokeless tobacco: Never Used  Vaping Use   . Vaping Use: Never used  Substance Use Topics  . Alcohol use: Yes    Comment: wine occasionally  . Drug use: No     Colonoscopy:  PAP:  Bone density:  Lipid panel:  Allergies  Allergen Reactions  . Prednisone Other (See Comments)    Mood alterations    Current Outpatient Medications  Medication Sig Dispense Refill  . carvedilol (COREG) 6.25 MG tablet Take 6.25 mg by mouth 2 (two) times daily.    . Cholecalciferol (VITAMIN D-3 PO) Take 2,000 mg by mouth at bedtime.     . Coenzyme Q10 (CO Q 10 PO) Take 100 mg by mouth daily.    Arna Medici 25 MCG tablet Take 25 mcg by mouth daily before breakfast.    . lisinopril (PRINIVIL,ZESTRIL) 10 MG tablet Take 5 mg by mouth 2 (two) times daily.    . nitroGLYCERIN (NITROSTAT) 0.4 MG SL tablet Place under the tongue.    . simvastatin (ZOCOR) 40 MG tablet Take 40 mg by mouth at bedtime.     No current facility-administered medications for this visit.    OBJECTIVE: Vitals:   11/27/19 1143  BP: (!) 162/75  Pulse: 66  Resp: 20  Temp: (!) 97 F (36.1 C)  SpO2: 99%     Body mass index is 22.41 kg/m.    ECOG FS:0 - Asymptomatic  General: Well-developed, well-nourished, no acute distress. Eyes: Pink conjunctiva, anicteric sclera. HEENT: Normocephalic, moist mucous membranes. Lungs: No audible wheezing or coughing. Heart: Regular rate and rhythm. Abdomen: Soft, nontender, no obvious distention. Musculoskeletal: No edema, cyanosis, or clubbing. Neuro: Alert, answering all questions appropriately. Cranial nerves grossly intact. Skin: No rashes or petechiae noted. Psych: Normal affect.   LAB RESULTS:  Lab Results  Component Value Date   NA 132 (L) 11/28/2019   K 5.3 (H) 11/28/2019   CL 98 11/28/2019   CO2 26 11/28/2019   GLUCOSE 84 11/28/2019   BUN 32 (H) 11/28/2019   CREATININE 1.57 (H) 11/28/2019   CALCIUM 9.3 11/28/2019   PROT 8.2 (H) 11/28/2019   ALBUMIN 4.2 11/28/2019   AST 21 11/28/2019   ALT 11 11/28/2019    ALKPHOS 54 11/28/2019   BILITOT 0.6 11/28/2019   GFRNONAA 30 (L) 11/28/2019   GFRAA 35 (L) 11/28/2019    Lab Results  Component Value Date   WBC 12.4 (H) 11/28/2019   NEUTROABS 7.3 11/28/2019   HGB 12.6 11/28/2019   HCT 38.9 11/28/2019   MCV 92.6 11/28/2019   PLT 235 11/28/2019     STUDIES: CT CHEST WO CONTRAST  Result Date: 11/20/2019 CLINICAL DATA:  Shortness of breath on exertion for 2 months. EXAM: CT CHEST WITHOUT CONTRAST TECHNIQUE: Multidetector CT imaging of the chest was performed following the standard protocol without IV contrast. COMPARISON:  None. FINDINGS: Cardiovascular: Atherosclerotic calcification of the aorta, aortic valve and coronary arteries. Ascending aorta measures 4.0 cm. Pulmonic trunk is enlarged. Heart is at the upper limits of normal in size to mildly enlarged. No pericardial effusion. Mediastinum/Nodes: Bulky mediastinal adenopathy measures up to 1.7 cm in the low right  paratracheal station. Suspect hilar adenopathy but measurement is difficult without IV contrast. No axillary or internal mammary adenopathy. Esophagus is grossly unremarkable. Lungs/Pleura: Widespread perilymphatic nodularity. Index lingular nodule measures 1.2 x 1.5 cm. There appear to be areas of traction bronchiectasis/bronchiolectasis and ground-glass bilaterally as well. No pleural fluid. Airway is unremarkable. Upper Abdomen: Visualized portions of the liver and adrenal glands are unremarkable. Low-attenuation lesions in the kidneys measure up to 7.3 cm on the right, incompletely imaged. Spleen is unremarkable. Calcified splenic artery aneurysm is seen proximally, measuring 9 mm. Visualized portions of the pancreas and stomach are grossly unremarkable. Upper abdominal lymph nodes measure up to 8 mm in the gastrohepatic ligament. Musculoskeletal: Degenerative changes in the spine. No worrisome lytic or sclerotic lesions. IMPRESSION: 1. Widespread perilymphatic pulmonary parenchymal and  mediastinal/hilar adenopathy are indicative of metastatic disease. Question breast primary. 2. Suspect underlying interstitial lung disease, poorly characterized given extensive nodularity and lack of high-resolution imaging. 3. Ascending Aortic aneurysm NOS (ICD10-I71.9). Recommend annual imaging followup by CTA or MRA. This recommendation follows 2010 ACCF/AHA/AATS/ACR/ASA/SCA/SCAI/SIR/STS/SVM Guidelines for the Diagnosis and Management of Patients with Thoracic Aortic Disease. Circulation. 2010; 121: I951-O841. Aortic aneurysm NOS (ICD10-I71.9). 4. Calcified splenic artery aneurysm. 5. Aortic atherosclerosis (ICD10-I70.0). Coronary artery calcification. 6. Enlarged pulmonic trunk, indicative of pulmonary arterial hypertension. Electronically Signed   By: Lorin Picket M.D.   On: 11/20/2019 09:59   US Venous Img Lower Unilateral Right (DVT)  Result Date: 11/13/2019 CLINICAL DATA:  Right leg swelling for 1 month. EXAM: RIGHT LOWER EXTREMITY VENOUS DOPPLER ULTRASOUND TECHNIQUE: Gray-scale sonography with compression, as well as color and duplex ultrasound, were performed to evaluate the deep venous system from the level of the common femoral vein through the popliteal and proximal calf veins. COMPARISON:  None. FINDINGS: VENOUS Normal compressibility of the common femoral, superficial femoral, and popliteal veins, as well as the visualized calf veins. Visualized portions of profunda femoral vein and great saphenous vein unremarkable. No filling defects to suggest DVT on grayscale or color Doppler imaging. Doppler waveforms show normal direction of venous flow, normal respiratory plasticity and response to augmentation. Limited views of the contralateral common femoral vein are unremarkable. OTHER There is nonspecific edema in the subcutaneous fat of the right calf. Limitations: none IMPRESSION: No evidence of deep venous thrombosis of the right lower extremity. Nonspecific subcutaneous edema of the right calf.  Electronically Signed   By: Lorriane Shire M.D.   On: 11/13/2019 17:23    ASSESSMENT: Mediastinal lymphadenopathy.  PLAN:    1.  Mediastinal lymphadenopathy:  Patient has a history of stage IIb ER/PR positive, upper inner quadrant right breast.  Previously we did not have access to patient's pathology report, her stage and location of breast were determined based on clinic notes.  She received 6 months of Arimidex between April 2013 and October 2013 prior to undergoing right breast lumpectomy. She reports she underwent adjuvant XRT, but did not receive adjuvant chemotherapy.  Patient completed 5 years of Arimidex in October 2018.  Her most recent mammogram on January 30, 2019 was reported as BI-RADS 1.  This is highly suspicious for recurrent disease, but will need biopsy for confirmation.  CA 27-29 is pending at time of dictation.  Have sent a referral to pulmonology for EBUS as well as a PET scan for staging purposes.  Patient will return to clinic 1 week after biopsy to discuss the results and treatment planning.   I spent a total of 60 minutes reviewing chart data, face-to-face evaluation  with the patient, counseling and coordination of care as detailed above.  Patient expressed understanding and was in agreement with this plan. She also understands that She can call clinic at any time with any questions, concerns, or complaints.    Lloyd Huger, MD   11/28/2019 12:06 PM

## 2019-11-29 ENCOUNTER — Ambulatory Visit: Payer: Medicare PPO | Admitting: Pulmonary Disease

## 2019-11-29 ENCOUNTER — Encounter: Payer: Self-pay | Admitting: Pulmonary Disease

## 2019-11-29 ENCOUNTER — Telehealth: Payer: Self-pay

## 2019-11-29 VITALS — BP 138/84 | HR 64 | Temp 98.0°F | Ht 64.0 in | Wt 126.0 lb

## 2019-11-29 DIAGNOSIS — I340 Nonrheumatic mitral (valve) insufficiency: Secondary | ICD-10-CM

## 2019-11-29 DIAGNOSIS — R918 Other nonspecific abnormal finding of lung field: Secondary | ICD-10-CM | POA: Diagnosis not present

## 2019-11-29 DIAGNOSIS — R0989 Other specified symptoms and signs involving the circulatory and respiratory systems: Secondary | ICD-10-CM | POA: Diagnosis not present

## 2019-11-29 DIAGNOSIS — Z170 Estrogen receptor positive status [ER+]: Secondary | ICD-10-CM

## 2019-11-29 DIAGNOSIS — R590 Localized enlarged lymph nodes: Secondary | ICD-10-CM

## 2019-11-29 DIAGNOSIS — R0602 Shortness of breath: Secondary | ICD-10-CM | POA: Diagnosis not present

## 2019-11-29 DIAGNOSIS — C50211 Malignant neoplasm of upper-inner quadrant of right female breast: Secondary | ICD-10-CM

## 2019-11-29 DIAGNOSIS — I2510 Atherosclerotic heart disease of native coronary artery without angina pectoris: Secondary | ICD-10-CM

## 2019-11-29 LAB — CANCER ANTIGEN 27.29: CA 27.29: 322.7 U/mL — ABNORMAL HIGH (ref 0.0–38.6)

## 2019-11-29 NOTE — Patient Instructions (Addendum)
What we discussed today:   We are going to schedule some studies to look at the arterial flow on your legs particularly on the right  We are going to schedule a heart test to evaluate how strong is the pumping action of your heart  Keep the appointment for your PET scan on 12 July  We are scheduling the procedure for 16 July at 1 PM  See you in follow-up in 3 to 4 weeks time call sooner should any new problems arise

## 2019-11-29 NOTE — Progress Notes (Signed)
Subjective:    Patient ID: Sherri [name], female    DOB: 1933-02-24, 86 y.o.   MRN: [id number]  Reason for consultation: Bilateral lung nodules and mediastinal adenopathy in the setting of prior breast cancer  Requesting physician: Delight Hoh, MD  HPI Sherri [name] is an 86 year old lifelong never smoker with a history of stage IIb ER/PR positive upper inner quadrant right breast cancer status post lumpectomy and radiation therapy in 2013.  The patient received Arimidex 6 months prior to her lumpectomy and adjuvant XRT afterwards.  She did not receive chemotherapy.  Patient completed 5 years of Arimidex in October 2018.  Most recent mammogram was in August 2020 at that time no evidence of recurrence was noted.  Over the last 1 to 2 months as noted increased shortness of breath where she could hardly walk due to the sensation of breathlessness.  She states that sitting down and resting helped.  She has been fairly active and this was a significant change for her.  Around that same time she started noticing some right lower extremity edema.  She underwent evaluation by [facility] for the symptom.  On 10 June showed multiple lung nodules.  This prompted a CT scan of the chest to be obtained.  This was obtained on 16 November 2019 and showed multiple lung nodules as well as mediastinal adenopathy concerning for metastatic disease from breast cancer origin.  CA 27-29 has also been noted to be elevated consistent with metastatic disease as well.  She presents today for evaluation for potential bronchoscopy for diagnosis of these lesions in the mediastinal adenopathy.  Since her initial presenting issue with dyspnea she has improved somewhat regards to dyspnea.  She has not had any orthopnea or paroxysmal nocturnal dyspnea.  No chest pain.  No nausea or vomiting.  No abdominal pain.  She does not describe any anorexia or weight loss.  She states that she was actually "perfectly fine" until 1 to  2 months ago.  She does not describe any hemoptysis or sputum production.  No cough.  No fevers, chills or sweats.  Her only only complaint is that of right lower extremity edema.  She has had venous Dopplers that do not show any DVT.  She voices no other complaint.  Review of Systems A 10 point review of systems was performed and it is as noted above otherwise negative.  Past Medical History:  Diagnosis Date  . Anxiety   . Breast cancer ([place]) 2013   RIGHT lumpectomy  . Bursitis of elbow    RIGHT  . Cancer [facility]) 2013   right breast lumpectomy with a few nodes removed  . Colitis   . Coronary artery disease   . Diverticulitis   . Dysrhythmia    according to anxiety, tachy  . GERD (gastroesophageal reflux disease)    okay since took her gallbladder out  . H/O lumpectomy   . Heart disease   . History of coronary artery stent placement 2007   one stent  . HOH (hard of hearing)   . Hypercholesteremia   . Hypertension   . Myocardial infarction ([place])    2007  . Osteopenia   . Palpitations   . Personal history of radiation therapy   . Skin cancer    Past Surgical History:  Procedure Laterality Date  . BREAST LUMPECTOMY Right 2013   lymph node dissection  . CATARACT EXTRACTION W/PHACO Right 07/23/2015   Procedure: CATARACT EXTRACTION PHACO AND INTRAOCULAR LENS PLACEMENT (IOC);  Surgeon: Birder Robson, MD;  Location: [facility] ORS;  Service: Ophthalmology;  Laterality: Right;  [place] 01:24 AP% 26.7 CDE 22.47 fluid pack lot # [id number]  . CATARACT EXTRACTION W/PHACO Left 08/13/2015   Procedure: CATARACT EXTRACTION PHACO AND INTRAOCULAR LENS PLACEMENT (IOC);  Surgeon: Birder Robson, MD;  Location: [facility] ORS;  Service: Ophthalmology;  Laterality: Left;  [place] 02:40 AP% 30.7 CDE 49.28 fluid pack lot # [id number]  . CHOLECYSTECTOMY    . COLONOSCOPY    . CORONARY ANGIOPLASTY  2007  . EYE SURGERY     bilateral cataract  . HARDWARE REMOVAL Right 04/29/2016   Procedure: HARDWARE REMOVAL;   Surgeon: Hessie Knows, MD;  Location: [facility] ORS;  Service: Orthopedics;  Laterality: Right;  . ORIF ELBOW FRACTURE Right 10/01/2015   Procedure: OPEN REDUCTION INTERNAL FIXATION (ORIF) ELBOW/OLECRANON FRACTURE;  Surgeon: Hessie Knows, MD;  Location: [facility] ORS;  Service: Orthopedics;  Laterality: Right;   Social History   Tobacco Use  . Smoking status: Never Smoker  . Smokeless tobacco: Never Used  Substance Use Topics  . Alcohol use: Yes    Comment: wine occasionally   Allergies  Allergen Reactions  . Prednisone Other (See Comments)    Mood alterations   Current Meds  Medication Sig  . carvedilol (COREG) 6.25 MG tablet Take 6.25 mg by mouth 2 (two) times daily.  . Cholecalciferol (VITAMIN D-3 PO) Take 2,000 mg by mouth at bedtime.   . Coenzyme Q10 (CO Q 10 PO) Take 100 mg by mouth daily.  Arna Medici 25 MCG tablet Take 25 mcg by mouth daily before breakfast.  . lisinopril (PRINIVIL,ZESTRIL) 10 MG tablet Take 5 mg by mouth 2 (two) times daily.  . nitroGLYCERIN (NITROSTAT) 0.4 MG SL tablet Place under the tongue.  . simvastatin (ZOCOR) 40 MG tablet Take 40 mg by mouth at bedtime.   Immunization History  Administered Date(s) Administered  . Influenza,inj,Quad PF,6+ Mos 03/04/2018  . Influenza-Unspecified 04/06/2016, 03/08/2017, 02/23/2019  . PFIZER SARS-COV-2 Vaccination 06/09/2019, 06/30/2019  . Pneumococcal Polysaccharide-23 07/27/2017  . Tdap 01/24/2016       Objective:   Physical Exam BP 138/84 (BP Location: Left Arm, Cuff Size: Normal)   Pulse 64   Temp 98 F (36.7 C) (Temporal)   Ht 5\' 4"  (1.626 m)   Wt 126 lb (57.2 kg)   SpO2 98%   BMI 21.63 kg/m   GENERAL: Thin well-developed female in no acute distress.  She is very spry, alert, fully ambulatory and conversant. HEAD: Normocephalic, atraumatic.  EYES: Pupils equal, round, reactive to light.  No scleral icterus.  MOUTH: Nose/mouth/throat not examined due to masking requirements for COVID 19. NECK: Supple. No  thyromegaly. Trachea midline. No JVD.  No adenopathy. PULMONARY: Lungs clear to auscultation bilaterally.  Good air entry bilaterally. CARDIOVASCULAR: S1 and S2. Regular rate and rhythm.  No overt rub, murmur or gallop heard.  Pulses are diminished on the DP and PT bilaterally. GASTROINTESTINAL: No abdominal distention MUSCULOSKELETAL: No joint deformity, no clubbing, he has mild rubor and edema of the right lower extremity.  NEUROLOGIC: Awake, alert, no focal deficit noted. SKIN: Intact,warm,dry.  Loss of hair distally on the lower extremities. PSYCH: Mood and behavior is appropriate  Representative images from the CT scan of the chest obtained November 16, 2019.  This shows index nodule in the left lower lobe:    Representative slice of the tissue windows of the scan on 16 November 2019 shows mediastinal adenopathy:   Recent Results (from the past 2160 hour(s))  Cancer antigen 27.29     Status: Abnormal   Collection Time: 11/28/19 10:23 AM  Result Value Ref Range   CA 27.29 322.7 (H) 0.0 - 38.6 U/mL    Comment: (NOTE) Siemens Centaur Immunochemiluminometric Methodology (ICMA) Values obtained with different assay methods or kits cannot be used interchangeably. Results cannot be interpreted as absolute evidence of the presence or absence of malignant disease. Performed At: [facility] [place], [place] [id number] Rush Farmer MD JS:[id number]   Comprehensive metabolic panel     Status: Abnormal   Collection Time: 11/28/19 10:23 AM  Result Value Ref Range   Sodium 132 (L) 135 - 145 mmol/L   Potassium 5.3 (H) 3.5 - 5.1 mmol/L   Chloride 98 98 - 111 mmol/L   CO2 26 22 - 32 mmol/L   Glucose, Bld 84 70 - 99 mg/dL    Comment: Glucose reference range applies only to samples taken after fasting for at least 8 hours.   BUN 32 (H) 8 - 23 mg/dL   Creatinine, Ser 1.57 (H) 0.44 - 1.00 mg/dL   Calcium 9.3 8.9 - 10.3 mg/dL   Total Protein 8.2 (H) 6.5 - 8.1 g/dL   Albumin  4.2 3.5 - 5.0 g/dL   AST 21 15 - 41 U/L   ALT 11 0 - 44 U/L   Alkaline Phosphatase 54 38 - 126 U/L   Total Bilirubin 0.6 0.3 - 1.2 mg/dL   GFR calc non Af Amer 30 (L) >60 mL/min   GFR calc Af Amer 35 (L) >60 mL/min   Anion gap 8 5 - 15    Comment: Performed at [facility], [address] [place], [place] [address]  CBC with Differential     Status: Abnormal   Collection Time: 11/28/19 10:23 AM  Result Value Ref Range   WBC 12.4 (H) 4.0 - 10.5 K/uL   RBC 4.20 3.87 - 5.11 MIL/uL   Hemoglobin 12.6 12.0 - 15.0 g/dL   HCT 38.9 36 - 46 %   MCV 92.6 80.0 - 100.0 fL   MCH 30.0 26.0 - 34.0 pg   MCHC 32.4 30.0 - 36.0 g/dL   RDW 14.3 11.5 - 15.5 %   Platelets 235 150 - 400 K/uL   nRBC 0.0 0.0 - 0.2 %   Neutrophils Relative % 59 %   Neutro Abs 7.3 1.7 - 7.7 K/uL   Lymphocytes Relative 26 %   Lymphs Abs 3.2 0.7 - 4.0 K/uL   Monocytes Relative 8 %   Monocytes Absolute 1.0 0 - 1 K/uL   Eosinophils Relative 6 %   Eosinophils Absolute 0.8 (H) 0 - 0 K/uL   Basophils Relative 1 %   Basophils Absolute 0.1 0 - 0 K/uL   Immature Granulocytes 0 %   Abs Immature Granulocytes 0.05 0.00 - 0.07 K/uL    Comment: Performed at [facility], [address], [place], [place] [address]    Assessment & Plan:     ICD-10-CM   1. SOB (shortness of breath)  R06.02 ECHOCARDIOGRAM COMPLETE   Evaluate with 2D echo given history of coronary artery disease  2. Multiple subsolid lung nodules greater than 6 mm in diameter  R91.8    Associated with elevated CA 27.29 likely recurrence from breast cancer Recommend navigational bronchoscopy to biopsy index lung nodule  3. Mediastinal adenopathy  R59.0    Suspect this is also related to recurrent breast cancer We will perform EBUS at the time of  bronchoscopy  4. Decreased pulses in feet  R09.89 US ARTERIAL LOWER EXTREMITY DUPLEX BILATERAL   Markedly decreased pulses in the feet Will evaluate with arterial Dopplers Venous studies have been negative   5. Coronary artery disease involving native coronary artery of native heart without angina pectoris  I25.10    This issue adds complexity to her management Follows with [facility]   6. Mitral valve insufficiency, unspecified etiology  I34.0    This issue adds complexity to her management Follows with [facility] Reschedule 2D echo that patient had canceled  7. Malignant neoplasm of upper-inner quadrant of right breast in female, estrogen receptor positive ([place])  C50.211    Z17.0    This issue adds complexity to her management She likely has recurrence of this issue Initially stage as IIb ER/PR positive   Orders Placed This Encounter  Procedures  . US ARTERIAL LOWER EXTREMITY DUPLEX BILATERAL    Standing Status:   Future    Standing Expiration Date:   11/28/2020    Order Specific Question:   Reason for Exam (SYMPTOM  OR DIAGNOSIS REQUIRED)    Answer:   LEG PAIN- DECREASE PULSES.    Order Specific Question:   Preferred imaging location?    Answer:   [facility]  . ECHOCARDIOGRAM COMPLETE    Standing Status:   Future    Standing Expiration Date:   05/30/2020    Order Specific Question:   Where should this test be performed    Answer:   [facility]    Order Specific Question:   Please indicate who you request to read the echo results.    Answer:   [facility]    Order Specific Question:   Perflutren DEFINITY (image enhancing agent) should be administered unless hypersensitivity or allergy exist    Answer:   Administer Perflutren    Order Specific Question:   Reason for exam-Echo    Answer:   Dyspnea  786.09 / R06.00    Order Specific Question:   Other Comments    Answer:   hx of MI 2007   Discussion:  The procedure of navigational bronchoscopy and endobronchial ultrasound for acquisition of tissue was discussed with the patient.  Son was present and they were both able to ask questions and these were answered to their satisfaction.  Benefits,  limitations and potential complications of the procedure were discussed with the patient/son including, but not limited to bleeding, hemoptysis, respiratory failure requiring intubation and/or prolongued mechanical ventilation, infection, pneumothorax (collapse of lung) requiring chest tube placement, stroke from air embolism or even death.  Patient understands these and agrees to proceed.  She also understands the procedure will be done under general anesthesia.  She will need evaluation of arterial flow of the lower extremities as well as evaluation with echocardiogram prior to the procedure to ensure that she will be able to tolerate general anesthesia without difficulty.  Procedure is tentatively schedule for 16 July at 1 PM.  We will see the patient in follow-up 4 to 6 weeks time after the procedure.  We will stay in contact with oncology with regards to the results.  Thank you for allowing me to participate in the care of this patient.  Total time of this visit in direct and indirect care as well as coordination of care and procedures 65 minutes.  Renold Don, MD [facility]   *This note was dictated using voice recognition software/Dragon.  Despite best efforts to proofread, errors  can occur which can change the meaning.  Any change was purely unintentional.

## 2019-11-29 NOTE — Telephone Encounter (Signed)
Pt has been scheduled for bronch with nav/cellivizio and EBUS on 12/15/2019 1:00. MM:[id number] lung nodule and mediastinal lymphadenopathy. CPT: 30735,43014,[id number]

## 2019-11-30 ENCOUNTER — Other Ambulatory Visit: Payer: Self-pay

## 2019-11-30 ENCOUNTER — Ambulatory Visit
Admission: RE | Admit: 2019-11-30 | Discharge: 2019-11-30 | Disposition: A | Discharge status: Home / Self Care | Payer: Medicare PPO | Source: Ambulatory Visit | Attending: Pulmonary Disease | Admitting: Pulmonary Disease

## 2019-11-30 DIAGNOSIS — R0989 Other specified symptoms and signs involving the circulatory and respiratory systems: Secondary | ICD-10-CM | POA: Insufficient documentation

## 2019-11-30 NOTE — Telephone Encounter (Signed)
Pt is aware of date/time of pre admit and covid test. Nothing further is needed.

## 2019-11-30 NOTE — Telephone Encounter (Signed)
Called and spoke with Jenny Reichmann at [facility]. Procedure codes [phone], [phone], [phone] are codes that are V/B and do not require PA. Call Ref # [id number].  John transferred me to Jolene Schimke for code [phone] due to possible restrictions or reimbursement issues that may occur with this code. Per Jolene Schimke at [facility] is V/B and PA is not required. Call Ref # [id number]. Rhonda J Cobb

## 2019-11-30 NOTE — Telephone Encounter (Signed)
Phone pre admit appt is scheduled for 12/12/19 between 8-1 with covid test 12/13/2019.  Lm to make pt aware.

## 2019-12-11 ENCOUNTER — Ambulatory Visit
Admission: RE | Admit: 2019-12-11 | Discharge: 2019-12-11 | Disposition: A | Discharge status: Home / Self Care | Payer: Medicare PPO | Source: Ambulatory Visit | Attending: Oncology | Admitting: Oncology

## 2019-12-11 ENCOUNTER — Other Ambulatory Visit: Payer: Self-pay

## 2019-12-11 DIAGNOSIS — C7800 Secondary malignant neoplasm of unspecified lung: Secondary | ICD-10-CM | POA: Diagnosis not present

## 2019-12-11 DIAGNOSIS — R1900 Intra-abdominal and pelvic swelling, mass and lump, unspecified site: Secondary | ICD-10-CM | POA: Insufficient documentation

## 2019-12-11 DIAGNOSIS — R590 Localized enlarged lymph nodes: Secondary | ICD-10-CM | POA: Insufficient documentation

## 2019-12-11 LAB — GLUCOSE, CAPILLARY: Glucose-Capillary: 95 mg/dL (ref 70–99)

## 2019-12-11 MED ORDER — FLUDEOXYGLUCOSE F - 18 (FDG) INJECTION
6.5000 | Freq: Once | INTRAVENOUS | Status: AC | PRN
  Administered 2019-12-11: 7 via INTRAVENOUS

## 2019-12-12 ENCOUNTER — Other Ambulatory Visit
Admission: RE | Admit: 2019-12-12 | Discharge: 2019-12-12 | Disposition: A | Discharge status: Home / Self Care | Payer: Medicare PPO | Source: Ambulatory Visit | Attending: Pulmonary Disease | Admitting: Pulmonary Disease

## 2019-12-12 ENCOUNTER — Other Ambulatory Visit: Payer: Self-pay

## 2019-12-12 DIAGNOSIS — Z01812 Encounter for preprocedural laboratory examination: Secondary | ICD-10-CM | POA: Insufficient documentation

## 2019-12-12 DIAGNOSIS — Z20822 Contact with and (suspected) exposure to covid-19: Secondary | ICD-10-CM | POA: Insufficient documentation

## 2019-12-12 HISTORY — DX: Chronic kidney disease, unspecified: N18.9

## 2019-12-12 HISTORY — DX: Hypothyroidism, unspecified: E03.9

## 2019-12-12 HISTORY — DX: Unspecified osteoarthritis, unspecified site: M19.90

## 2019-12-12 NOTE — Pre-Procedure Instructions (Signed)
ECG 12-lead  Component 5 mo ago  Vent Rate (bpm)  61     PR Interval (msec)  162     QRS Interval (msec)  62     QT Interval (msec)  402     QTc (msec)  404     Resulting Agency [facility] RESULTS  Narrative Performed by [facility] This result has an attachment that is not available.  Normal sinus rhythm  Nonspecific T wave abnormalities  Abnormal ECG  When compared with ECG of 12-Jan-2018 10:30,  No significant change was found  I reviewed and concur with this report. Electronically signed [id number] MD, Darnell Level [phone]) on 06/28/2019 5:43:12 PM Specimen Collected: 06/21/19 11:32 AM Last Resulted: 06/21/19 11:32 AM  Received From: [place]  Result Received: 11/10/19 2:11 PM  Encounter Summary

## 2019-12-12 NOTE — Patient Instructions (Addendum)
Your procedure is scheduled on: 12-15-19 FRIDAY Report to Same Day Surgery 2nd floor medical mall [facility] Entrance-take elevator on left to 2nd floor.  Check in with surgery information desk.) To find out your arrival time please call [phone] between 1PM - 3PM on 12-14-19 THURSDAY  Remember: Instructions that are not followed completely may result in serious medical risk, up to and including death, or upon the discretion of your surgeon and anesthesiologist your surgery may need to be rescheduled.    _x___ 1. Do not eat food after midnight the night before your procedure. NO GUM OR CANDY AFTER MIDNIGHT. You may drink clear liquids up to 2 hours before you are scheduled to arrive at the hospital for your procedure.  Do not drink clear liquids within 2 hours of your scheduled arrival to the hospital.  Clear liquids include  --Water or Apple juice without pulp  --Clear carbohydrate beverage such as ClearFast or Gatorade  --Black Coffee or Clear Tea (No milk, no creamers, do not add anything to the coffee or Tea-OK TO ADD SUGAR)     __x__ 2. No Alcohol for 24 hours before or after surgery.   __x__3. No Smoking or e-cigarettes for 24 prior to surgery.  Do not use any chewable tobacco products for at least 6 hour prior to surgery   ____  4. Bring all medications with you on the day of surgery if instructed.    __x__ 5. Notify your doctor if there is any change in your medical condition     (cold, fever, infections).    x___6. On the morning of surgery brush your teeth with toothpaste and water.  You may rinse your mouth with mouth wash if you wish.  Do not swallow any toothpaste or mouthwash.   Do not wear jewelry, make-up, hairpins, clips or nail polish.  Do not wear lotions, powders, or perfumes. You may wear deodorant.  Do not shave 48 hours prior to surgery. Men may shave face and neck.  Do not bring valuables to the hospital.    [facility] is not responsible for any  belongings or valuables.               Contacts, dentures or bridgework may not be worn into surgery.  Leave your suitcase in the car. After surgery it may be brought to your room.  For patients admitted to the hospital, discharge time is determined by your  treatment team.  _  Patients discharged the day of surgery will not be allowed to drive home.  You will need someone to drive you home and stay with you the night of your procedure.    Please read over the following fact sheets that you were given:   [facility] Preparing for Surgery   _x___ TAKE THE FOLLOWING MEDICATION THE MORNING OF SURGERY WITH A SMALL SIP OF WATER. These include:  1. EUTHYROX (LEVOTHYROXINE)  2. COREG (CARVEDILOL)  3.  4.  5.  6.  ____Fleets enema or Magnesium Citrate as directed.   ____ Use CHG Soap or sage wipes as directed on instruction sheet   ____ Use inhalers on the day of surgery and bring to hospital day of surgery  ____ Stop Metformin and Janumet 2 days prior to surgery.    ____ Take 1/2 of usual insulin dose the night before surgery and none on the morning surgery.   _x___ Follow recommendations from Cardiologist, Pulmonologist or PCP regarding stopping Aspirin, Coumadin, Plavix ,Eliquis, Effient, or  Pradaxa, and Pletal-ASK DR Melvern Sample AT [place] (12-12-19 @ 1 PM) WHEN YOU NEED TO STOP YOUR ASPIRIN  X____Stop Anti-inflammatories such as Advil, Aleve, Ibuprofen, Motrin, Naproxen, Naprosyn, Goodies powders or aspirin products NOW-OK to take Tylenol    _x___ Stop supplements until after surgery-STOP YOUR CO Q-10 NOW-YOU MAY RESUME AFTER YOUR SURGERY   ____ Bring C-Pap to the hospital.

## 2019-12-12 NOTE — Pre-Procedure Instructions (Signed)
Progress Notes - documented in this encounter Sherri Beavers, NP - 11/13/2019 2:45 PM EDT Formatting of this note is different from the original. Established Patient Visit   Chief Complaint: Chief Complaint  Patient presents with  . Coronary Artery Disease  . Congestive Heart Failure  new onset per Dr Sherri [name]  Date of Service: 11/13/2019 Date of Birth: 13-Mar-1933 PCP: Sherri Lister, MD  History of Present Illness: Ms. Sherri [name] is a 86 y.o.female patient with PMH significant for essential hypertension, CAD (s/p NSTEMI with PCI and D1 stent 2007), moderate mitral insufficiency, bilateral carotid artery stenosis (less than 50% bilaterally 2020), pure hypercholesterolemia who presents today via referral from her primary care provider Dr. Baldemar Lenis MD.  Today the patient presents to our office with complaints of SOB and exhaustion. She explains that her shortness of breath began around 2 months ago. She was initially able to walk 3 miles per day and was exercising regularly but this fatigue and exhaustion has severely limited her and her ability to exercise . She becomes short of breath walking even short distances. When this began 2 months ago she was initially having some pain in her back that at times would radiate from her back into her right groin. He is still dealing with this pain in her lower back and she describes her back is feeling weak. She was also having some frequent urination, 5 to 6 times per night; however this level of urinary frequency has been going on for a while now. She eventually presented to her primary care provider who initially thought that this could be a kidney issue. See note below for more details.  The patient denies chest pain/atypical angina at rest or with exertion. The patient endorses shortness of breath with activity but denies shortness of breath at rest or with lying down. The patient does endorse some right leg swelling affecting the right extremity  only. The extremity has been swollen for a couple months now with notable edema beginning at the right calf and extending in the right foot as well.   The patient denies palpitations. The patient denies lightheadedness/dizziness. The patient denies syncope/near syncope. The patient denies any bleeding issues. Prior to this episode of shortness of breath on exertion the patient was obtaining intentional exercise daily walking 3 miles a day for years; but this shortness of breath has derailed her workout routine. From a dietary standpoint she adheres to a fairly simple diet that is generally fairly healthy; toast and jam for breakfast, or occasionally a single waffle- sandwich or peanut butter on graham crackers for lunch, and a sensible dinner. She takes all her medications as prescribed. She also takes her blood pressure measurement every day and it is consistently 130/60-70. Blood pressure in office today is 130/74 with a pulse of 60.  Per Sherri [name] note on 10/09/2019: "Stage 3 chronic kidney disease, unspecified whether stage 3a or 3b CKD- She has been experiencing fatigue, weakness, back pain, and urinary frequency over the past week or so. She has also been experiencing bilateral low back pain. She has known CKD 3, with stable kidney function when checked a few weeks ago. Her UA in office today showed specific gravity 1.010, trace blood, small leukocyte esterase, 4-10 WBC, and rare bacteria. Will treat empirically for a UTI with cipro and send a urine culture. Given her additional symptoms, will obtain CBC, TSH, and CMP to evaluate for further issues. Her hemoglobin in office was stable at 12, which remains essentially unchanged  from December 2020. Encouraged her to drink plenty of water throughout the day as well. Return precautions given."  Of note 1 month later she presented to Sherri [name] office in follow up still having persistent fatigue and weakness as well as bilateral lower back pain and  right groin pain that radiates around to her right side. At this time Sherri [name] that it would be prudent to refer her to cardiology over concerns for potential CHF.  Per recent visit with Sherri [name] office on 11/09/2019: "CHF: I think her DOE and crackles on exam are a new onset of CHF. I would like to check a BNP and start her on low dose lasix as a next step. I will ask Sherri [name] to see her to consider doing an Echo."   Sherri [name] also obtained a chest X-ray on 11/09/2019 which revealed the following:  "Patchy densities scattered throughout both lungs. Some of these densities have a nodular configuration. Heart size appears to be within normal limits. Artherosclerotic calcifications at the aortic arch. No large pleural effusions. Mild kyphosis in the thoracic spine. No large pleural effusions.  Impression: Coarse and nodular opacities throughout both lungs. Pulmonary nodules and neoplasm cannot be excluded. Recommend further characterization with chest CT."   Sherri [name] has ordered a CT chest without contrast for further evaluation. Sherri [name] also plans to recheck labs this week after starting her on Lasix during the visit on 11/09/2019. Moreover, he started her on very low-dose levothyroxine at the 11/09/2019 visit  Recent BNP on 11/09/2019: 94.8  Recent CMP on 11/09/2019 revealed a sodium of 137, potassium 4.8, BUN 24, creatinine 1.3, GFR 39, AST 15, ALT 7.  Lipid panel on 05/22/2019 revealed a total cholesterol of 137, triglycerides 113, HDL 48.4, LDL 66.  ------------------------------------------------------------------------------ Echocardiogram from 03/31/2016: Powell, [place] [id number] [facility] Acct #: [id number] [address] Sherri [name], [place] [address] Date: 03/31/2016 01:19 PM Adult Female Age: 14 yrs ECHOCARDIOGRAM REPORT Outpatient KC::KCWC STUDY:CHEST WALL TAPE:0000:00: 0:00:00 MD1: KOWALSKI, BRUCE  [name] ECHO:Yes DOPPLER:Yes FILE:0000-000-000 BP: 110/70 mmHg COLOR:Yes CONTRAST:No MACHINE:[place] Height: 62 in RV BIOPSY:No 3D:No SOUND QLTY:Moderate Weight: 136 lb MEDIUM:None BSA: 1.6 m2  ___________________________________________________________________________________________ HISTORY:MI REASON:Assess, LV function INDICATION:I21.4 Non-ST elevation (NSTEMI) myocardial infarction ___________________________________________________________________________________________  ECHOCARDIOGRAPHIC MEASUREMENTS 2D DIMENSIONS AORTA Values Normal Range MAIN PA Values Normal Range Annulus: nm* [2.1 - 2.5] PA Main: nm* [1.5 - 2.1] Aorta Sin: 3.7 cm [2.7 - 3.3] RIGHT VENTRICLE ST Junction: nm* [2.3 - 2.9] RV Base: 3.6 cm [ < 4.2] Asc.Aorta: nm* [2.3 - 3.1] RV Mid: nm* [ < 3.5] LEFT VENTRICLE RV Length: nm* [ < 8.6] LVIDd: 5.0 cm [3.9 - 5.3] INFERIOR VENA CAVA LVIDs: 3.6 cm Max. IVC: nm* [ <= 2.1] FS: 28.5 % [> 25] Min. IVC: nm* SWT: 0.83 cm [0.5 - 0.9] ------------------ PWT: 0.73 cm [0.5 - 0.9] nm* - not measured LEFT ATRIUM LA Diam: 4.4 cm [2.7 - 3.8] LA A4C Area: nm* [ < 20] LA Volume: nm* [22 - 52] ___________________________________________________________________________________________  ECHOCARDIOGRAPHIC DESCRIPTIONS  AORTIC ROOT Size:Normal Dissection:INDETERM FOR DISSECTION  AORTIC VALVE Leaflets:Tricuspid Morphology:Normal Mobility:Fully mobile  LEFT VENTRICLE Size:Normal Anterior:Normal Contraction:Normal Lateral:Normal Closest EF:>55% (Estimated) Septal:Normal LV Masses:No Masses Apical:Normal TWS:FKCL Inferior:Normal Posterior:Normal Dias.FxClass:(Grade 1) relaxation abnormal, E/A reversal  MITRAL VALVE Leaflets:Normal Mobility:Fully mobile Morphology:Normal  LEFT ATRIUM Size:MILDLY ENLARGED LA Masses:No masses IA Septum:Normal IAS  MAIN PA Size:Normal  PULMONIC VALVE Morphology:Normal Mobility:Fully mobile  RIGHT VENTRICLE RV Masses:No Masses  Size:Normal Free Wall:Normal  Contraction:Normal  TRICUSPID VALVE Leaflets:Normal Mobility:Fully mobile Morphology:Normal  RIGHT ATRIUM Size:Normal RA Other:None RA Mass:No masses  PERICARDIUM Fluid:No effusion  INFERIOR VENACAVA Size:Normal Normal respiratory collapse  ____________________________________________________________________  DOPPLER ECHO and OTHER SPECIAL PROCEDURES Aortic:No AR No AS 109.0 cm/sec peak vel 4.8 mmHg peak grad 2.0 mmHg mean grad 2.7 cm^2 by DOPPLER  Mitral:MODERATE MR No MS 3.5 cm^2 by DOPPLER MV Inflow E Vel=93.3 cm/sec MV Annulus E'Vel=5.6 cm/sec E/E'Ratio=16.7  Tricuspid:MODERATE TR No TS 328.0 cm/sec peak TR vel 48.0 mmHg peak RV pressure  Pulmonary:TRIVIAL PR No PS  ___________________________________________________________________________________________  INTERPRETATION NORMAL LEFT VENTRICULAR SYSTOLIC FUNCTION WITH AN ESTIMATED EF = 55 % NORMAL RIGHT VENTRICULAR SYSTOLIC FUNCTION MODERATE TRICUSPID AND MITRAL VALVE INSUFFICIENCY NO VALVULAR STENOSIS MILD LA ENLARGEMENT  ---------------------------------------------------------------------------------------------------------- Exercise stress test from 03/31/2016: The patient was stressed according to the BRUCE protocol for 09:03 min:s, achieving a work level of Max. METS: 10.1. The resting heart rate of 60 bpm rose to a maximal heart rate of 139 bpm. This value represents 99 % of the maximal, age-predicted heart rate. The resting blood pressure of 130/70 mmHg , rose to a maximum blood pressure of 168/74 mmHg. The exercise test was stopped due to Fatigue. Summary: Resting ECG: Normal. Functional Capacity: Normal. HR Response to Exercise: Normal Overall HR Response To Exercise. Chest Pain: No Chest Pain. Arrhythmias: No Arrhythmias. ST Changes: Depression downsloping. Conclusions Abnormal treadmill ECG with evidence of myocardial ischemia.   Past Medical and Surgical History   Past Medical History Past Medical History:  Diagnosis Date  . Anxiety disorder  . Breast cancer (CMS-HCC)  Breast-right  . Breast cancer, right breast (CMS-HCC) 01/24/2016  . CAD (coronary artery disease)  . Chicken pox  . Colitis, acute, unspecified  . Diverticulitis  . Dysrhythmia  . GERD (gastroesophageal reflux disease)  . Heart disease  . HOH (hard of hearing)  . Hypercholesteremia  . Hypertension  . Myocardial infarction (CMS-HCC)  . Non-ST elevation myocardial infarction (NSTEMI), subendocardial infarction, subsequent episode of care (CMS-HCC) 03/09/2016  . Palpitations   Past Surgical History She has a past surgical history that includes [place]; Cholecystectomy; BREAST LUMPECTOMY; CATARACT EXTRACTION W/ PHACO AND INTRAOCULARLENS PLACEMENT (Bilateral, 07/23/2015 / 08/13/2015); Coronary angioplasty; ORIF RIGHT ELBOW (Right, 10/01/2015); and R Elbow Hardware Removal 04/29/16.   Medications and Allergies  Current Medications  Current Outpatient Medications  Medication Sig Dispense Refill  . aspirin 81 MG EC tablet Take 81 mg by mouth once daily.  . CA COMB NO.1/VIT D3/B6/FA/B12 (VITAMIN D3, CALCIUM CIT-PHOS, ORAL) Take 1,800 mg by mouth as needed.   . carvediloL (COREG) 3.125 MG tablet TAKE 1 TABLET TWICE DAILY WITH MEALS 180 tablet 1  . cholecalciferol (VITAMIN D3) 1,000 unit tablet Take 1,000 Units by mouth once daily  . COQ10, UBIQUINOL, ORAL Take 100 mg by mouth once daily.  . FUROsemide (LASIX) 20 MG tablet Take 1 tablet (20 mg total) by mouth once daily 30 tablet 1  . levothyroxine (SYNTHROID) 25 MCG tablet Take 1 tablet (25 mcg total) by mouth once daily Take on an empty stomach with a glass of water at least 30-60 minutes before breakfast. 30 tablet 1  . lisinopriL (ZESTRIL) 10 MG tablet Take 1 tablet (10 mg total) by mouth 2 (two) times daily 180 tablet 1  . nitroGLYcerin (NITROSTAT) 0.4 MG SL tablet Place 1 tablet (0.4 mg total) under the  tongue every 5 (five) minutes as needed for Chest pain 90 tablet 0  . simvastatin (ZOCOR) 40  MG tablet Take 1 tablet (40 mg total) by mouth nightly 90 tablet 3   No current facility-administered medications for this visit.   Allergies: Prednisone  Social and Family History  Social History reports that she has never smoked. She has never used smokeless tobacco. She reports current alcohol use of about 1.0 standard drinks of alcohol per week. She reports that she does not use drugs.  Family History Family History  Problem Relation Age of Onset  . High blood pressure (Hypertension) Mother  . Heart disease Mother  . Myocardial Infarction (Heart attack) Mother  . Stroke Mother  . High blood pressure (Hypertension) Father  . Colon cancer Father  . High blood pressure (Hypertension) Sister  . Heart disease Sister  . Breast cancer Sister  . Myocardial Infarction (Heart attack) Brother  . Colon cancer Sister  . Diabetes type II Sister  . Myocardial Infarction (Heart attack) Brother  . Myocardial Infarction (Heart attack) Brother   Review of Systems   Review of Systems: The patient denies chest pain, orthopnea, paroxysmal nocturnal dyspnea, palpitations, heart racing, presyncope, syncope. Review of 12 Systems is negative except as described above.  Physical Examination   Vitals:BP 130/74  Pulse 68  Ht 160 cm (5\' 3" )  Wt 58.1 kg (128 lb)  SpO2 91%  BMI 22.67 kg/m  Ht:160 cm (5\' 3" ) Wt:58.1 kg (128 lb) VOZ:DGUY surface area is 1.61 meters squared. Body mass index is 22.67 kg/m.  General: Alert and oriented. Well-appearing. No acute distress. HEENT: Pupils equally reactive to light and accomodation  Neck: Supple, carotid pulses 2+. No JVD Lungs: Normal effort of breathing; crackles heard in bases bilaterally, coarse sounding breath sounds in upper lobes bilaterally; no wheezes. Chest expansion symmetrical Heart: Regular rate and rhythm. No murmur, rub, or gallop Abdomen: soft  nontender, nondistended, with normal bowel sounds Extremities: +1 pitting edema over right calf and shin extending to right foot Peripheral Pulses: 2+ radial bilaterally Skin: Warm, dry, no diaphoresis, color appropriate for ethnicity  Assessment   86 y.o. female with  1. Essential hypertension, benign  2. Coronary artery disease involving native coronary artery of native heart without angina pectoris  3. Pure hypercholesterolemia  4. DOE (dyspnea on exertion)  5. SOB (shortness of breath)  6. Right leg swelling   Plan   1. Shortness of breath/dyspnea on exertion. Given her past medical history of coronary artery disease in addition to moderate mitral insufficiency there is some concern that these symptoms could be related to congestive heart failure. The patient does exhibit bibasilar crackles and extreme fatigue/exhaustion along with shortness of breath activity. It is prudent to assess her heart from a structural functional standpoint via echocardiogram in order to see if this could be related to his heart structure/ function. Is also prudent to obtain a ischemic evaluation via stress test to see if her symptoms have an ischemic component that is giving rise to some cardiomyopathy as well. -Obtain echocardiogram -Obtain exercise stress test.  2. Right leg swelling. Her right leg is edematous at the cath and at the shin and extends down to the ankle and foot. This unilateral leg swelling raises concern for DVT or possibly lymphedema. Given the catastrophic consequences of DVT and her lack of evaluation for this condition thus far, it seems prudent to obtain an venous ultrasound of her right lower extremity in order to rule out possible DVT. -obtain ultrasound lower extremity right venous  Results obtained from the venous ultrasound of the right leg on  11/13/2019 revealing the following: IMPRESSION:  No evidence of deep venous thrombosis of the right lower extremity.  Nonspecific  subcutaneous edema of the right calf.   3. Coronary artery disease. Patient denies chest pain at rest or with exertion today's visit. however she does have some dyspnea on exertion and exhaustion that could be indicative of advancing coronary artery disease. It is prudent to obtain a stress test to evaluate for any possible ischemia that could be contributing to her symptoms. She is currently taking aspirin 81 mg and simvastatin as well as lisinopril and carvedilol secondary prevention. I recommend that she pursue a diet low in saturated fat and cholesterol for cardiovascular risk reduction. We will continue current medication regimen.  4. Benign essential hypertension. Patient's blood pressure is well controlled in office today at 130/74. This is consistent with her blood pressure values that she obtains at home most every day which runs 130/60-70. Recent CMP on 11/09/2019 revealed a sodium of 137, potassium 4.8, BUN 24, creatinine 1.3, GFR 39, AST 15, ALT 7. I recommended low-salt diet to the patient. She is taking lisinopril in addition to carvedilol for antihypertensive medications. Continue current medication regimen.  5. Pure hypercholesterolemia. Most recent lipid panel on 05/22/2019 revealed a total cholesterol of 137, triglycerides of 113, HDL of 48, LDL of 66. Lipids well controlled on simvastatin 40 mg oral nightly. Continue current statin regimen.  Return in about 2 months (around 01/13/2020).  I personally performed the service, non-incident to. (WP)   NATHAN Raymon Mutton, NP   Sherri Beavers, NP   This dictation was prepared with dragon dictation. Any transcription errors that result from this process are unintentional.    Electronically signed by Sherri Beavers, NP at 11/13/2019 6:49 PM EDT  Plan of Treatment - documented as of this encounter Upcoming Encounters Upcoming Encounters  Date Type Specialty Care Team Description  12/12/2019 Ancillary Procedure Cardiology  Flossie Dibble, [place]  [facility]  [place], [place] [address]  [phone]  [phone] (Fax)    12/14/2019 Ancillary Orders Lab Sherri Lister, MD  [address] [facility]  [place], [place] [address]  [phone]  [phone] (Fax)    12/21/2019 Office Visit Internal Medicine Sherri [name], Jacqulyn Liner, MD  [id number] [facility] Medicine  [place], [place] [address]  [phone]  [phone] (Fax)     Scheduled Orders Scheduled Orders  Name Type Priority Associated Diagnoses Order Schedule  Echo complete Echocardiography Routine DOE (dyspnea on exertion)  SOB (shortness of breath)  1 Occurrences starting 11/13/2019 until 11/13/2020  CARD stress test only, exercise Cardiac Services Routine Coronary artery disease involving native coronary artery of native heart without angina pectoris  DOE (dyspnea on exertion)  SOB (shortness of breath)  1 Occurrences starting 11/13/2019 until 11/13/2020  [place] lower extremity venous right Imaging 1-Same Day Clinic/Patient Waiting Right leg swelling  Ordered: 11/13/2019  Goals - documented as of this encounter Goal Patient Goal Type Associated Problems Recent Progress Patient-Stated? Author  Weight (lb) < 61.2 kg (135 lb)  Weight  128 lb (58.1 kg) (11/13/2019 2:49 PM EDT) Yes Babaoff, Jacqulyn Liner, MD  Note:   Formatting of this note might be different from the original. Eat healthy   Visit Diagnoses - documented in this encounter Diagnosis  Essential hypertension, benign - Primary   Coronary artery disease involving native coronary artery of native  heart without angina pectoris   Pure hypercholesterolemia   DOE (dyspnea on exertion)  Other dyspnea and respiratory abnormality   SOB (shortness of breath)  Shortness of breath   Right leg swelling   Images Patient  Demographics  Patient Address Communication Language Race / Ethnicity Marital Status  [address] [place], [place] [address] [phone] (Home) [phone] (Mobile) [email or web address] English (Preferred) White / Not Hispanic or Latino Divorced  Patient Contacts  Contact Name Contact Address Communication Relationship to Patient  Jola Baptist Unknown [phone] [facility]) [phone] (Mobile) Brother or Sister, Emergency Contact  Document Information  Primary Care Provider Other Service Providers Document Coverage Dates  Sherri Lister, MD (Oct. 27, 2017October 27, 2017 - Present) [phone] (Work) [phone] (Fax) [phone] [facility] Internal Medicine [place], [place] [address] Family Medicine [facility] [address] [place], [place] [address]  Jun. 14, 2021June 14, [place] [address] [place], [place] [address]   Encounter Providers Encounter Date  Sherri Beavers, NP (Attending) [phone] (Work) [phone] (Fax) [address] [place],  [address] Cardiovascular Disease Jun. 14, 2021June 14, 2021 - Jun. 22, 2021June 22, 2021    Show All Sections

## 2019-12-13 ENCOUNTER — Telehealth: Payer: Self-pay | Admitting: Pulmonary Disease

## 2019-12-13 ENCOUNTER — Encounter (HOSPITAL_COMMUNITY): Payer: Self-pay | Admitting: Urgent Care

## 2019-12-13 ENCOUNTER — Other Ambulatory Visit
Admission: RE | Admit: 2019-12-13 | Discharge: 2019-12-13 | Disposition: A | Discharge status: Home / Self Care | Payer: Medicare PPO | Source: Ambulatory Visit | Attending: Pulmonary Disease | Admitting: Pulmonary Disease

## 2019-12-13 ENCOUNTER — Other Ambulatory Visit: Payer: Self-pay | Admitting: *Deleted

## 2019-12-13 DIAGNOSIS — Z01812 Encounter for preprocedural laboratory examination: Secondary | ICD-10-CM | POA: Diagnosis not present

## 2019-12-13 DIAGNOSIS — N135 Crossing vessel and stricture of ureter without hydronephrosis: Secondary | ICD-10-CM

## 2019-12-13 LAB — SARS CORONAVIRUS 2 (TAT 6-24 HRS): SARS Coronavirus 2: NEGATIVE

## 2019-12-13 LAB — POTASSIUM: Potassium: 4.4 mmol/L (ref 3.5–5.1)

## 2019-12-13 NOTE — Telephone Encounter (Signed)
Per Dr. Patsey Berthold verbally pt can continue aspirin 81mg . Pt does not need to stop piror to bronch on Friday. Pt is aware and voiced her understanding.  Nothing further is needed.

## 2019-12-13 NOTE — Progress Notes (Signed)
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 Pt states she feels slight pain in her right lower back. Does not report symptoms of vaginal bleeding at this time. Pt instructed to keep appts as scheduled and to expect call from urology office with her appt. Pt verbalized understanding. Nothing further needed at this time.

## 2019-12-14 ENCOUNTER — Encounter: Payer: Self-pay | Admitting: Oncology

## 2019-12-14 ENCOUNTER — Ambulatory Visit: Payer: Medicare PPO | Admitting: Urology

## 2019-12-14 ENCOUNTER — Encounter: Payer: Self-pay | Admitting: Urology

## 2019-12-14 ENCOUNTER — Other Ambulatory Visit: Payer: Self-pay

## 2019-12-14 VITALS — BP 135/84 | HR 80 | Ht 64.0 in | Wt 126.0 lb

## 2019-12-14 DIAGNOSIS — N1330 Unspecified hydronephrosis: Secondary | ICD-10-CM | POA: Diagnosis not present

## 2019-12-14 DIAGNOSIS — R1900 Intra-abdominal and pelvic swelling, mass and lump, unspecified site: Secondary | ICD-10-CM | POA: Diagnosis not present

## 2019-12-14 NOTE — Progress Notes (Signed)
12/14/19 11:28 AM   Sherri [name] 1932/08/16 [id number]  CC: Right hydroureteronephrosis, right pelvic mass  HPI: I saw Sherri [name] in urology clinic today for the above issues.  She is an 86 year old female with a history of breast cancer in 2013 who was recently found to have multiple pulmonary nodules suspicious for metastatic disease.  This prompted a PET/CT that was performed on 12/11/2019 and showed diffuse pulmonary metastatic disease with mediastinal and hilar lymphadenopathy, as well as a large right pelvis lesion worrisome for ovarian or uterine primary, as well as extensive retroperitoneal lymphadenopathy.  There is severe right hydroureteronephrosis upstream to the mass with almost complete right renal atrophy and minimal remaining parenchyma.  She reports that over the last 6 months she has had some intermittent right-sided back pain, however this is only when she is physically active or walking, and completely resolves by sitting down.  She denies any renal colic.  She denies any gross hematuria, urinary symptoms, or history of UTIs.  She has 10 pounds of weight loss over the last 6 to 12 months, but she feels she has changed her diet to lose this weight.  Most recent renal function on 11/28/2019 shows a creatinine of 1.57, EGFR 30, from creatinine of 1.3, EGFR of 39 in October 2020.   PMH: Past Medical History:  Diagnosis Date  . Anxiety   . Arthritis   . Breast cancer ([place]) 2013   RIGHT lumpectomy  . Bursitis of elbow    RIGHT  . Cancer [facility]) 2013   right breast lumpectomy with a few nodes removed  . Chronic kidney disease    stage 3  . Colitis   . Coronary artery disease   . Diverticulitis   . Dysrhythmia    according to anxiety, tachy  . GERD (gastroesophageal reflux disease)    okay since took her gallbladder out  . H/O lumpectomy   . Heart disease   . History of coronary artery stent placement 2007   one stent  . HOH (hard of hearing)   .  Hypercholesteremia   . Hypertension   . Hypothyroidism   . Myocardial infarction ([place])    2007  . Osteopenia   . Palpitations   . Personal history of radiation therapy   . Skin cancer     Surgical History: Past Surgical History:  Procedure Laterality Date  . BREAST LUMPECTOMY Right 2013   lymph node dissection  . CATARACT EXTRACTION W/PHACO Right 07/23/2015   Procedure: CATARACT EXTRACTION PHACO AND INTRAOCULAR LENS PLACEMENT (IOC);  Surgeon: Birder Robson, MD;  Location: [facility] ORS;  Service: Ophthalmology;  Laterality: Right;  [place] 01:24 AP% 26.7 CDE 22.47 fluid pack lot # [id number]  . CATARACT EXTRACTION W/PHACO Left 08/13/2015   Procedure: CATARACT EXTRACTION PHACO AND INTRAOCULAR LENS PLACEMENT (IOC);  Surgeon: Birder Robson, MD;  Location: [facility] ORS;  Service: Ophthalmology;  Laterality: Left;  [place] 02:40 AP% 30.7 CDE 49.28 fluid pack lot # [id number]  . CHOLECYSTECTOMY    . COLONOSCOPY    . CORONARY ANGIOPLASTY  2007  . EYE SURGERY     bilateral cataract  . HARDWARE REMOVAL Right 04/29/2016   Procedure: HARDWARE REMOVAL;  Surgeon: Hessie Knows, MD;  Location: [facility] ORS;  Service: Orthopedics;  Laterality: Right;  . ORIF ELBOW FRACTURE Right 10/01/2015   Procedure: OPEN REDUCTION INTERNAL FIXATION (ORIF) ELBOW/OLECRANON FRACTURE;  Surgeon: Hessie Knows, MD;  Location: [facility] ORS;  Service: Orthopedics;  Laterality: Right;    Family History: Family History  Problem Relation Age of Onset  . Breast cancer Sister 49    Social History:  reports that she quit smoking about 64 years ago. Her smoking use included cigarettes. She has a 0.50 pack-year smoking history. She has never used smokeless tobacco. She reports current alcohol use. She reports that she does not use drugs.  Physical Exam: BP 135/84   Pulse 80   Ht '5\' 4"'  (1.626 m)   Wt 126 lb (57.2 kg)   BMI 21.63 kg/m    Constitutional:  Alert and oriented, No acute distress. Cardiovascular: No clubbing, cyanosis, or  edema. Respiratory: Normal respiratory effort, no increased work of breathing. GI: Abdomen is soft, nontender, nondistended GU: No CVA tenderness  Laboratory Data: Reviewed, see HPI  Pertinent Imaging: I have personally reviewed the PET/CT, see HPI for full details.  No prior imaging to review duration of findings.  Assessment & Plan:   In summary, she is an 86 year old female with a history of breast cancer in 2013 who presented with shortness of breath and on chest CT was found to have multiple nodules worrisome for metastatic disease.  This prompted a PET CT which showed a large right pelvic mass worrisome for uterine or ovarian primary, with severe right hydroureteronephrosis and completely atrophic right kidney with minimal remaining parenchyma.  She has occasional right-sided back pain but this is with physical activity and completely resolves with sitting, and I do not think her right-sided hydronephrosis is the etiology of her right-sided pain.  Additionally, her renal function is stable over the last 6 months with EGFR of 30 from 39 in the fall.  With her right kidney being completely atrophic with almost no remaining parenchyma, I do not see a role for ureteral stent placement or nephrostomy tube placement, as I do not think she will have any recovery of renal function, or improvement in her right-sided pain.  I drew a number of pictures explaining these findings today.  We also discussed the next steps would be per Dr. [name] in oncology, and likely would be a biopsy of either the lung or pelvic masses, which would determine pathology and future treatment strategies.  Keep follow-up with oncology, anticipate biopsy of pelvic mass vs pulmonary mets No role for right ureteral stent placement or nephrostomy tube  I spent 65 total minutes on the day of the encounter including pre-visit review of the medical record, face-to-face time with the patient, and post visit ordering of  labs/imaging/tests.  Nickolas Madrid, MD 12/14/2019  [facility] [address] [place], [place] [address] [phone]

## 2019-12-14 NOTE — Progress Notes (Signed)
[facility] Perioperative Services  Pre-Admission/Anesthesia Testing Clinical Review  Date: 12/14/19  Patient Demographics:  Name: Sherri [name] DOB:   1933-03-01 MRN:   [id number]  Planned Surgical Procedure(s):    Case: [id number] Date/Time: 12/15/19 1245   Procedures:      VIDEO BRONCHOSCOPY WITH ENDOBRONCHIAL NAVIGATION WITH CELLZIZIO (N/A )     VIDEO BRONCHOSCOPY WITH ENDOBRONCHIAL ULTRASOUND (N/A )   Anesthesia type: General   Pre-op diagnosis: BILATERAL LUNG NODULE   Location: [facility] PROCEDURE RM 02 / [facility]S FOR ANESTHESIA GROUP   Surgeons: Tyler Pita, MD     NOTE: Available PAT nursing documentation and vital signs have been reviewed. Clinical nursing staff has updated patient's PMH/PSHx, current medication list, and drug allergies/intolerances to ensure comprehensive history available to assist in medical decision making as it pertains to the aforementioned surgical procedure and anticipated anesthetic course.   Clinical Discussion:  Sherri [name] is a 86 y.o. female who is submitted for pre-surgical anesthesia review and clearance prior to her undergoing the above procedure. Patient is a Former Smoker (0.5 pack years; quit 11/1955). Pertinent PMH includes: CAD, STEMI (2007; s/p PCI with stent x 1), dysrhythmia, BILATERAL carotid stenosis, MV insufficiency, HTN, HLD, palpitations, CKD-III, GERD (no Tx required s/p cholecystectomy), hypothyroidism, breast cancer (s/p RIGHT lumpectomy), mediastinal LAD, anxiety.  Patient is followed by cardiology Nehemiah Massed, MD). She was last seen in the cardiology clinic on 11/13/2019; notes reviewed. At that time, patient complained of SOB and exhaustion. She denied chest pain at rest and with exertional. Functional capacity had markedly decreased since onset of SOB x 2 months ago; was walking 3 miles/day and exercising, but now with significant DOE. No orthopnea, vertiginous symptoms, or syncope/presyncope.  Recent CXR revealed coarse and nodular opacities throughout both lungs; neoplasm could not be excluded; CT was recommended. Cardiac testing from 2017 was reviewed; echocardiogram demonstrated an LVEF of 55% and EST was abnormal with evidence of myocardial ischemia (see below for full results). Decision was made to repeat both the echo and stress test. Repeat echo done on 12/12/2019 remained unchanged from previous; LVEF 55% (see below for full results). In speaking with cardiology, it was determined that patient cancelled her repeat stress test citing a recurrence of her cancer.   Patient with PMH (+) for stage IIb ER/PR (+) breast cancer. She is under the care of Dr. [name]; last visit was on 11/28/2019; notes reviewed. Patient underwent multi-modal treatment for her cancer (lumpectomy, 5 years adjuvant hormonal therapy, and XRT). She was released from care by the cancer center in 06/2018. Patient referred back to the cancer center by PCP with complaints of increasing SOB x 1-2 months. CT imaging of the chest reveals widespread mediastinal LAD suggestive of recurrence of her breast cancer (see full results below). Follow up PET imaging performed revealing hypermetabolic LAD in the chest and pelvis (see full results below). Patient was referred to pulmonology for ENB/EBUS required for diagnosis and staging purposes.   She denies previous intra-operative complications with anesthesia. This patient is on daily antiplatelet therapy. She has been instructed on recommendations from pulmonology to continue her daily low dose ASA therapy throughotu her procedural course.   Vitals with BMI 12/14/2019 11/29/2019 11/27/2019  Height 5\' 4"  5\' 4"  5\' 3"   Weight 126 lbs 126 lbs 126 lbs 8 oz  BMI 21.62 71.24 58.09  Systolic 983 382 505  Diastolic 84 84 75  Pulse 80 64 66    Providers/Specialists:   PROVIDER ROLE  LAST Sherrian Divers, MD Pulmonology (Surgeon) 11/29/2019  [name], MD Primary Care  Provider 11/09/2019  Serafina Royals, MD Cardiology 11/13/2019  Delight Hoh, MD Oncology 11/28/2019   Allergies:  Prednisone  Current Home Medications:   No current facility-administered medications for this encounter.   Marland Kitchen acetaminophen (TYLENOL) 500 MG tablet  . aspirin EC 81 MG tablet  . carvedilol (COREG) 3.125 MG tablet  . Cholecalciferol (VITAMIN D-3 PO)  . Coenzyme Q10 (CO Q 10 PO)  . EUTHYROX 25 MCG tablet  . lisinopril (PRINIVIL,ZESTRIL) 10 MG tablet  . nitroGLYCERIN (NITROSTAT) 0.4 MG SL tablet  . simvastatin (ZOCOR) 40 MG tablet   History:   Past Medical History:  Diagnosis Date  . Anxiety   . Arthritis   . Breast cancer ([place]) 2013   RIGHT lumpectomy  . Bursitis of elbow    RIGHT  . Cancer [facility]) 2013   right breast lumpectomy with a few nodes removed  . Chronic kidney disease    stage 3  . Colitis   . Coronary artery disease   . Diverticulitis   . Dysrhythmia    according to anxiety, tachy  . GERD (gastroesophageal reflux disease)    okay since took her gallbladder out  . H/O lumpectomy   . Heart disease   . History of coronary artery stent placement 2007   one stent  . HOH (hard of hearing)   . Hypercholesteremia   . Hypertension   . Hypothyroidism   . Myocardial infarction ([place])    2007  . Osteopenia   . Palpitations   . Personal history of radiation therapy   . Skin cancer    Past Surgical History:  Procedure Laterality Date  . BREAST LUMPECTOMY Right 2013   lymph node dissection  . CATARACT EXTRACTION W/PHACO Right 07/23/2015   Procedure: CATARACT EXTRACTION PHACO AND INTRAOCULAR LENS PLACEMENT (IOC);  Surgeon: Birder Robson, MD;  Location: [facility] ORS;  Service: Ophthalmology;  Laterality: Right;  [place] 01:24 AP% 26.7 CDE 22.47 fluid pack lot # [id number]  . CATARACT EXTRACTION W/PHACO Left 08/13/2015   Procedure: CATARACT EXTRACTION PHACO AND INTRAOCULAR LENS PLACEMENT (IOC);  Surgeon: Birder Robson, MD;  Location: [facility] ORS;   Service: Ophthalmology;  Laterality: Left;  [place] 02:40 AP% 30.7 CDE 49.28 fluid pack lot # [id number]  . CHOLECYSTECTOMY    . COLONOSCOPY    . CORONARY ANGIOPLASTY  2007  . EYE SURGERY     bilateral cataract  . HARDWARE REMOVAL Right 04/29/2016   Procedure: HARDWARE REMOVAL;  Surgeon: Hessie Knows, MD;  Location: [facility] ORS;  Service: Orthopedics;  Laterality: Right;  . ORIF ELBOW FRACTURE Right 10/01/2015   Procedure: OPEN REDUCTION INTERNAL FIXATION (ORIF) ELBOW/OLECRANON FRACTURE;  Surgeon: Hessie Knows, MD;  Location: [facility] ORS;  Service: Orthopedics;  Laterality: Right;   Family History  Problem Relation Age of Onset  . Breast cancer Sister 19   Social History   Tobacco Use  . Smoking status: Former Smoker    Packs/day: 0.25    Years: 2.00    Pack years: 0.50    Types: Cigarettes    Quit date: 12/12/1955    Years since quitting: 64.0  . Smokeless tobacco: Never Used  Vaping Use  . Vaping Use: Never used  Substance Use Topics  . Alcohol use: Yes    Comment: wine occasionally  . Drug use: No    Pertinent Clinical Results:  LABS: Labs reviewed: Acceptable for surgery.  Hospital Outpatient Visit on 12/13/2019  Component Date Value Ref Range Status  . SARS Coronavirus 2 12/13/2019 NEGATIVE  NEGATIVE Final   Comment: (NOTE) SARS-CoV-2 target nucleic acids are NOT DETECTED.  The SARS-CoV-2 RNA is generally detectable in upper and lower respiratory specimens during the acute phase of infection. Negative results do not preclude SARS-CoV-2 infection, do not rule out co-infections with other pathogens, and should not be used as the sole basis for treatment or other patient management decisions. Negative results must be combined with clinical observations, patient history, and epidemiological information. The expected result is Negative.  Fact Sheet for Patients: SugarRoll.be  Fact Sheet for Healthcare  Providers: https://www.woods-mathews.com/  This test is not yet approved or cleared by the [place] FDA and  has been authorized for detection and/or diagnosis of SARS-CoV-2 by FDA under an Emergency Use Authorization (EUA). This EUA will remain  in effect (meaning this test can be used) for the duration of the COVID-19 declaration under Se                          ction 564(b)(1) of the Act, 21 U.S.C. section 360bbb-3(b)(1), unless the authorization is terminated or revoked sooner.  Performed at [facility], [place] [address], [place], [place] [address]   . Potassium 12/13/2019 4.4  3.5 - 5.1 mmol/L Final   Performed at [facility], [address] [place], [place] [address]  Hospital Outpatient Visit on 12/11/2019  Component Date Value Ref Range Status  . Glucose-Capillary 12/11/2019 95  70 - 99 mg/dL Final   Glucose reference range applies only to samples taken after fasting for at least 8 hours.  Appointment on 11/28/2019  Component Date Value Ref Range Status  . CA 27.29 11/28/2019 322.7* 0.0 - 38.6 U/mL Final   Comment: (NOTE) Siemens Centaur Immunochemiluminometric Methodology [facility]) Values obtained with different assay methods or kits cannot be used interchangeably. Results cannot be interpreted as absolute evidence of the presence or absence of malignant disease. Performed At: [facility] [place], [place] [id number] Rush Farmer MD WL:[id number]   . Sodium 11/28/2019 132* 135 - 145 mmol/L Final  . Potassium 11/28/2019 5.3* 3.5 - 5.1 mmol/L Final  . Chloride 11/28/2019 98  98 - 111 mmol/L Final  . CO2 11/28/2019 26  22 - 32 mmol/L Final  . Glucose, Bld 11/28/2019 84  70 - 99 mg/dL Final   Glucose reference range applies only to samples taken after fasting for at least 8 hours.  . BUN 11/28/2019 32* 8 - 23 mg/dL Final  . Creatinine, Ser 11/28/2019 1.57* 0.44 - 1.00 mg/dL Final  . Calcium 11/28/2019 9.3  8.9 -  10.3 mg/dL Final  . Total Protein 11/28/2019 8.2* 6.5 - 8.1 g/dL Final  . Albumin 11/28/2019 4.2  3.5 - 5.0 g/dL Final  . AST 11/28/2019 21  15 - 41 U/L Final  . ALT 11/28/2019 11  0 - 44 U/L Final  . Alkaline Phosphatase 11/28/2019 54  38 - 126 U/L Final  . Total Bilirubin 11/28/2019 0.6  0.3 - 1.2 mg/dL Final  . GFR calc non Af Amer 11/28/2019 30* >60 mL/min Final  . GFR calc Af Amer 11/28/2019 35* >60 mL/min Final  . Anion gap 11/28/2019 8  5 - 15 Final   Performed at [facility], [address] [place], [place] [address]  . WBC 11/28/2019 12.4* 4.0 - 10.5 K/uL Final  . RBC 11/28/2019 4.20  3.87 - 5.11  MIL/uL Final  . Hemoglobin 11/28/2019 12.6  12.0 - 15.0 g/dL Final  . HCT 11/28/2019 38.9  36 - 46 % Final  . MCV 11/28/2019 92.6  80.0 - 100.0 fL Final  . MCH 11/28/2019 30.0  26.0 - 34.0 pg Final  . MCHC 11/28/2019 32.4  30.0 - 36.0 g/dL Final  . RDW 11/28/2019 14.3  11.5 - 15.5 % Final  . Platelets 11/28/2019 235  150 - 400 K/uL Final  . nRBC 11/28/2019 0.0  0.0 - 0.2 % Final  . Neutrophils Relative % 11/28/2019 59  % Final  . Neutro Abs 11/28/2019 7.3  1.7 - 7.7 K/uL Final  . Lymphocytes Relative 11/28/2019 26  % Final  . Lymphs Abs 11/28/2019 3.2  0.7 - 4.0 K/uL Final  . Monocytes Relative 11/28/2019 8  % Final  . Monocytes Absolute 11/28/2019 1.0  0 - 1 K/uL Final  . Eosinophils Relative 11/28/2019 6  % Final  . Eosinophils Absolute 11/28/2019 0.8* 0 - 0 K/uL Final  . Basophils Relative 11/28/2019 1  % Final  . Basophils Absolute 11/28/2019 0.1  0 - 0 K/uL Final  . Immature Granulocytes 11/28/2019 0  % Final  . Abs Immature Granulocytes 11/28/2019 0.05  0.00 - 0.07 K/uL Final   Performed at [facility], [address] [place], [place] [address]    EKG: Date: 06/21/2019 Rate: 61 bpm Rhythm: normal sinus Axis (leads I and aVF): Normal Intervals: PR 162 ms. QTc 404 ms. ST segment and T wave changes: Non-specific T wave abnormalities. Comparison:  Similar to previous tracing obtained on 01/12/2018. NOTE: Performed at [facility]; tracing not available for review; above based off of cardiologist interpretation.   IMAGING / PROCEDURES: ECHOCARDIOGRAM done on 12/12/2019 1. Normal LV systolic function with an estimated EF = 55%. 2. Normal RV systolic function 3. Moderate tricuspid and mitral valve insufficiency 4. No valvular stenosis  PET SCAN done on 12/11/2019 1. Diffuse pulmonary metastatic disease and extensive mediastinal and hilar lymphadenopathy.  2. The primary lesion is most likely in the right pelvis and could be ovarian or uterine in origin.  3. Associated extensive retroperitoneal lymphadenopathy. 4. The right ureter is obstructed by the right pelvic mass with chronic right-sided hydroureteronephrosis and marked parenchymal thinning of the right kidney. 5. No findings suspicious for osseous metastatic disease.  CT CHEST WITHOUT CONTRAST done on 11/16/2019 1. Widespread perilymphatic pulmonary parenchymal and mediastinal/hilar adenopathy are indicative of metastatic disease. Question breast primary. 2. Suspect underlying interstitial lung disease, poorly characterized given extensive nodularity and lack of high-resolution imaging. 3. Ascending Aortic aneurysm  4. Calcified splenic artery aneurysm. 5. Aortic atherosclerosis.  6. Coronary artery calcification. 7. Enlarged pulmonic trunk, indicative of pulmonary arterial hypertension.  EXERCISE STRESS TEST done on 03/31/2016 1. Resting ECG: Normal. 2. Functional Capacity: Normal (10.1 METS) 3. HR Response to Exercise: Normal Overall HR Response To Exercise. 4. No CP or arrhythmias 5. ST Changes: Depression down sloping 6. Abnormal treadmill ECG with evidence of myocardial ischemia.  ECHOCARDIOGRAM done on 03/31/2016 1. Normal LV systolic function with an estimated EF = 55%. 2. Normal RV systolic function 3. Moderate tricuspid and mitral valve  insufficiency. 4. No valvular stenosis. 5. Mild LA enlargement   Impression and Plan:  Sherri [name] has been referred for pre-anesthesia review and clearance prior her undergoing the planned anesthetic and procedural courses. Available labs, pertinent testing, and imaging results were personally reviewed by me. Patient has been discussed with anesthesiology, pulmonology, and cardiology.  Copy of cardiology recommendations for further testing placed on chart for inclusion in patient's EMR. All specialty service lines are in agreement that procedure should be postponed pending the patient having the cardiac stress test. Spoke with Tamala Julian, [facility] (pulmonology) who advises that she will make the patient aware that procedure will be postponed. I have also spoken with [place], RN (oncology navigator) regarding the rescheduling of the procedure; asked to make Dr. [name] aware of the delay. SDS notified of cancellation.   Honor Loh, MSN, APRN, FNP-C, CEN [facility]  Peri-operative Services Nurse Practitioner Phone: [phone] 12/14/19 4:20 PM  NOTE: This note has been prepared using Dragon dictation software. Despite my best ability to proofread, there is always the potential that unintentional transcriptional errors may still occur from this process.

## 2019-12-14 NOTE — Patient Instructions (Signed)
Based on your PET scan it looks like you have a large tumor in the right pelvis that may be cancer from the ovary or uterus, and this has spread to the lungs most likely.  There is blockage of the right kidney, but most of the kidney has shrunken away, and I do not think that putting a stent in the right kidney or in the back to the kidney will help your kidney function or any of your right-sided discomfort.  Keep follow-up with oncology and Dr. [name] for likely biopsy and next steps in treatment.

## 2019-12-15 ENCOUNTER — Encounter: Admission: RE | Payer: Self-pay | Source: Home / Self Care

## 2019-12-15 ENCOUNTER — Ambulatory Visit: Admission: RE | Admit: 2019-12-15 | Payer: Medicare PPO | Source: Home / Self Care | Admitting: Pulmonary Disease

## 2019-12-15 SURGERY — VIDEO BRONCHOSCOPY WITH ENDOBRONCHIAL NAVIGATION
Anesthesia: General

## 2019-12-20 ENCOUNTER — Telehealth: Payer: Self-pay | Admitting: *Deleted

## 2019-12-20 NOTE — Telephone Encounter (Signed)
Niece Rosemarie Ax who is on patient ROI called stating that patient is confused about referral to Urology and is asking for a return call to discuss what is going on and what was seen on PET scan. I attempted to return call but got her voice mail. I left message to call me back

## 2019-12-20 NOTE — Telephone Encounter (Signed)
Rosemarie Ax called back and we went over patient PET results and what that means, She states that Urology said since her cancer is sprad to other organs that they are not able to do anything for her. She thanked me for calling her back

## 2019-12-22 ENCOUNTER — Telehealth: Payer: Self-pay | Admitting: Pulmonary Disease

## 2019-12-22 NOTE — Telephone Encounter (Signed)
Let us try for Monday, August 2 at 7:30 in the morning.  It is navigation and EBUS.  As previously noted for the case canceled on 16 July.

## 2019-12-22 NOTE — Telephone Encounter (Signed)
Will schedule on Monday 12/25/2019, as scheduling department is currently closed.

## 2019-12-22 NOTE — Telephone Encounter (Signed)
Pt states that she would like to r/s her biopsy. Pt states that she had a stress test done and Dr. Nehemiah Massed has cleared her for surgery. Cb#: [phone]

## 2019-12-22 NOTE — Telephone Encounter (Signed)
Spoke to pt, who stated that she was cleared by cardiology today and she would like to reschedule bronch.   Dr. Patsey Berthold please advise. Thanks

## 2019-12-25 NOTE — Progress Notes (Signed)
  [facility] Perioperative Services: Pre-Admission/Anesthesia Testing     Date: 12/25/19  Name: Sherri [name] MRN:   [id number]  Re: Follow up on cancelled ENB/EBUS  Patient originally scheduled for procedure on 12/15/2019, however it was ultimately cancelled pending cardiac testing and clearance. Since that time, patient has undergone a TTE and stress test. Post-testing office notes from cardiology reviewed. Functional capacity per DASI documented as being > 4 METS. Clearance for surgery issued on 12/22/2019 by Dr. Nehemiah Massed who indicated that the overall risk of cardiac complication with surgery an/or procedure is low (< 1%). Per input from primary cardiologist, patient may proceed to surgery and/or invasive procedure without restriction to pre or post operative and/or procedural care. Dr. Nehemiah Massed notes that:   The patient is at lowest risk possible for cardiovascular complications with surgical intervention and/or invasive procedure.  Currently has no evidence active and/or significant angina and/or congestive heart failure.   The patient may discontinue aspirin 4 days prior to procedure and restart at a safe period thereafter.  Patient has met with cardiology at this point to discuss her results. She has contacted PCCM office to determine when she would be able to be rescheduled for her procedure with Dr. Patsey Berthold. Per direction of performing surgeon, patient has been rescheduled for her ENB/EBUS on 01/01/2020.  Plan:  [name] has been referred for pre-anesthesia review and clearance prior her undergoing the planned anesthetic and procedural courses. Available labs, pertinent testing, and imaging results were personally reviewed by me. This patient has been appropriately cleared by cardiology.   Clinical review performed on 12/14/2019 by me. Additionally, I have reviewed the studies and notes from recent cardiology office visit, and have concluded  that, barring and significant acute changes in the patient's overall condition, it is anticipated that she will be able to proceed with the planned surgical intervention. Any acute changes in clinical condition may necessitate her procedure being postponed and/or cancelled. Pre-surgical instructions were reviewed with the patient during her PAT appointment and questions were fielded by PAT clinical staff.  Honor Loh, MSN, APRN, FNP-C, CEN [facility]  Peri-operative Services Nurse Practitioner Phone: [phone] 12/25/19 11:12 AM  NOTE: This note has been prepared using Dragon dictation software. Despite my best ability to proofread, there is always the potential that unintentional transcriptional errors may still occur from this process.

## 2019-12-25 NOTE — Telephone Encounter (Signed)
EBUS with navigation has been scheduled for 01/01/2020 at 1:00. Will contact pt once pre admit and covid test has been scheduled.

## 2019-12-25 NOTE — Progress Notes (Deleted)
[place]  [phone] [phone]  ID: Sherri [name] OB: 11/19/1932  MR#: [id number]  [id number]#:[id number]  Patient Care Team: [name], MD as PCP - General (Family Medicine) Lloyd Huger, MD as Consulting Physician (Oncology)  CHIEF COMPLAINT: Mediastinal lymphadenopathy.  INTERVAL HISTORY: Patient is an 86 year old female who was last evaluated in clinic in January 2019.  She has noticed increasing shortness of breath over the past 1 to 2 months and subsequent imaging revealed bulky mediastinal lymphadenopathy concerning for recurrent breast cancer.  She otherwise feels well.  She has no neurologic complaints.  She denies any recent fevers or illnesses.  She has a good appetite and denies weight loss.  She has no chest pain, cough, or hemoptysis.  She denies any nausea, vomiting, constipation, or diarrhea.  She has no urinary complaints.  Patient otherwise feels well and offers no further specific complaints.    REVIEW OF SYSTEMS:   Review of Systems  Constitutional: Negative.  Negative for diaphoresis, fever, malaise/fatigue and weight loss.  Respiratory: Positive for shortness of breath. Negative for cough.   Cardiovascular: Negative.  Negative for chest pain and leg swelling.  Gastrointestinal: Negative.  Negative for abdominal pain.  Genitourinary: Negative.  Negative for dysuria.  Musculoskeletal: Negative.  Negative for joint pain.  Skin: Negative.  Negative for rash.  Neurological: Negative.  Negative for dizziness, sensory change, weakness and headaches.  Psychiatric/Behavioral: Negative.  The patient is not nervous/anxious.     As per HPI. Otherwise, a complete review of systems is negative.  PAST MEDICAL HISTORY: Past Medical History:  Diagnosis Date  . Anxiety   . Arthritis   . Breast cancer ([place]) 2013   RIGHT lumpectomy  . Bursitis of elbow    RIGHT  . Cancer [facility]) 2013   right breast lumpectomy with a few  nodes removed  . Chronic kidney disease    stage 3  . Colitis   . Coronary artery disease   . Diverticulitis   . Dysrhythmia    according to anxiety, tachy  . GERD (gastroesophageal reflux disease)    okay since took her gallbladder out  . H/O lumpectomy   . Heart disease   . History of coronary artery stent placement 2007   one stent  . HOH (hard of hearing)   . Hypercholesteremia   . Hypertension   . Hypothyroidism   . Myocardial infarction ([place])    2007  . Osteopenia   . Palpitations   . Personal history of radiation therapy   . Skin cancer     PAST SURGICAL HISTORY: Past Surgical History:  Procedure Laterality Date  . BREAST LUMPECTOMY Right 2013   lymph node dissection  . CATARACT EXTRACTION W/PHACO Right 07/23/2015   Procedure: CATARACT EXTRACTION PHACO AND INTRAOCULAR LENS PLACEMENT (IOC);  Surgeon: Birder Robson, MD;  Location: [facility] ORS;  Service: Ophthalmology;  Laterality: Right;  [place] 01:24 AP% 26.7 CDE 22.47 fluid pack lot # [id number]  . CATARACT EXTRACTION W/PHACO Left 08/13/2015   Procedure: CATARACT EXTRACTION PHACO AND INTRAOCULAR LENS PLACEMENT (IOC);  Surgeon: Birder Robson, MD;  Location: [facility] ORS;  Service: Ophthalmology;  Laterality: Left;  [place] 02:40 AP% 30.7 CDE 49.28 fluid pack lot # [id number]  . CHOLECYSTECTOMY    . COLONOSCOPY    . CORONARY ANGIOPLASTY  2007  . EYE SURGERY     bilateral cataract  . HARDWARE REMOVAL Right 04/29/2016   Procedure: HARDWARE REMOVAL;  Surgeon: Hessie Knows, MD;  Location: [facility] ORS;  Service: Orthopedics;  Laterality: Right;  . ORIF ELBOW FRACTURE Right 10/01/2015   Procedure: OPEN REDUCTION INTERNAL FIXATION (ORIF) ELBOW/OLECRANON FRACTURE;  Surgeon: Hessie Knows, MD;  Location: [facility] ORS;  Service: Orthopedics;  Laterality: Right;    FAMILY HISTORY: Reviewed and unchanged. No reported history of malignancy or chronic disease.     ADVANCED DIRECTIVES:    HEALTH MAINTENANCE: Social History   Tobacco Use  .  Smoking status: Former Smoker    Packs/day: 0.25    Years: 2.00    Pack years: 0.50    Types: Cigarettes    Quit date: 12/12/1955    Years since quitting: 64.0  . Smokeless tobacco: Never Used  Vaping Use  . Vaping Use: Never used  Substance Use Topics  . Alcohol use: Yes    Comment: wine occasionally  . Drug use: No     Colonoscopy:  PAP:  Bone density:  Lipid panel:  Allergies  Allergen Reactions  . Prednisone Other (See Comments)    Mood alterations    Current Outpatient Medications  Medication Sig Dispense Refill  . acetaminophen (TYLENOL) 500 MG tablet Take 500 mg by mouth every 8 (eight) hours as needed for moderate pain.    Marland Kitchen aspirin EC 81 MG tablet Take 81 mg by mouth at bedtime. Swallow whole.    . carvedilol (COREG) 3.125 MG tablet Take 3.125 mg by mouth 2 (two) times daily.     . Cholecalciferol (VITAMIN D-3 PO) Take 2,000 mg by mouth at bedtime.     . Coenzyme Q10 (CO Q 10 PO) Take 100 mg by mouth daily.    Arna Medici 25 MCG tablet Take 25 mcg by mouth daily before breakfast.    . lisinopril (PRINIVIL,ZESTRIL) 10 MG tablet Take 10 mg by mouth in the morning and at bedtime.     . nitroGLYCERIN (NITROSTAT) 0.4 MG SL tablet Place 0.4 mg under the tongue every 5 (five) minutes as needed for chest pain.     . simvastatin (ZOCOR) 40 MG tablet Take 40 mg by mouth at bedtime.     No current facility-administered medications for this visit.    OBJECTIVE: There were no vitals filed for this visit.   There is no height or weight on file to calculate BMI.    ECOG FS:0 - Asymptomatic  General: Well-developed, well-nourished, no acute distress. Eyes: Pink conjunctiva, anicteric sclera. HEENT: Normocephalic, moist mucous membranes. Lungs: No audible wheezing or coughing. Heart: Regular rate and rhythm. Abdomen: Soft, nontender, no obvious distention. Musculoskeletal: No edema, cyanosis, or clubbing. Neuro: Alert, answering all questions appropriately. Cranial nerves  grossly intact. Skin: No rashes or petechiae noted. Psych: Normal affect.   LAB RESULTS:  Lab Results  Component Value Date   NA 132 (L) 11/28/2019   K 4.4 12/13/2019   CL 98 11/28/2019   CO2 26 11/28/2019   GLUCOSE 84 11/28/2019   BUN 32 (H) 11/28/2019   CREATININE 1.57 (H) 11/28/2019   CALCIUM 9.3 11/28/2019   PROT 8.2 (H) 11/28/2019   ALBUMIN 4.2 11/28/2019   AST 21 11/28/2019   ALT 11 11/28/2019   ALKPHOS 54 11/28/2019   BILITOT 0.6 11/28/2019   GFRNONAA 30 (L) 11/28/2019   GFRAA 35 (L) 11/28/2019    Lab Results  Component Value Date   WBC 12.4 (H) 11/28/2019   NEUTROABS 7.3 11/28/2019   HGB 12.6 11/28/2019   HCT 38.9 11/28/2019   MCV 92.6 11/28/2019   PLT 235 11/28/2019     STUDIES:  NM PET Image Initial (PI) Skull Base To Thigh  Result Date: 12/11/2019 CLINICAL DATA:  Initial treatment strategy for pulmonary metastatic disease. EXAM: NUCLEAR MEDICINE PET SKULL BASE TO THIGH TECHNIQUE: 7.0 mCi F-18 FDG was injected intravenously. Full-ring PET imaging was performed from the skull base to thigh after the radiotracer. CT data was obtained and used for attenuation correction and anatomic localization. Fasting blood glucose: 95 mg/dl COMPARISON:  Chest CT 11/16/2019 FINDINGS: Mediastinal blood pool activity: SUV max 2.13 Liver activity: SUV max NA NECK: No hypermetabolic lymph nodes in the neck. Incidental CT findings: Carotid artery calcifications. CHEST: Diffuse pulmonary metastatic disease is demonstrated with innumerable hypermetabolic pulmonary nodules. Right upper lobe nodule measuring 12 mm on image 56/3 has an SUV max of 7.58. 16 mm left upper lobe nodule on image 87/3 has an SUV max of 7.58. Bulky hypermetabolic mediastinal and hilar lymphadenopathy. 18 mm right paratracheal nodal lesion has an SUV max of 9.35. Subcarinal node measures 13 mm and has an SUV max of 9.01. 8.7 mm left subclavicular lymph node is hypermetabolic with SUV max of 9.62. No axillary adenopathy  or hypermetabolic breast lesions. Incidental CT findings: Advanced vascular calcifications. Fusiform aneurysmal dilatation of the ascending thoracic aorta is stable. ABDOMEN/PELVIS: There is a large irregular right adnexal mass measuring approximately 6 x 5 cm. This is hypermetabolic with SUV max of 95.28. Looks like it may involve the uterus and the right ovary. It is obstructing the right ureter and causing severe chronic right-sided hydroureteronephrosis with very little residual renal parenchyma. There is associated fairly extensive common iliac and retroperitoneal lymphadenopathy. Nodal lesion near the duodenum and large duodenal diverticulum measures approximately 2.5 cm and the SUV max is 7.55. 10 mm left para-aortic lymph node has an SUV max of 7.97. There is patchy ill-defined nodularity in the omentum which demonstrates mild hypermetabolism and highly suspicious for omental disease. No findings for adrenal gland or hepatic metastatic disease. No inguinal adenopathy. Incidental CT findings: Advanced vascular calcifications. Large duodenal diverticulum. Chronic right-sided hydroureteronephrosis. SKELETON: No findings suspicious for osseous metastatic disease. Incidental CT findings: none IMPRESSION: 1. Diffuse pulmonary metastatic disease and extensive mediastinal and hilar lymphadenopathy. The primary lesion is most likely in the right pelvis and could be ovarian or uterine in origin. Associated extensive retroperitoneal lymphadenopathy. 2. The right ureter is obstructed by the right pelvic mass with chronic right-sided hydroureteronephrosis and marked parenchymal thinning of the right kidney. 3. No findings suspicious for osseous metastatic disease. Electronically Signed   By: Marijo Sanes M.D.   On: 12/11/2019 13:02   US ARTERIAL LOWER EXTREMITY DUPLEX BILATERAL  Result Date: 11/30/2019 CLINICAL DATA:  Diminished pedal pulses EXAM: BILATERAL LOWER EXTREMITY ARTERIAL DUPLEX SCAN TECHNIQUE: Gray-scale  sonography as well as color Doppler and duplex ultrasound was performed to evaluate the arteries of both lower extremities including the common, superficial and profunda femoral arteries, popliteal artery and calf arteries. COMPARISON:  None. FINDINGS: Right Lower Extremity Inflow: Biphasic common femoral artery waveforms without velocity abnormality. No inflow disease. Outflow: Biphasic profunda femoral, superficial femoral, and popliteal arterial waveforms without significant velocity abnormality. Scattered plaque throughout. No occlusive process. Runoff: Similar biphasic posterior and anterior tibial arterial waveforms without velocity abnormality. Atherosclerotic changes noted. Left Lower Extremity Inflow: Biphasic common femoral arterial waveform without velocity abnormality. No inflow disease. Outflow: Biphasic profunda femoral, superficial femoral, popliteal arterial waveforms without velocity abnormality. No occlusive process. Runoff: Biphasic posterior and anterior tibial arterial waveforms without velocity abnormality. Atherosclerotic changes noted. IMPRESSION: Nonocclusive mild peripheral  atherosclerosis bilaterally with biphasic waveforms throughout both lower extremities without significant velocity abnormality. Electronically Signed   By: Jerilynn Mages.  Shick M.D.   On: 11/30/2019 16:15    ASSESSMENT: Mediastinal lymphadenopathy.  PLAN:    1.  Mediastinal lymphadenopathy:  Patient has a history of stage IIb ER/PR positive, upper inner quadrant right breast.  Previously we did not have access to patient's pathology report, her stage and location of breast were determined based on clinic notes.  She received 6 months of Arimidex between April 2013 and October 2013 prior to undergoing right breast lumpectomy. She reports she underwent adjuvant XRT, but did not receive adjuvant chemotherapy.  Patient completed 5 years of Arimidex in October 2018.  Her most recent mammogram on January 30, 2019 was reported as  BI-RADS 1.  This is highly suspicious for recurrent disease, but will need biopsy for confirmation.  CA 27-29 is pending at time of dictation.  Have sent a referral to pulmonology for EBUS as well as a PET scan for staging purposes.  Patient will return to clinic 1 week after biopsy to discuss the results and treatment planning.   I spent a total of 60 minutes reviewing chart data, face-to-face evaluation with the patient, counseling and coordination of care as detailed above.  Patient expressed understanding and was in agreement with this plan. She also understands that She can call clinic at any time with any questions, concerns, or complaints.    Lloyd Huger, MD   12/25/2019 11:06 PM

## 2019-12-27 NOTE — Telephone Encounter (Addendum)
covid test 12/28/2019 [facility]  Pt is aware of dates/times and voiced her understanding.  Nothing further is needed.

## 2019-12-28 ENCOUNTER — Other Ambulatory Visit: Payer: Self-pay

## 2019-12-28 ENCOUNTER — Other Ambulatory Visit
Admission: RE | Admit: 2019-12-28 | Discharge: 2019-12-28 | Disposition: A | Discharge status: Home / Self Care | Payer: Medicare PPO | Source: Ambulatory Visit | Attending: Pulmonary Disease | Admitting: Pulmonary Disease

## 2019-12-28 ENCOUNTER — Inpatient Hospital Stay: Payer: Medicare PPO | Admitting: Oncology

## 2019-12-28 DIAGNOSIS — Z01812 Encounter for preprocedural laboratory examination: Secondary | ICD-10-CM | POA: Insufficient documentation

## 2019-12-28 DIAGNOSIS — Z20822 Contact with and (suspected) exposure to covid-19: Secondary | ICD-10-CM | POA: Insufficient documentation

## 2019-12-28 LAB — SARS CORONAVIRUS 2 (TAT 6-24 HRS): SARS Coronavirus 2: NEGATIVE

## 2020-01-01 ENCOUNTER — Ambulatory Visit
Admission: RE | Disposition: A | Discharge status: Home / Self Care | Payer: Self-pay | Source: Home / Self Care | Attending: Pulmonary Disease

## 2020-01-01 ENCOUNTER — Ambulatory Visit: Payer: Medicare PPO

## 2020-01-01 ENCOUNTER — Ambulatory Visit
Admission: RE | Admit: 2020-01-01 | Discharge: 2020-01-01 | Disposition: A | Discharge status: Home / Self Care | Payer: Medicare PPO | Attending: Pulmonary Disease | Admitting: Pulmonary Disease

## 2020-01-01 ENCOUNTER — Ambulatory Visit: Payer: Medicare PPO | Admitting: Urgent Care

## 2020-01-01 DIAGNOSIS — Z803 Family history of malignant neoplasm of breast: Secondary | ICD-10-CM | POA: Insufficient documentation

## 2020-01-01 DIAGNOSIS — Z853 Personal history of malignant neoplasm of breast: Secondary | ICD-10-CM | POA: Diagnosis not present

## 2020-01-01 DIAGNOSIS — C7802 Secondary malignant neoplasm of left lung: Secondary | ICD-10-CM | POA: Insufficient documentation

## 2020-01-01 DIAGNOSIS — Z888 Allergy status to other drugs, medicaments and biological substances status: Secondary | ICD-10-CM | POA: Diagnosis not present

## 2020-01-01 DIAGNOSIS — Z85828 Personal history of other malignant neoplasm of skin: Secondary | ICD-10-CM | POA: Diagnosis not present

## 2020-01-01 DIAGNOSIS — I252 Old myocardial infarction: Secondary | ICD-10-CM | POA: Insufficient documentation

## 2020-01-01 DIAGNOSIS — Z923 Personal history of irradiation: Secondary | ICD-10-CM | POA: Insufficient documentation

## 2020-01-01 DIAGNOSIS — Z955 Presence of coronary angioplasty implant and graft: Secondary | ICD-10-CM | POA: Diagnosis not present

## 2020-01-01 DIAGNOSIS — I129 Hypertensive chronic kidney disease with stage 1 through stage 4 chronic kidney disease, or unspecified chronic kidney disease: Secondary | ICD-10-CM | POA: Insufficient documentation

## 2020-01-01 DIAGNOSIS — Z7982 Long term (current) use of aspirin: Secondary | ICD-10-CM | POA: Insufficient documentation

## 2020-01-01 DIAGNOSIS — E7800 Pure hypercholesterolemia, unspecified: Secondary | ICD-10-CM | POA: Diagnosis not present

## 2020-01-01 DIAGNOSIS — R918 Other nonspecific abnormal finding of lung field: Secondary | ICD-10-CM | POA: Diagnosis present

## 2020-01-01 DIAGNOSIS — Z79899 Other long term (current) drug therapy: Secondary | ICD-10-CM | POA: Insufficient documentation

## 2020-01-01 DIAGNOSIS — Z87891 Personal history of nicotine dependence: Secondary | ICD-10-CM | POA: Insufficient documentation

## 2020-01-01 DIAGNOSIS — Z79890 Hormone replacement therapy (postmenopausal): Secondary | ICD-10-CM | POA: Insufficient documentation

## 2020-01-01 DIAGNOSIS — E039 Hypothyroidism, unspecified: Secondary | ICD-10-CM | POA: Insufficient documentation

## 2020-01-01 DIAGNOSIS — C50211 Malignant neoplasm of upper-inner quadrant of right female breast: Secondary | ICD-10-CM

## 2020-01-01 DIAGNOSIS — R590 Localized enlarged lymph nodes: Secondary | ICD-10-CM | POA: Diagnosis not present

## 2020-01-01 DIAGNOSIS — I2510 Atherosclerotic heart disease of native coronary artery without angina pectoris: Secondary | ICD-10-CM | POA: Diagnosis not present

## 2020-01-01 DIAGNOSIS — N183 Chronic kidney disease, stage 3 unspecified: Secondary | ICD-10-CM | POA: Diagnosis not present

## 2020-01-01 DIAGNOSIS — Z9889 Other specified postprocedural states: Secondary | ICD-10-CM

## 2020-01-01 DIAGNOSIS — I340 Nonrheumatic mitral (valve) insufficiency: Secondary | ICD-10-CM | POA: Insufficient documentation

## 2020-01-01 DIAGNOSIS — R0602 Shortness of breath: Secondary | ICD-10-CM | POA: Diagnosis not present

## 2020-01-01 DIAGNOSIS — M8580 Other specified disorders of bone density and structure, unspecified site: Secondary | ICD-10-CM | POA: Insufficient documentation

## 2020-01-01 HISTORY — PX: VIDEO BRONCHOSCOPY WITH ENDOBRONCHIAL NAVIGATION: SHX6175

## 2020-01-01 HISTORY — PX: VIDEO BRONCHOSCOPY WITH ENDOBRONCHIAL ULTRASOUND: SHX6177

## 2020-01-01 SURGERY — VIDEO BRONCHOSCOPY WITH ENDOBRONCHIAL NAVIGATION
Anesthesia: General

## 2020-01-01 MED ORDER — CHLORHEXIDINE GLUCONATE 0.12 % MT SOLN: 15.0000 mL | Freq: Once | OROMUCOSAL | Status: AC

## 2020-01-01 MED ORDER — LIDOCAINE HCL (CARDIAC) PF 100 MG/5ML IV SOSY
PREFILLED_SYRINGE | INTRAVENOUS | Status: DC | PRN
  Administered 2020-01-01: 50 mg via INTRAVENOUS

## 2020-01-01 MED ORDER — SODIUM CHLORIDE 0.9 % IV SOLN
INTRAVENOUS | Status: DC | PRN
  Administered 2020-01-01: 25 ug/min via INTRAVENOUS

## 2020-01-01 MED ORDER — PHENYLEPHRINE HCL (PRESSORS) 10 MG/ML IV SOLN
INTRAVENOUS | Status: DC | PRN
  Administered 2020-01-01 (×4): 100 ug via INTRAVENOUS

## 2020-01-01 MED ORDER — ORAL CARE MOUTH RINSE: 15.0000 mL | Freq: Once | OROMUCOSAL | Status: AC

## 2020-01-01 MED ORDER — EPHEDRINE SULFATE 50 MG/ML IJ SOLN: INTRAMUSCULAR | Status: DC | PRN

## 2020-01-01 MED ORDER — DEXAMETHASONE SODIUM PHOSPHATE 10 MG/ML IJ SOLN
INTRAMUSCULAR | Status: DC | PRN
  Administered 2020-01-01: 10 mg via INTRAVENOUS

## 2020-01-01 MED ORDER — FENTANYL CITRATE (PF) 100 MCG/2ML IJ SOLN
INTRAMUSCULAR | Status: AC
  Filled 2020-01-01: qty 2

## 2020-01-01 MED ORDER — ALBUTEROL SULFATE (2.5 MG/3ML) 0.083% IN NEBU
INHALATION_SOLUTION | RESPIRATORY_TRACT | Status: AC
  Filled 2020-01-01: qty 3

## 2020-01-01 MED ORDER — FAMOTIDINE 20 MG PO TABS
ORAL_TABLET | ORAL | Status: AC
  Administered 2020-01-01: 20 mg via ORAL
  Filled 2020-01-01: qty 1

## 2020-01-01 MED ORDER — ALBUTEROL SULFATE (2.5 MG/3ML) 0.083% IN NEBU
2.5000 mg | INHALATION_SOLUTION | Freq: Four times a day (QID) | RESPIRATORY_TRACT | Status: DC | PRN
  Administered 2020-01-01: 2.5 mg via RESPIRATORY_TRACT

## 2020-01-01 MED ORDER — SUGAMMADEX SODIUM 500 MG/5ML IV SOLN
INTRAVENOUS | Status: AC
  Filled 2020-01-01: qty 5

## 2020-01-01 MED ORDER — BUTAMBEN-TETRACAINE-BENZOCAINE 2-2-14 % EX AERO
1.0000 | INHALATION_SPRAY | Freq: Once | CUTANEOUS | Status: DC
  Filled 2020-01-01: qty 20

## 2020-01-01 MED ORDER — ROCURONIUM BROMIDE 100 MG/10ML IV SOLN
INTRAVENOUS | Status: DC | PRN
  Administered 2020-01-01: 10 mg via INTRAVENOUS
  Administered 2020-01-01: 30 mg via INTRAVENOUS

## 2020-01-01 MED ORDER — CHLORHEXIDINE GLUCONATE 0.12 % MT SOLN
OROMUCOSAL | Status: AC
  Administered 2020-01-01: 15 mL via OROMUCOSAL
  Filled 2020-01-01: qty 15

## 2020-01-01 MED ORDER — FAMOTIDINE 20 MG PO TABS: 20.0000 mg | ORAL_TABLET | Freq: Once | ORAL | Status: AC

## 2020-01-01 MED ORDER — FENTANYL CITRATE (PF) 100 MCG/2ML IJ SOLN
INTRAMUSCULAR | Status: DC | PRN
  Administered 2020-01-01 (×2): 50 ug via INTRAVENOUS

## 2020-01-01 MED ORDER — LACTATED RINGERS IV SOLN: INTRAVENOUS | Status: DC

## 2020-01-01 MED ORDER — SODIUM CHLORIDE 0.9 % IV SOLN: Freq: Once | INTRAVENOUS | Status: AC

## 2020-01-01 MED ORDER — GLYCOPYRROLATE 0.2 MG/ML IJ SOLN
INTRAMUSCULAR | Status: DC | PRN
  Administered 2020-01-01: .2 mg via INTRAVENOUS

## 2020-01-01 MED ORDER — PROPOFOL 10 MG/ML IV BOLUS
INTRAVENOUS | Status: DC | PRN
  Administered 2020-01-01: 70 mg via INTRAVENOUS

## 2020-01-01 MED ORDER — SUCCINYLCHOLINE CHLORIDE 20 MG/ML IJ SOLN
INTRAMUSCULAR | Status: DC | PRN
  Administered 2020-01-01 (×2): 100 mg via INTRAVENOUS

## 2020-01-01 MED ORDER — PHENYLEPHRINE HCL (PRESSORS) 10 MG/ML IV SOLN: INTRAVENOUS | Status: DC | PRN

## 2020-01-01 MED ORDER — ONDANSETRON HCL 4 MG/2ML IJ SOLN
INTRAMUSCULAR | Status: DC | PRN
  Administered 2020-01-01: 4 mg via INTRAVENOUS

## 2020-01-01 NOTE — Transfer of Care (Signed)
Immediate Anesthesia Transfer of Care Note  Patient: Sherri [name]  Procedure(s) Performed: VIDEO BRONCHOSCOPY WITH ENDOBRONCHIAL NAVIGATION WITH CELLZIZIO (N/A ) VIDEO BRONCHOSCOPY WITH ENDOBRONCHIAL ULTRASOUND (N/A )  Patient Location: PACU  Anesthesia Type:General  Level of Consciousness: awake, alert , oriented and patient cooperative  Airway & Oxygen Therapy: Patient Spontanous Breathing  Post-op Assessment: Report given to RN and Post -op Vital signs reviewed and stable  Post vital signs: Reviewed and stable  Last Vitals:  Vitals Value Taken Time  BP 152/88 01/01/20 1446  Temp    Pulse 70 01/01/20 1448  Resp 11 01/01/20 1448  SpO2 96 % 01/01/20 1448  Vitals shown include unvalidated device data.  Last Pain:  Vitals:   01/01/20 1208  PainSc: 0-No pain         Complications: No complications documented.

## 2020-01-01 NOTE — H&P (Signed)
Sherri [name] is an 86 y.o. female.   Chief Complaint: Shortness of breath Reason for consultation: Bilateral lung nodules and mediastinal adenopathy in the setting of prior breast cancer  Requesting physician: Delight Hoh, MD   HPI: Ms. Lizer is an 86 year old lifelong never smoker with a history of stage IIb ER/PR positive upper inner quadrant right breast cancer status post lumpectomy and radiation therapy in 2013.  The patient received Arimidex 6 months prior to her lumpectomy and adjuvant XRT afterwards.  She did not receive chemotherapy.  Patient completed 5 years of Arimidex in October 2018.  Most recent mammogram was in August 2020 at that time no evidence of recurrence was noted.  Over the last 1 to 2 months as noted increased shortness of breath where she could hardly walk due to the sensation of breathlessness.  She states that sitting down and resting helped.  She has been fairly active and this was a significant change for her.  Around that same time she started noticing some right lower extremity edema.  She underwent evaluation by [facility] for the symptom.  On 10 June showed multiple lung nodules.  This prompted a CT scan of the chest to be obtained.  This was obtained on 16 November 2019 and showed multiple lung nodules as well as mediastinal adenopathy concerning for metastatic disease from breast cancer origin.  CA 27-29 has also been noted to be elevated consistent with metastatic disease as well.  She presents today for evaluation for potential bronchoscopy for diagnosis of these lesions in the mediastinal adenopathy.  Since her initial presenting issue with dyspnea she has improved somewhat regards to dyspnea.  She has not had any orthopnea or paroxysmal nocturnal dyspnea.  No chest pain.  No nausea or vomiting.  No abdominal pain.  She does not describe any anorexia or weight loss.  She states that she was actually "perfectly fine" until 1 to 2 months ago.  She does  not describe any hemoptysis or sputum production.  No cough.  No fevers, chills or sweats.  Her only only complaint is that of right lower extremity edema.  She has had venous Dopplers that do not show any DVT.  She voices no other complaint  Past Medical History:  Diagnosis Date  . Anxiety   . Arthritis   . Breast cancer ([place]) 2013   RIGHT lumpectomy  . Bursitis of elbow    RIGHT  . Cancer [facility]) 2013   right breast lumpectomy with a few nodes removed  . Chronic kidney disease    stage 3  . Colitis   . Coronary artery disease   . Diverticulitis   . Dysrhythmia    according to anxiety, tachy  . GERD (gastroesophageal reflux disease)    okay since took her gallbladder out  . H/O lumpectomy   . Heart disease   . History of coronary artery stent placement 2007   one stent  . HOH (hard of hearing)   . Hypercholesteremia   . Hypertension   . Hypothyroidism   . Myocardial infarction ([place])    2007  . Osteopenia   . Palpitations   . Personal history of radiation therapy   . Skin cancer     Past Surgical History:  Procedure Laterality Date  . BREAST LUMPECTOMY Right 2013   lymph node dissection  . CATARACT EXTRACTION W/PHACO Right 07/23/2015   Procedure: CATARACT EXTRACTION PHACO AND INTRAOCULAR LENS PLACEMENT (IOC);  Surgeon: Birder Robson, MD;  Location: [facility]  ORS;  Service: Ophthalmology;  Laterality: Right;  [place] 01:24 AP% 26.7 CDE 22.47 fluid pack lot # [id number]  . CATARACT EXTRACTION W/PHACO Left 08/13/2015   Procedure: CATARACT EXTRACTION PHACO AND INTRAOCULAR LENS PLACEMENT (IOC);  Surgeon: Birder Robson, MD;  Location: [facility] ORS;  Service: Ophthalmology;  Laterality: Left;  [place] 02:40 AP% 30.7 CDE 49.28 fluid pack lot # [id number]  . CHOLECYSTECTOMY    . COLONOSCOPY    . CORONARY ANGIOPLASTY  2007  . EYE SURGERY     bilateral cataract  . HARDWARE REMOVAL Right 04/29/2016   Procedure: HARDWARE REMOVAL;  Surgeon: Hessie Knows, MD;  Location: [facility] ORS;  Service:  Orthopedics;  Laterality: Right;  . ORIF ELBOW FRACTURE Right 10/01/2015   Procedure: OPEN REDUCTION INTERNAL FIXATION (ORIF) ELBOW/OLECRANON FRACTURE;  Surgeon: Hessie Knows, MD;  Location: [facility] ORS;  Service: Orthopedics;  Laterality: Right;    Family History  Problem Relation Age of Onset  . Breast cancer Sister 9   Social History:  reports that she quit smoking about 64 years ago. Her smoking use included cigarettes. She has a 0.50 pack-year smoking history. She has never used smokeless tobacco. She reports current alcohol use. She reports that she does not use drugs.  Allergies:  Allergies  Allergen Reactions  . Prednisone Other (See Comments)    Mood alterations    Medications Prior to Admission  Medication Sig Dispense Refill  . acetaminophen (TYLENOL) 500 MG tablet Take 500 mg by mouth every 8 (eight) hours as needed for moderate pain.    Marland Kitchen aspirin EC 81 MG tablet Take 81 mg by mouth at bedtime. Swallow whole.    . carvedilol (COREG) 3.125 MG tablet Take 3.125 mg by mouth 2 (two) times daily.     . Cholecalciferol (VITAMIN D-3 PO) Take 2,000 mg by mouth at bedtime.     . Coenzyme Q10 (CO Q 10 PO) Take 100 mg by mouth daily.    Arna Medici 25 MCG tablet Take 25 mcg by mouth daily before breakfast. 30 minutes to 1 hour prior to eating    . lisinopril (PRINIVIL,ZESTRIL) 10 MG tablet Take 10 mg by mouth in the morning and at bedtime.     . nitroGLYCERIN (NITROSTAT) 0.4 MG SL tablet Place 0.4 mg under the tongue every 5 (five) minutes x 3 doses as needed for chest pain.     . simvastatin (ZOCOR) 40 MG tablet Take 40 mg by mouth at bedtime.      No results found for this or any previous visit (from the past 48 hour(s)). No results found.  Review of Systems  A 10 point review of systems was performed and it is as noted above otherwise negative. Blood pressure (!) 187/89, pulse 74, resp. rate 14, weight 57.2 kg, SpO2 95 %. Physical Exam  GENERAL: Thin well-developed female in no  acute distress.  She is very spry, alert, fully ambulatory and conversant. HEAD: Normocephalic, atraumatic.  EYES: Pupils equal, round, reactive to light.  No scleral icterus.  MOUTH: Nose/mouth/throat not examined due to masking requirements for COVID 19. NECK: Supple. No thyromegaly. Trachea midline. No JVD.  No adenopathy. PULMONARY: Lungs clear to auscultation bilaterally.  Good air entry bilaterally. CARDIOVASCULAR: S1 and S2. Regular rate and rhythm.  No overt rub, murmur or gallop heard.  Pulses are diminished on the DP and PT bilaterally. GASTROINTESTINAL: No abdominal distention MUSCULOSKELETAL: No joint deformity, no clubbing, he has mild rubor and edema of the right lower extremity.  NEUROLOGIC: Awake,  alert, no focal deficit noted. SKIN: Intact,warm,dry.  Loss of hair distally on the lower extremities. PSYCH: Mood and behavior is appropriate   Films have been reviewed and are as shown on 11/29/2019 consultation note.    Assessment/Plan 1. SOB (shortness of breath)  R06.02 ECHOCARDIOGRAM COMPLETE   Evaluate with 2D echo given history of coronary artery disease  2. Multiple subsolid lung nodules greater than 6 mm in diameter  R91.8    Associated with elevated CA 27.29 likely recurrence from breast cancer Recommend navigational bronchoscopy to biopsy index lung nodule  3. Mediastinal adenopathy  R59.0    Suspect this is also related to recurrent breast cancer We will perform EBUS at the time of bronchoscopy  4. Decreased pulses in feet  R09.89 US ARTERIAL LOWER EXTREMITY DUPLEX BILATERAL   Markedly decreased pulses in the feet Will evaluate with arterial Dopplers Venous studies have been negative  5. Coronary artery disease involving native coronary artery of native heart without angina pectoris  I25.10    This issue adds complexity to her management Follows with [facility]   6. Mitral valve insufficiency, unspecified etiology  I34.0    This issue adds  complexity to her management Follows with [facility] Reschedule 2D echo that patient had canceled  7. Malignant neoplasm of upper-inner quadrant of right breast in female, estrogen receptor positive ([place])  C50.211    Z17.0    This issue adds complexity to her management She likely has recurrence of this issue Initially stage as IIb ER/PR positive   Orders Placed This Encounter  Procedures  . US ARTERIAL LOWER EXTREMITY DUPLEX BILATERAL    Standing Status:   Future    Standing Expiration Date:   11/28/2020    Order Specific Question:   Reason for Exam (SYMPTOM  OR DIAGNOSIS REQUIRED)    Answer:   LEG PAIN- DECREASE PULSES.    Order Specific Question:   Preferred imaging location?    Answer:   [facility]  . ECHOCARDIOGRAM COMPLETE    Standing Status:   Future    Standing Expiration Date:   05/30/2020    Order Specific Question:   Where should this test be performed    Answer:   [facility]    Order Specific Question:   Please indicate who you request to read the echo results.    Answer:   [facility]    Order Specific Question:   Perflutren DEFINITY (image enhancing agent) should be administered unless hypersensitivity or allergy exist    Answer:   Administer Perflutren    Order Specific Question:   Reason for exam-Echo    Answer:   Dyspnea  786.09 / R06.00    Order Specific Question:   Other Comments    Answer:   hx of MI 2007   Discussion:  The procedure of navigational bronchoscopy and endobronchial ultrasound for acquisition of tissue was discussed with the patient.  Son was present and they were both able to ask questions and these were answered to their satisfaction.  Benefits, limitations and potential complications of the procedure were discussed with the patient/son including, but not limited to bleeding, hemoptysis, respiratory failure requiring intubation and/or prolongued mechanical ventilation,  infection, pneumothorax (collapse of lung) requiring chest tube placement, stroke from air embolism or even death.  Patient understands these and agrees to proceed.  She also understands the procedure will be done under general anesthesia.  She will need evaluation of arterial flow of the lower extremities  as well as evaluation with echocardiogram prior to the procedure to ensure that she will be able to tolerate general anesthesia without difficulty.  Procedure is tentatively schedule for 16 July at 1 PM.  We will see the patient in follow-up 4 to 6 weeks time after the procedure.  We will stay in contact with oncology with regards to the results.  Thank you for allowing me to participate in the care of this patient.  Total time of this visit in direct and indirect care as well as coordination of care and procedures 65 minutes.    Addendum:  Her initial evaluation the patient has undergone cardiac evaluation with cardiac stress test and 2D echo.  She has cleared for surgery by Dr. Nehemiah Massed her cardiologist.  Her initial date of procedure was going to be 62 July however due to tainted water concerns and [facility] an incomplete cardiac work-up this was postponed.  Patient returns today to proceed with the navigational bronchoscopy and EBUS for diagnosis of her metastatic disease.  PET/CT also confirmed metastatic disease.  Patient was provided opportunity to ask any further questions but states that she is satisfied with previous answers and wishes to go ahead. Patient had the correct side marked.   Renold Don, MD [place] [facility]  01/01/2020, 12:44 PM  *This note was dictated using voice recognition software/Dragon.  Despite best efforts to proofread, errors can occur which can change the meaning.  Any change was purely unintentional.

## 2020-01-01 NOTE — Discharge Instructions (Signed)

## 2020-01-01 NOTE — Anesthesia Procedure Notes (Signed)
Procedure Name: Intubation Performed by: Fletcher-Harrison, Shannette Tabares, CRNA Pre-anesthesia Checklist: Patient identified, Emergency Drugs available, Suction available and Patient being monitored Patient Re-evaluated:Patient Re-evaluated prior to induction Oxygen Delivery Method: Circle system utilized Preoxygenation: Pre-oxygenation with 100% oxygen Induction Type: IV induction Ventilation: Mask ventilation without difficulty Laryngoscope Size: McGraph and 3 Grade View: Grade I Tube type: Oral Tube size: 8.5 mm Number of attempts: 1 Airway Equipment and Method: Stylet and Oral airway Placement Confirmation: ETT inserted through vocal cords under direct vision,  positive ETCO2,  breath sounds checked- equal and bilateral and CO2 detector Secured at: 21 cm Tube secured with: Tape Dental Injury: Teeth and Oropharynx as per pre-operative assessment        

## 2020-01-01 NOTE — Op Note (Addendum)
Electromagnetic Navigation Bronchoscopy  Endobronchial ultrasound  Endomicroscopy  Indication: Multiple lung nodules, mediastinal adenopathy, history of carcinoma of the breast  Preoperative Diagnosis: Multiple lung nodules, mediastinal adenopathy  Post Procedure Diagnosis: Same as above  Consent: Verbal/Written  Benefits, limitations and potential complications of the procedure were discussed with the patient including, but not limited to bleeding, hemoptysis, respiratory failure requiring intubation and/or prolongued mechanical ventilation, infection, pneumothorax (collapse of lung) requiring chest tube placement, stroke from air embolism or even death. The patient understands the risks and benefits and has agreed to go ahead with procedure.  STAFF Surgeon: Renold Don, MD Assistant/Scrub: Liborio Nixon, RRT Anesthesiologist/CRNA: Vashti Hey, MD/Tawana Lowella Curb, CRNA ROSE available: Yes, [facility]  Type of Anesthesia: General endotracheal  Procedure Performed:  Virtual Bronchoscopy with Multi-planar Image analysis, 3-D reconstruction of coronal, sagittal and multi-planar images for the purposes of planning real-time bronchoscopy using the iLogic Electromagnetic Navigation Bronchoscopy System (superDimension).  Endobronchial ultrasound with TBNA.  Description of Procedure: Patient was taken to Procedure Room 2 (Bronchoscopy Suite) in the OR area.  Appropriate timeout was taken with the staff.  The patient was inducted under general anesthesia by the anesthesiologist/CRNA team.  Patient was inducted into general anesthesia and intubated with an 8.5 ET tube without difficulty.  A Portex adapter was placed on the ET tube flange.  Once the patient was noted to be an adequate general anesthesia state, the Olympus video therapeutic bronchoscope was advanced into the existing ET tube.  Anatomic tour of the bronchial tree was performed.  Patient had a tortuous trachea, carina was  sharp, the right tracheobronchial tree showed no lesions.  The left tracheobronchial tree showed no lesions.  The airways only showed age-related changes.  Once anatomical tour had completed.  The datable guide (LG) and sheath combination was introduced into the working channel of the bronchoscope.  At this point the LG was then placed into the central portion of the trachea. The LG was directed to standard registration points at the following centers: main carina, right upper lobe bronchus, right lower lobe bronchus, right middle lobe bronchus, left upper r lobe bronchus, and the left lower lobe bronchus. This data was transferred to the i-Logic ENB system for real-time bronchoscopy.  Once registration was completed navigation to the LEFT lower lobe superior subsegment towards the target was done. The target was approximated using the superDimension system.  Because the lesion targeted did not have a airway traversing it, further acquisition of the target required Endomicroscopy with Cellvizio confocal laser Endomicroscopy.  The LG was removed and the Cellvizio system was then used to further approximate the catheter to the target.  Abnormal tissue was recognized with the Cellvizio system under fluoroscopic guidance.  The extended working channel sheath was then locked in place and sampling was begun.  Under fluoroscopic guidance brushings were performed.  First pass with the brush showed malignant cells.  Additional 4 passes were done at this point.  Once these were obtained biopsies with super dimension forceps were performed using fluoroscopic guidance.  The first biopsy specimen also showed malignant cells.  Additional 9 specimens were obtained under fluoroscopic guidance.  Once this was completed targeted bronchoalveolar lavage was performed on this area.  A total of 40 mL of saline was instilled yielding approximately 10 mL of aliquot.  Aliquot placed in preservative solution.  Once this was completed the  bronchoscope was retrieved and exchanged for a Olympus endobronchial ultrasound scope.  The bronchoscope was advanced through the existing  Portex adapter into the ET tube and through to the airway.  The mediastinal structures were examined.  The area of interest was the subcarinal space as this was very FDG avid on PET/CT.  Examination of the subcarinal space with the endobronchial ultrasound revealed an enlarged lymph node measuring 2 cm in diameter and 3-1/2-4 cm long.  At this point this lymph node was sampled utilizing a Cook 25-gauge EBUS needle.  Malignant cells were noted on the first pass.  A total of 8 passes were performed and material was placed in CytoLyt for analysis.  Once this was completed the airway was examined and excellent hemostasis was noted.  Total blood loss was less than 2 mL.  The patient received 9 mL of lidocaine 1% to the airway via bronchial lavage.  The bronchoscope was then removed and the procedure was terminated.  Patient was allowed to emerge from general anesthesia in the procedure room.  She was extubated without sequela.  She was transferred to the PACU in satisfactory condition.  Postprocedure chest x-ray showed no pneumothorax.  Patient tolerated the procedure well.  Specimens Obtained:  EBUS TBNA 25 gauge:   X8  ENB Transbronchial Forceps Biopsy: X 8  ENB Transbronchial Brushings : X 4  ENB targeted BAL: 10 mL aliquot   Fluoroscopy:  Fluoroscopy was utilized during the course of this procedure to assure that biopsies were taken in a safe manner under fluoroscopic guidance with spot films required.     Complications: None, chest x-ray showed no pneumothorax postprocedure.   Estimated Blood Loss: minimal less than 2 mL   Assessment and Plan  Multiple lung nodules, malignant by preliminary findings  Mediastinal adenopathy, malignant by preliminary findings  History of to be ER/PR positive breast cancer  Follow up Pathology Reports Patient will be  notified once results are available   C. Derrill Kay, MD [place] [facility]   *This note was dictated using voice recognition software/Dragon.  Despite best efforts to proofread, errors can occur which can change the meaning.  Any change was purely unintentional.

## 2020-01-01 NOTE — Anesthesia Preprocedure Evaluation (Signed)
Anesthesia Evaluation  Patient identified by MRN, date of birth, ID band Patient awake    Reviewed: Allergy & Precautions, H&P , NPO status , Patient's Chart, lab work & pertinent test results, reviewed documented beta blocker date and time   Airway Mallampati: III  TM Distance: >3 FB Neck ROM: full    Dental  (+) Teeth Intact   Pulmonary neg pulmonary ROS, former smoker,    Pulmonary exam normal        Cardiovascular Exercise Tolerance: Poor hypertension, On Medications + CAD and + Past MI  Normal cardiovascular exam+ dysrhythmias  Rhythm:regular Rate:Normal  July eval and clearance per Dr. Nehemiah Massed.  JA   Neuro/Psych Anxiety negative neurological ROS  negative psych ROS   GI/Hepatic Neg liver ROS, GERD  Medicated,  Endo/Other  Hypothyroidism   Renal/GU Renal disease  negative genitourinary   Musculoskeletal   Abdominal   Peds  Hematology negative hematology ROS (+)   Anesthesia Other Findings Past Medical History: No date: Anxiety No date: Arthritis 2013: Breast cancer ([place])     Comment:  RIGHT lumpectomy No date: Bursitis of elbow     Comment:  RIGHT 2013: Cancer ([place])     Comment:  right breast lumpectomy with a few nodes removed No date: Chronic kidney disease     Comment:  stage 3 No date: Colitis No date: Coronary artery disease No date: Diverticulitis No date: Dysrhythmia     Comment:  according to anxiety, tachy No date: GERD (gastroesophageal reflux disease)     Comment:  okay since took her gallbladder out No date: H/O lumpectomy No date: Heart disease 2007: History of coronary artery stent placement     Comment:  one stent No date: HOH (hard of hearing) No date: Hypercholesteremia No date: Hypertension No date: Hypothyroidism No date: Myocardial infarction [facility])     Comment:  2007 No date: Osteopenia No date: Palpitations No date: Personal history of radiation therapy No date: Skin  cancer Past Surgical History: 2013: BREAST LUMPECTOMY; Right     Comment:  lymph node dissection 07/23/2015: CATARACT EXTRACTION W/PHACO; Right     Comment:  Procedure: CATARACT EXTRACTION PHACO AND INTRAOCULAR               LENS PLACEMENT ([place]);  Surgeon: Birder Robson, MD;                Location: [facility] ORS;  Service: Ophthalmology;  Laterality:              Right;  [place] 01:24 AP% 26.7 CDE 22.47 fluid pack lot #               [id number] 08/13/2015: CATARACT EXTRACTION W/PHACO; Left     Comment:  Procedure: CATARACT EXTRACTION PHACO AND INTRAOCULAR               LENS PLACEMENT (IOC);  Surgeon: Birder Robson, MD;                Location: [facility] ORS;  Service: Ophthalmology;  Laterality:              Left;  [place] 02:40 AP% 30.7 CDE 49.28 fluid pack lot #               [id number] No date: CHOLECYSTECTOMY No date: COLONOSCOPY 2007: CORONARY ANGIOPLASTY No date: EYE SURGERY     Comment:  bilateral cataract 04/29/2016: HARDWARE REMOVAL; Right     Comment:  Procedure: HARDWARE REMOVAL;  Surgeon: Hessie Knows, MD;  Location: [facility] ORS;  Service: Orthopedics;  Laterality:               Right; 10/01/2015: ORIF ELBOW FRACTURE; Right     Comment:  Procedure: OPEN REDUCTION INTERNAL FIXATION (ORIF)               ELBOW/OLECRANON FRACTURE;  Surgeon: Hessie Knows, MD;                Location: [facility] ORS;  Service: Orthopedics;  Laterality:               Right; BMI    Body Mass Index: 21.63 kg/m     Reproductive/Obstetrics negative OB ROS                             Anesthesia Physical Anesthesia Plan  ASA: IV  Anesthesia Plan: General ETT   Post-op Pain Management:    Induction:   PONV Risk Score and Plan:   Airway Management Planned:   Additional Equipment:   Intra-op Plan:   Post-operative Plan:   Informed Consent: I have reviewed the patients History and Physical, chart, labs and discussed the procedure including the risks, benefits and  alternatives for the proposed anesthesia with the patient or authorized representative who has indicated his/her understanding and acceptance.     Dental Advisory Given  Plan Discussed with: CRNA  Anesthesia Plan Comments:         Anesthesia Quick Evaluation

## 2020-01-02 ENCOUNTER — Encounter: Payer: Self-pay | Admitting: Pulmonary Disease

## 2020-01-02 NOTE — Anesthesia Postprocedure Evaluation (Signed)
Anesthesia Post Note  Patient: [name]  Procedure(s) Performed: VIDEO BRONCHOSCOPY WITH ENDOBRONCHIAL NAVIGATION WITH CELLZIZIO (N/A ) VIDEO BRONCHOSCOPY WITH ENDOBRONCHIAL ULTRASOUND (N/A )  Patient location during evaluation: PACU Anesthesia Type: General Level of consciousness: awake and alert and oriented Pain management: pain level controlled Vital Signs Assessment: post-procedure vital signs reviewed and stable Respiratory status: spontaneous breathing, nonlabored ventilation and respiratory function stable Cardiovascular status: blood pressure returned to baseline and stable Postop Assessment: no signs of nausea or vomiting Anesthetic complications: no   No complications documented.   Last Vitals:  Vitals:   01/01/20 1553 01/01/20 1612  BP: (!) 146/54 (!) 152/78  Pulse: (!) 56 68  Resp: 18 18  Temp: 36.4 C   SpO2: 93% 91%    Last Pain:  Vitals:   01/01/20 1612  TempSrc:   PainSc: 0-No pain                 Jermiyah Ricotta

## 2020-01-03 ENCOUNTER — Other Ambulatory Visit: Payer: Self-pay | Admitting: Oncology

## 2020-01-03 ENCOUNTER — Institutional Professional Consult (permissible substitution): Payer: Medicare PPO | Admitting: Pulmonary Disease

## 2020-01-03 LAB — CYTOLOGY - NON PAP

## 2020-01-03 LAB — SURGICAL PATHOLOGY

## 2020-01-05 ENCOUNTER — Other Ambulatory Visit: Payer: Self-pay | Admitting: Oncology

## 2020-01-05 DIAGNOSIS — C569 Malignant neoplasm of unspecified ovary: Secondary | ICD-10-CM | POA: Insufficient documentation

## 2020-01-05 DIAGNOSIS — N135 Crossing vessel and stricture of ureter without hydronephrosis: Secondary | ICD-10-CM

## 2020-01-05 DIAGNOSIS — R590 Localized enlarged lymph nodes: Secondary | ICD-10-CM

## 2020-01-05 NOTE — Progress Notes (Signed)
[place]  [phone] [phone]  ID: Sherri [name] OB: 01/27/1933  MR#: [id number]  BEM#:[id number]  Patient Care Team: [name], MD as PCP - General (Family Medicine) Lloyd Huger, MD as Consulting Physician (Oncology) Clent Jacks, RN as Oncology Nurse Navigator  CHIEF COMPLAINT: Stage IV ovarian cancer.  INTERVAL HISTORY: Patient returns to clinic today to discuss her biopsy and imaging results as well as treatment planning.  She has dyspnea on exertion, but otherwise feels well.  She has no neurologic complaints.  She denies any recent fevers or illnesses.  She has a good appetite and denies weight loss.  She has no chest pain, cough, or hemoptysis.  She denies any abdominal pain.  She denies any nausea, vomiting, constipation, or diarrhea.  She has no urinary complaints.  Patient offers no further specific complaints today.  REVIEW OF SYSTEMS:   Review of Systems  Constitutional: Negative.  Negative for diaphoresis, fever, malaise/fatigue and weight loss.  Respiratory: Positive for shortness of breath. Negative for cough.   Cardiovascular: Negative.  Negative for chest pain and leg swelling.  Gastrointestinal: Negative.  Negative for abdominal pain.  Genitourinary: Negative.  Negative for dysuria.  Musculoskeletal: Negative.  Negative for joint pain.  Skin: Negative.  Negative for rash.  Neurological: Negative.  Negative for dizziness, sensory change, weakness and headaches.  Psychiatric/Behavioral: Negative.  The patient is not nervous/anxious.     As per HPI. Otherwise, a complete review of systems is negative.  PAST MEDICAL HISTORY: Past Medical History:  Diagnosis Date  . Anxiety   . Arthritis   . Breast cancer ([place]) 2013   RIGHT lumpectomy  . Bursitis of elbow    RIGHT  . Cancer [facility]) 2013   right breast lumpectomy with a few nodes removed  . Chronic kidney disease    stage 3  . Colitis   .  Coronary artery disease   . Diverticulitis   . Dysrhythmia    according to anxiety, tachy  . GERD (gastroesophageal reflux disease)    okay since took her gallbladder out  . H/O lumpectomy   . Heart disease   . History of coronary artery stent placement 2007   one stent  . HOH (hard of hearing)   . Hypercholesteremia   . Hypertension   . Hypothyroidism   . Myocardial infarction ([place])    2007  . Osteopenia   . Palpitations   . Personal history of radiation therapy   . Skin cancer     PAST SURGICAL HISTORY: Past Surgical History:  Procedure Laterality Date  . BREAST LUMPECTOMY Right 2013   lymph node dissection  . CATARACT EXTRACTION W/PHACO Right 07/23/2015   Procedure: CATARACT EXTRACTION PHACO AND INTRAOCULAR LENS PLACEMENT (IOC);  Surgeon: Birder Robson, MD;  Location: [facility] ORS;  Service: Ophthalmology;  Laterality: Right;  [place] 01:24 AP% 26.7 CDE 22.47 fluid pack lot # [id number]  . CATARACT EXTRACTION W/PHACO Left 08/13/2015   Procedure: CATARACT EXTRACTION PHACO AND INTRAOCULAR LENS PLACEMENT (IOC);  Surgeon: Birder Robson, MD;  Location: [facility] ORS;  Service: Ophthalmology;  Laterality: Left;  [place] 02:40 AP% 30.7 CDE 49.28 fluid pack lot # [id number]  . CHOLECYSTECTOMY    . COLONOSCOPY    . CORONARY ANGIOPLASTY  2007  . EYE SURGERY     bilateral cataract  . HARDWARE REMOVAL Right 04/29/2016   Procedure: HARDWARE REMOVAL;  Surgeon: Hessie Knows, MD;  Location: [facility] ORS;  Service: Orthopedics;  Laterality: Right;  .  ORIF ELBOW FRACTURE Right 10/01/2015   Procedure: OPEN REDUCTION INTERNAL FIXATION (ORIF) ELBOW/OLECRANON FRACTURE;  Surgeon: Hessie Knows, MD;  Location: [facility] ORS;  Service: Orthopedics;  Laterality: Right;  Marland Kitchen VIDEO BRONCHOSCOPY WITH ENDOBRONCHIAL NAVIGATION N/A 01/01/2020   Procedure: VIDEO BRONCHOSCOPY WITH ENDOBRONCHIAL NAVIGATION WITH CELLZIZIO;  Surgeon: Tyler Pita, MD;  Location: [facility] ORS;  Service: Pulmonary;  Laterality: N/A;  . VIDEO  BRONCHOSCOPY WITH ENDOBRONCHIAL ULTRASOUND N/A 01/01/2020   Procedure: VIDEO BRONCHOSCOPY WITH ENDOBRONCHIAL ULTRASOUND;  Surgeon: Tyler Pita, MD;  Location: [facility] ORS;  Service: Pulmonary;  Laterality: N/A;    FAMILY HISTORY: Reviewed and unchanged. No reported history of malignancy or chronic disease.     ADVANCED DIRECTIVES:    HEALTH MAINTENANCE: Social History   Tobacco Use  . Smoking status: Former Smoker    Packs/day: 0.25    Years: 2.00    Pack years: 0.50    Types: Cigarettes    Quit date: 12/12/1955    Years since quitting: 64.1  . Smokeless tobacco: Never Used  Vaping Use  . Vaping Use: Never used  Substance Use Topics  . Alcohol use: Yes    Comment: wine occasionally  . Drug use: No     Colonoscopy:  PAP:  Bone density:  Lipid panel:  Allergies  Allergen Reactions  . Prednisone Other (See Comments)    Mood alterations    Current Outpatient Medications  Medication Sig Dispense Refill  . acetaminophen (TYLENOL) 500 MG tablet Take 500 mg by mouth every 8 (eight) hours as needed for moderate pain.    Marland Kitchen aspirin EC 81 MG tablet Take 81 mg by mouth at bedtime. Swallow whole.    . carvedilol (COREG) 3.125 MG tablet Take 3.125 mg by mouth 2 (two) times daily.     . Cholecalciferol (VITAMIN D-3 PO) Take 2,000 mg by mouth at bedtime.     . Coenzyme Q10 (CO Q 10 PO) Take 100 mg by mouth daily.    Arna Medici 25 MCG tablet Take 25 mcg by mouth daily before breakfast. 30 minutes to 1 hour prior to eating    . lisinopril (PRINIVIL,ZESTRIL) 10 MG tablet Take 10 mg by mouth in the morning and at bedtime.     . nitroGLYCERIN (NITROSTAT) 0.4 MG SL tablet Place 0.4 mg under the tongue every 5 (five) minutes x 3 doses as needed for chest pain.     . simvastatin (ZOCOR) 40 MG tablet Take 40 mg by mouth at bedtime.     No current facility-administered medications for this visit.    OBJECTIVE: Vitals:   01/08/20 1008  BP: (!) 162/82  Pulse: 60  Resp: 18  Temp: (!)  95.7 F (35.4 C)  SpO2: 98%     Body mass index is 22.13 kg/m.    ECOG FS:0 - Asymptomatic  General: Well-developed, well-nourished, no acute distress. Eyes: Pink conjunctiva, anicteric sclera. HEENT: Normocephalic, moist mucous membranes. Lungs: No audible wheezing or coughing. Heart: Regular rate and rhythm. Abdomen: Soft, nontender, no obvious distention. Musculoskeletal: No edema, cyanosis, or clubbing. Neuro: Alert, answering all questions appropriately. Cranial nerves grossly intact. Skin: No rashes or petechiae noted. Psych: Normal affect.  LAB RESULTS:  Lab Results  Component Value Date   NA 132 (L) 11/28/2019   K 4.4 12/13/2019   CL 98 11/28/2019   CO2 26 11/28/2019   GLUCOSE 84 11/28/2019   BUN 32 (H) 11/28/2019   CREATININE 1.57 (H) 11/28/2019   CALCIUM 9.3 11/28/2019   PROT  8.2 (H) 11/28/2019   ALBUMIN 4.2 11/28/2019   AST 21 11/28/2019   ALT 11 11/28/2019   ALKPHOS 54 11/28/2019   BILITOT 0.6 11/28/2019   GFRNONAA 30 (L) 11/28/2019   GFRAA 35 (L) 11/28/2019    Lab Results  Component Value Date   WBC 12.4 (H) 11/28/2019   NEUTROABS 7.3 11/28/2019   HGB 12.6 11/28/2019   HCT 38.9 11/28/2019   MCV 92.6 11/28/2019   PLT 235 11/28/2019     STUDIES: NM PET Image Initial (PI) Skull Base To Thigh  Result Date: 12/11/2019 CLINICAL DATA:  Initial treatment strategy for pulmonary metastatic disease. EXAM: NUCLEAR MEDICINE PET SKULL BASE TO THIGH TECHNIQUE: 7.0 mCi F-18 FDG was injected intravenously. Full-ring PET imaging was performed from the skull base to thigh after the radiotracer. CT data was obtained and used for attenuation correction and anatomic localization. Fasting blood glucose: 95 mg/dl COMPARISON:  Chest CT 11/16/2019 FINDINGS: Mediastinal blood pool activity: SUV max 2.13 Liver activity: SUV max NA NECK: No hypermetabolic lymph nodes in the neck. Incidental CT findings: Carotid artery calcifications. CHEST: Diffuse pulmonary metastatic disease is  demonstrated with innumerable hypermetabolic pulmonary nodules. Right upper lobe nodule measuring 12 mm on image 56/3 has an SUV max of 7.58. 16 mm left upper lobe nodule on image 87/3 has an SUV max of 7.58. Bulky hypermetabolic mediastinal and hilar lymphadenopathy. 18 mm right paratracheal nodal lesion has an SUV max of 9.35. Subcarinal node measures 13 mm and has an SUV max of 9.01. 8.7 mm left subclavicular lymph node is hypermetabolic with SUV max of 8.52. No axillary adenopathy or hypermetabolic breast lesions. Incidental CT findings: Advanced vascular calcifications. Fusiform aneurysmal dilatation of the ascending thoracic aorta is stable. ABDOMEN/PELVIS: There is a large irregular right adnexal mass measuring approximately 6 x 5 cm. This is hypermetabolic with SUV max of 77.82. Looks like it may involve the uterus and the right ovary. It is obstructing the right ureter and causing severe chronic right-sided hydroureteronephrosis with very little residual renal parenchyma. There is associated fairly extensive common iliac and retroperitoneal lymphadenopathy. Nodal lesion near the duodenum and large duodenal diverticulum measures approximately 2.5 cm and the SUV max is 7.55. 10 mm left para-aortic lymph node has an SUV max of 7.97. There is patchy ill-defined nodularity in the omentum which demonstrates mild hypermetabolism and highly suspicious for omental disease. No findings for adrenal gland or hepatic metastatic disease. No inguinal adenopathy. Incidental CT findings: Advanced vascular calcifications. Large duodenal diverticulum. Chronic right-sided hydroureteronephrosis. SKELETON: No findings suspicious for osseous metastatic disease. Incidental CT findings: none IMPRESSION: 1. Diffuse pulmonary metastatic disease and extensive mediastinal and hilar lymphadenopathy. The primary lesion is most likely in the right pelvis and could be ovarian or uterine in origin. Associated extensive retroperitoneal  lymphadenopathy. 2. The right ureter is obstructed by the right pelvic mass with chronic right-sided hydroureteronephrosis and marked parenchymal thinning of the right kidney. 3. No findings suspicious for osseous metastatic disease. Electronically Signed   By: Marijo Sanes M.D.   On: 12/11/2019 13:02   DG Chest Port 1 View  Result Date: 01/01/2020 CLINICAL DATA:  Status post biopsy. EXAM: PORTABLE CHEST 1 VIEW COMPARISON:  November 16, 2019. FINDINGS: The heart size and mediastinal contours are within normal limits. No pneumothorax or pleural effusion is noted. Multiple irregular nodular densities in reticular densities are noted throughout both lungs consistent with metastatic disease as described on prior CT scan. The visualized skeletal structures are unremarkable. IMPRESSION: Multiple irregular  nodular densities and reticular densities are noted throughout both lungs consistent with metastatic disease as described on prior CT scan. Electronically Signed   By: Marijo Conception M.D.   On: 01/01/2020 15:43   DG C-Arm 1-60 Min-No Report  Result Date: 01/01/2020 Fluoroscopy was utilized by the requesting physician.  No radiographic interpretation.    ASSESSMENT: Stage IV ovarian cancer.  PLAN:    1.  Stage IV ovarian cancer: Despite history of breast cancer, biopsy consistent with carcinoma of gynecologic origin.  PET scan results from December 11, 2019 reviewed independently with large right pelvic region, extensive retroperitoneal lymphadenopathy and bilateral pulmonary disease consistent with metastatic ovarian cancer.  CA-125 is pending at time of dictation.  Patient wishes to attempt aggressive treatment with chemotherapy, therefore return to clinic in approximately 1 week to receive carboplatinum and Taxol.patient may require Udenyca support with subsequent treatments, but will hold for cycle 1.  She will receive this regimen every 3 weeks for 4-6 cycles.  Will get port placement in the next 1 to 2  weeks. 2.  History of breast cancer: Patient has a history of stage IIb ER/PR positive, upper inner quadrant right breast.  Previously we did not have access to patient's pathology report, her stage and location of breast were determined based on clinic notes.  She received 6 months of Arimidex between April 2013 and October 2013 prior to undergoing right breast lumpectomy. She reports she underwent adjuvant XRT, but did not receive adjuvant chemotherapy.  Patient completed 5 years of Arimidex in October 2018.  Her most recent mammogram on January 30, 2019 was reported as BI-RADS 1.    I spent a total of 30 minutes reviewing chart data, face-to-face evaluation with the patient, counseling and coordination of care as detailed above.   Patient expressed understanding and was in agreement with this plan. She also understands that She can call clinic at any time with any questions, concerns, or complaints.    Lloyd Huger, MD   01/08/2020 5:10 PM

## 2020-01-08 ENCOUNTER — Encounter: Payer: Self-pay | Admitting: Oncology

## 2020-01-08 ENCOUNTER — Other Ambulatory Visit: Payer: Self-pay

## 2020-01-08 ENCOUNTER — Telehealth: Payer: Self-pay | Admitting: *Deleted

## 2020-01-08 ENCOUNTER — Inpatient Hospital Stay: Payer: Medicare PPO | Attending: Oncology | Admitting: Oncology

## 2020-01-08 ENCOUNTER — Inpatient Hospital Stay: Payer: Medicare PPO

## 2020-01-08 VITALS — BP 162/82 | HR 60 | Temp 95.7°F | Resp 18 | Wt 128.9 lb

## 2020-01-08 DIAGNOSIS — Z955 Presence of coronary angioplasty implant and graft: Secondary | ICD-10-CM | POA: Insufficient documentation

## 2020-01-08 DIAGNOSIS — R002 Palpitations: Secondary | ICD-10-CM | POA: Diagnosis not present

## 2020-01-08 DIAGNOSIS — E7800 Pure hypercholesterolemia, unspecified: Secondary | ICD-10-CM | POA: Diagnosis not present

## 2020-01-08 DIAGNOSIS — F419 Anxiety disorder, unspecified: Secondary | ICD-10-CM | POA: Diagnosis not present

## 2020-01-08 DIAGNOSIS — N183 Chronic kidney disease, stage 3 unspecified: Secondary | ICD-10-CM | POA: Diagnosis not present

## 2020-01-08 DIAGNOSIS — Z87891 Personal history of nicotine dependence: Secondary | ICD-10-CM | POA: Diagnosis not present

## 2020-01-08 DIAGNOSIS — Z5189 Encounter for other specified aftercare: Secondary | ICD-10-CM | POA: Insufficient documentation

## 2020-01-08 DIAGNOSIS — C569 Malignant neoplasm of unspecified ovary: Secondary | ICD-10-CM

## 2020-01-08 DIAGNOSIS — E46 Unspecified protein-calorie malnutrition: Secondary | ICD-10-CM | POA: Insufficient documentation

## 2020-01-08 DIAGNOSIS — M1990 Unspecified osteoarthritis, unspecified site: Secondary | ICD-10-CM | POA: Insufficient documentation

## 2020-01-08 DIAGNOSIS — Z7189 Other specified counseling: Secondary | ICD-10-CM

## 2020-01-08 DIAGNOSIS — I10 Essential (primary) hypertension: Secondary | ICD-10-CM | POA: Insufficient documentation

## 2020-01-08 DIAGNOSIS — C7802 Secondary malignant neoplasm of left lung: Secondary | ICD-10-CM | POA: Insufficient documentation

## 2020-01-08 DIAGNOSIS — Z515 Encounter for palliative care: Secondary | ICD-10-CM | POA: Insufficient documentation

## 2020-01-08 DIAGNOSIS — I2510 Atherosclerotic heart disease of native coronary artery without angina pectoris: Secondary | ICD-10-CM | POA: Insufficient documentation

## 2020-01-08 DIAGNOSIS — D72828 Other elevated white blood cell count: Secondary | ICD-10-CM | POA: Insufficient documentation

## 2020-01-08 DIAGNOSIS — E860 Dehydration: Secondary | ICD-10-CM | POA: Insufficient documentation

## 2020-01-08 DIAGNOSIS — C561 Malignant neoplasm of right ovary: Secondary | ICD-10-CM | POA: Insufficient documentation

## 2020-01-08 DIAGNOSIS — Z7689 Persons encountering health services in other specified circumstances: Secondary | ICD-10-CM | POA: Insufficient documentation

## 2020-01-08 DIAGNOSIS — R110 Nausea: Secondary | ICD-10-CM | POA: Insufficient documentation

## 2020-01-08 DIAGNOSIS — R6881 Early satiety: Secondary | ICD-10-CM | POA: Insufficient documentation

## 2020-01-08 DIAGNOSIS — K219 Gastro-esophageal reflux disease without esophagitis: Secondary | ICD-10-CM | POA: Insufficient documentation

## 2020-01-08 DIAGNOSIS — Z5111 Encounter for antineoplastic chemotherapy: Secondary | ICD-10-CM | POA: Insufficient documentation

## 2020-01-08 DIAGNOSIS — R109 Unspecified abdominal pain: Secondary | ICD-10-CM | POA: Insufficient documentation

## 2020-01-08 DIAGNOSIS — N1330 Unspecified hydronephrosis: Secondary | ICD-10-CM | POA: Insufficient documentation

## 2020-01-08 DIAGNOSIS — I252 Old myocardial infarction: Secondary | ICD-10-CM | POA: Diagnosis not present

## 2020-01-08 DIAGNOSIS — R590 Localized enlarged lymph nodes: Secondary | ICD-10-CM

## 2020-01-08 DIAGNOSIS — E039 Hypothyroidism, unspecified: Secondary | ICD-10-CM | POA: Diagnosis not present

## 2020-01-08 DIAGNOSIS — R7989 Other specified abnormal findings of blood chemistry: Secondary | ICD-10-CM | POA: Insufficient documentation

## 2020-01-08 DIAGNOSIS — Z853 Personal history of malignant neoplasm of breast: Secondary | ICD-10-CM | POA: Diagnosis not present

## 2020-01-08 DIAGNOSIS — M8580 Other specified disorders of bone density and structure, unspecified site: Secondary | ICD-10-CM | POA: Insufficient documentation

## 2020-01-08 DIAGNOSIS — Z7982 Long term (current) use of aspirin: Secondary | ICD-10-CM | POA: Insufficient documentation

## 2020-01-08 DIAGNOSIS — Z79899 Other long term (current) drug therapy: Secondary | ICD-10-CM | POA: Diagnosis not present

## 2020-01-08 DIAGNOSIS — I959 Hypotension, unspecified: Secondary | ICD-10-CM | POA: Insufficient documentation

## 2020-01-08 DIAGNOSIS — F5102 Adjustment insomnia: Secondary | ICD-10-CM | POA: Insufficient documentation

## 2020-01-08 MED ORDER — LIDOCAINE-PRILOCAINE 2.5-2.5 % EX CREA: TOPICAL_CREAM | CUTANEOUS | 3 refills | Status: AC

## 2020-01-08 MED ORDER — ONDANSETRON HCL 8 MG PO TABS: 8.0000 mg | ORAL_TABLET | Freq: Two times a day (BID) | ORAL | 2 refills | Status: DC | PRN

## 2020-01-08 MED ORDER — PROCHLORPERAZINE MALEATE 10 MG PO TABS: 10.0000 mg | ORAL_TABLET | Freq: Four times a day (QID) | ORAL | 2 refills | Status: DC | PRN

## 2020-01-08 NOTE — Telephone Encounter (Signed)
Faxed port placement worksheet to patient spec. Scheduling on behalf of Dr.Finnegan's team.

## 2020-01-08 NOTE — Progress Notes (Signed)
START ON PATHWAY REGIMEN - Ovarian     A cycle is every 21 days:     Paclitaxel      Carboplatin   **Always confirm dose/schedule in your pharmacy ordering system**  Patient Characteristics: Preoperative or Nonsurgical Candidate (Clinical Staging), Newly Diagnosed, Neoadjuvant Therapy followed by Surgery Therapeutic Status: Preoperative or Nonsurgical Candidate (Clinical Staging) BRCA Mutation Status: Did Not Order Test AJCC T Category: cTX AJCC 8 Stage Grouping: IVB AJCC N Category: cNX AJCC M Category: cM1b Therapy Plan: Neoadjuvant Therapy followed by Surgery Intent of Therapy: Non-Curative / Palliative Intent, Discussed with Patient 

## 2020-01-08 NOTE — Telephone Encounter (Signed)
Thank you very much Heather for taking care of that!

## 2020-01-09 ENCOUNTER — Telehealth: Payer: Self-pay | Admitting: *Deleted

## 2020-01-09 ENCOUNTER — Encounter: Payer: Self-pay | Admitting: Pulmonary Disease

## 2020-01-09 ENCOUNTER — Ambulatory Visit: Payer: Medicare PPO | Admitting: Pulmonary Disease

## 2020-01-09 ENCOUNTER — Other Ambulatory Visit: Payer: Self-pay

## 2020-01-09 VITALS — BP 138/76 | HR 61 | Temp 97.7°F | Ht 64.0 in | Wt 128.0 lb

## 2020-01-09 DIAGNOSIS — I340 Nonrheumatic mitral (valve) insufficiency: Secondary | ICD-10-CM

## 2020-01-09 DIAGNOSIS — R0602 Shortness of breath: Secondary | ICD-10-CM

## 2020-01-09 DIAGNOSIS — C7961 Secondary malignant neoplasm of right ovary: Secondary | ICD-10-CM | POA: Diagnosis not present

## 2020-01-09 DIAGNOSIS — C7800 Secondary malignant neoplasm of unspecified lung: Secondary | ICD-10-CM

## 2020-01-09 DIAGNOSIS — I071 Rheumatic tricuspid insufficiency: Secondary | ICD-10-CM

## 2020-01-09 LAB — CA 125: Cancer Antigen (CA) 125: 1002 U/mL — ABNORMAL HIGH (ref 0.0–38.1)

## 2020-01-09 NOTE — Telephone Encounter (Signed)
Left msg with [place] to see if there are any sooner apts as patient will start chemotherapy on 8/17

## 2020-01-09 NOTE — Progress Notes (Signed)
Pharmacist Chemotherapy Monitoring - Initial Assessment    Anticipated start date: 01/16/20  Regimen:   Are orders appropriate based on the patients diagnosis, regimen, and cycle? Yes  Does the plan date match the patients scheduled date? Yes  Is the sequencing of drugs appropriate? Yes  Are the premedications appropriate for the patients regimen? Yes  Prior Authorization for treatment is: Pending o If applicable, is the correct biosimilar selected based on the patient's insurance? not applicable  Organ Function and Labs:  Are dose adjustments needed based on the patient's renal function, hepatic function, or hematologic function? No  Are appropriate labs ordered prior to the start of patient's treatment? Yes  Other organ system assessment, if indicated: N/A  The following baseline labs, if indicated, have been ordered: N/A  Dose Assessment:  Are the drug doses appropriate? Yes  Are the following correct: o Drug concentrations Yes o IV fluid compatible with drug Yes o Administration routes Yes o Timing of therapy Yes  If applicable, does the patient have documented access for treatment and/or plans for port-a-cath placement? yes  If applicable, have lifetime cumulative doses been properly documented and assessed? no Lifetime Dose Tracking  No doses have been documented on this patient for the following tracked chemicals: Doxorubicin, Epirubicin, Idarubicin, Daunorubicin, Mitoxantrone, Bleomycin, Oxaliplatin, Carboplatin, Liposomal Doxorubicin  o   Toxicity Monitoring/Prevention:  The patient has the following take home antiemetics prescribed: Ondansetron and Prochlorperazine  The patient has the following take home medications prescribed: N/A  Medication allergies and previous infusion related reactions, if applicable, have been reviewed and addressed. Yes  The patient's current medication list has been assessed for drug-drug interactions with their chemotherapy  regimen. no significant drug-drug interactions were identified on review.  Order Review:  Are the treatment plan orders signed? No  Is the patient scheduled to see a provider prior to their treatment? Yes  I verify that I have reviewed each item in the above checklist and answered each question accordingly.  Adelina Mings 01/09/2020 8:16 AM

## 2020-01-09 NOTE — Telephone Encounter (Signed)
Dr. [name], port can not be placed until 8/20. I am advocating to get this apt moved up. Are you ok with this port placement date. Chemo on 8/17

## 2020-01-09 NOTE — Telephone Encounter (Signed)
Would prefer she has the port before chemo, but I did tell we could get her through 1 round of carbo/taxol with the port if needed.

## 2020-01-09 NOTE — Telephone Encounter (Signed)
Port a cath scheduled on Fri 01/19/20 8:30a Arrive 7:30a

## 2020-01-09 NOTE — Patient Instructions (Signed)
We are going to check your oxygen level while you sleep.  We will see you in follow-up in 3 months time call sooner should any new problems arise.

## 2020-01-09 NOTE — Telephone Encounter (Signed)
Voicemail left with Sherri [name] to return call to discuss per conversation with Dr. [name].

## 2020-01-09 NOTE — Progress Notes (Signed)
Subjective:    Patient ID: Sherri [name], female    DOB: 02/11/1933, 86 y.o.   MRN: [id number]  HPI Sherri [name] is an 86 year old woman whom we have been evaluated for multiple lung nodules and mediastinal adenopathy.  The patient underwent bronchoscopy with navigational assistance and endobronchial ultrasound to evaluate this on 01 January 2020.  Recall that she has had a history of to be ER/PR positive upper inner quadrant right breast cancer.  She has had prior lumpectomy and radiation therapy in 2013 followed by Arimidex for 6 months.  She did not receive chemotherapy.  Prior to her procedure the patient also had PET/CT that showed her lung lesions and adenopathy to be consistent with malignancy she also had a RIGHT adnexal mass noted.  Since her procedure the patient has done well she had no difficulty with it.  She continues to be short of breath with exertion which has been an issue for her for the last several months.  Work-up for this revealed multiple lung nodules and mediastinal adenopathy as above.  Her CA 27-29 has been elevated, as CA-125 was sent after the procedure and is also highly elevated.  Patient was noted to have metastatic disease of gynecologic origin likely ovarian by pathology specimens obtained on 2 August.  Patient has not had any weight loss, anorexia, fevers, chills or sweats.  No chest pain.  With exception of her shortness of breath and right lower extremity edema she has no other complaint.  Review of Systems A 10 point review of systems was performed and it is as noted above otherwise negative.  Allergies  Allergen Reactions  . Prednisone Other (See Comments)    Mood alterations   Current Meds  Medication Sig  . acetaminophen (TYLENOL) 500 MG tablet Take 500 mg by mouth every 8 (eight) hours as needed for moderate pain.  Marland Kitchen aspirin EC 81 MG tablet Take 81 mg by mouth at bedtime. Swallow whole.  . carvedilol (COREG) 3.125 MG tablet Take 3.125 mg by mouth 2  (two) times daily.   . Cholecalciferol (VITAMIN D-3 PO) Take 2,000 mg by mouth at bedtime.   . Coenzyme Q10 (CO Q 10 PO) Take 100 mg by mouth daily.  Arna Medici 25 MCG tablet Take 25 mcg by mouth daily before breakfast. 30 minutes to 1 hour prior to eating  . lidocaine-prilocaine (EMLA) cream Apply to affected area once  . lisinopril (PRINIVIL,ZESTRIL) 10 MG tablet Take 10 mg by mouth in the morning and at bedtime.   . nitroGLYCERIN (NITROSTAT) 0.4 MG SL tablet Place 0.4 mg under the tongue every 5 (five) minutes x 3 doses as needed for chest pain.   Marland Kitchen ondansetron (ZOFRAN) 8 MG tablet Take 1 tablet (8 mg total) by mouth 2 (two) times daily as needed for refractory nausea / vomiting.  . prochlorperazine (COMPAZINE) 10 MG tablet Take 1 tablet (10 mg total) by mouth every 6 (six) hours as needed (Nausea or vomiting).  . simvastatin (ZOCOR) 40 MG tablet Take 40 mg by mouth at bedtime.   Immunization History  Administered Date(s) Administered  . Influenza,inj,Quad PF,6+ Mos 03/04/2018  . Influenza-Unspecified 04/06/2016, 03/08/2017, 02/23/2019  . PFIZER SARS-COV-2 Vaccination 06/09/2019, 06/30/2019  . Pneumococcal Polysaccharide-23 07/27/2017  . Tdap 01/24/2016       Objective:   Physical Exam BP 138/76 (BP Location: Left Arm, Cuff Size: Normal)   Pulse 61   Temp 97.7 F (36.5 C) (Temporal)   Ht 5\' 4"  (1.626 m)  Wt 128 lb (58.1 kg)   SpO2 95%   BMI 21.97 kg/m  Physical Exam  GENERAL: Thin well-developed female in no acute distress. She is very spry, alert, fully ambulatory and conversant.  Appears somewhat pale. HEAD: Normocephalic, atraumatic.  EYES: Pupils equal, round, reactive to light. No scleral icterus.  MOUTH: Nose/mouth/throat not examined due to masking requirements for COVID 19. NECK: Supple. No thyromegaly. Trachea midline. No JVD. No adenopathy. PULMONARY: Lungs clear to auscultation bilaterally. Good air entry bilaterally. CARDIOVASCULAR: S1 and S2. Regular rate  and rhythm. No overt rub, murmur or gallop heard. GASTROINTESTINAL: No abdominal distention MUSCULOSKELETAL: No joint deformity, no clubbing, mild/trace edema of the right lower extremity.  NEUROLOGIC: Awake, alert, no focal deficit noted. SKIN: Intact,warm,dry.  No rashes. PSYCH: Mood and behavior is appropriate  Ambulatory oximetry performed today: Desaturated transiently to 89% on room air but recovered very quickly.  No further desaturations noted.     Assessment & Plan:     ICD-10-CM   1. Metastatic adenocarcinoma of ovary, right (HCC)  C79.61    CA-125 is 1,002 U/mL Pathology from 2 August confirms Chemotherapy as directed by oncology  2. Malignant neoplasm metastatic to lung, unspecified laterality (HCC)  C78.00 Pulse oximetry, overnight   Innumerable lung nodules and mediastinal adenopathy, metastatic   3. SOB (shortness of breath)  R06.02    Related to overall issues as above Check overnight oximetry  4. Moderate mitral insufficiency  I34.0    This adds complexity to her management  5. Moderate tricuspid insufficiency  I07.1    This adds complexity to her management   Orders Placed This Encounter  Procedures  . Pulse oximetry, overnight    On roomair.    Standing Status:   Future    Standing Expiration Date:   01/08/2021   Discussion: Patient tolerated bronchoscopy under general anesthesia without sequela.  She is to embark in therapy for metastatic ovarian cancer.  Will obtain overnight oximetry due to marginal ambulatory oximetry results and ongoing issues with debilitating dyspnea, patient also has significant moderate tricuspid and mitral valve insufficiency which can be adding to the symptom.  She will need to be monitored very closely for anemia which may develop during therapy as this will of course, aggravate her symptoms of shortness of breath.  We will see the patient in follow-up in 3 months time she is to contact us prior to that time should any new  difficulties arise.   Renold Don, MD [facility]   *This note was dictated using voice recognition software/Dragon.  Despite best efforts to proofread, errors can occur which can change the meaning.  Any change was purely unintentional.

## 2020-01-10 ENCOUNTER — Telehealth: Payer: Self-pay

## 2020-01-10 ENCOUNTER — Inpatient Hospital Stay (HOSPITAL_BASED_OUTPATIENT_CLINIC_OR_DEPARTMENT_OTHER): Payer: Medicare PPO | Admitting: Obstetrics and Gynecology

## 2020-01-10 ENCOUNTER — Other Ambulatory Visit: Payer: Self-pay | Admitting: Physician Assistant

## 2020-01-10 ENCOUNTER — Other Ambulatory Visit: Payer: Self-pay

## 2020-01-10 VITALS — BP 179/73 | HR 58 | Temp 97.5°F | Wt 127.6 lb

## 2020-01-10 DIAGNOSIS — C561 Malignant neoplasm of right ovary: Secondary | ICD-10-CM | POA: Diagnosis not present

## 2020-01-10 DIAGNOSIS — M7989 Other specified soft tissue disorders: Secondary | ICD-10-CM

## 2020-01-10 DIAGNOSIS — Z5111 Encounter for antineoplastic chemotherapy: Secondary | ICD-10-CM | POA: Diagnosis not present

## 2020-01-10 NOTE — Telephone Encounter (Signed)
Spoke with patient in the clinic for her apts with Dr. Theora Gianotti. She was provided with the information about her port placement date/time/NPO 8 hours prior to the port placement. She explained that she is not sure she even wants chemotherapy or a port placement at this time. She will discuss her options with Dr. Theora Gianotti and the gyn team will let me know what the patient has decided.

## 2020-01-10 NOTE — Telephone Encounter (Signed)
Per Steffanie Dunn - pt elected to proceed with port placement and chemo.Sherri [name]

## 2020-01-10 NOTE — Patient Instructions (Signed)
It is common for patients with your type of disease to experience constipation.   If you experience constipation, please take stool softeners such as Senna 1-2 tablets 1-2 times a day and/or Miralax 1 capful in liquid 1-2 times a day every day to avoid constipation.  These medications are available over the counter.  Of course, if you have diarrhea, stop taking stool softeners.  Drinking plenty of fluid, eating fruits and vegetable, and being active also reduces the risk of constipation.   If despite taking stool softeners, and you still have no bowel movement for 2 days or more than your normal bowel habit frequency, please take one of the following over the counter laxatives:  Milk of Magnesia or Mag Citrate everyday and contact me immediately for further instructions.  The goal is to have at least one bowel movement every day or every other day without pain or straining.   If symptoms not improved or resolved or if you have other symptoms that are bothersome, please call [phone] to schedule an appointment in our [facility].

## 2020-01-10 NOTE — Patient Instructions (Signed)
Paclitaxel injection What is this medicine? PACLITAXEL (PAK li TAX el) is a chemotherapy drug. It targets fast dividing cells, like cancer cells, and causes these cells to die. This medicine is used to treat ovarian cancer, breast cancer, lung cancer, Kaposi's sarcoma, and other cancers. This medicine may be used for other purposes; ask your health care provider or pharmacist if you have questions. COMMON BRAND NAME(S): Onxol, Taxol What should I tell my health care provider before I take this medicine? They need to know if you have any of these conditions:  history of irregular heartbeat  liver disease  low blood counts, like low white cell, platelet, or red cell counts  lung or breathing disease, like asthma  tingling of the fingers or toes, or other nerve disorder  an unusual or allergic reaction to paclitaxel, alcohol, polyoxyethylated castor oil, other chemotherapy, other medicines, foods, dyes, or preservatives  pregnant or trying to get pregnant  breast-feeding How should I use this medicine? This drug is given as an infusion into a vein. It is administered in a hospital or clinic by a specially trained health care professional. Talk to your pediatrician regarding the use of this medicine in children. Special care may be needed. Overdosage: If you think you have taken too much of this medicine contact a poison control center or emergency room at once. NOTE: This medicine is only for you. Do not share this medicine with others. What if I miss a dose? It is important not to miss your dose. Call your doctor or health care professional if you are unable to keep an appointment. What may interact with this medicine? Do not take this medicine with any of the following medications:  disulfiram  metronidazole This medicine may also interact with the following medications:  antiviral medicines for hepatitis, HIV or AIDS  certain antibiotics like erythromycin and  clarithromycin  certain medicines for fungal infections like ketoconazole and itraconazole  certain medicines for seizures like carbamazepine, phenobarbital, phenytoin  gemfibrozil  nefazodone  rifampin  St. John's wort This list may not describe all possible interactions. Give your health care provider a list of all the medicines, herbs, non-prescription drugs, or dietary supplements you use. Also tell them if you smoke, drink alcohol, or use illegal drugs. Some items may interact with your medicine. What should I watch for while using this medicine? Your condition will be monitored carefully while you are receiving this medicine. You will need important blood work done while you are taking this medicine. This medicine can cause serious allergic reactions. To reduce your risk you will need to take other medicine(s) before treatment with this medicine. If you experience allergic reactions like skin rash, itching or hives, swelling of the face, lips, or tongue, tell your doctor or health care professional right away. In some cases, you may be given additional medicines to help with side effects. Follow all directions for their use. This drug may make you feel generally unwell. This is not uncommon, as chemotherapy can affect healthy cells as well as cancer cells. Report any side effects. Continue your course of treatment even though you feel ill unless your doctor tells you to stop. Call your doctor or health care professional for advice if you get a fever, chills or sore throat, or other symptoms of a cold or flu. Do not treat yourself. This drug decreases your body's ability to fight infections. Try to avoid being around people who are sick. This medicine may increase your risk to bruise   or bleed. Call your doctor or health care professional if you notice any unusual bleeding. Be careful brushing and flossing your teeth or using a toothpick because you may get an infection or bleed more easily.  If you have any dental work done, tell your dentist you are receiving this medicine. Avoid taking products that contain aspirin, acetaminophen, ibuprofen, naproxen, or ketoprofen unless instructed by your doctor. These medicines may hide a fever. Do not become pregnant while taking this medicine. Women should inform their doctor if they wish to become pregnant or think they might be pregnant. There is a potential for serious side effects to an unborn child. Talk to your health care professional or pharmacist for more information. Do not breast-feed an infant while taking this medicine. Men are advised not to father a child while receiving this medicine. This product may contain alcohol. Ask your pharmacist or healthcare provider if this medicine contains alcohol. Be sure to tell all healthcare providers you are taking this medicine. Certain medicines, like metronidazole and disulfiram, can cause an unpleasant reaction when taken with alcohol. The reaction includes flushing, headache, nausea, vomiting, sweating, and increased thirst. The reaction can last from 30 minutes to several hours. What side effects may I notice from receiving this medicine? Side effects that you should report to your doctor or health care professional as soon as possible:  allergic reactions like skin rash, itching or hives, swelling of the face, lips, or tongue  breathing problems  changes in vision  fast, irregular heartbeat  high or low blood pressure  mouth sores  pain, tingling, numbness in the hands or feet  signs of decreased platelets or bleeding - bruising, pinpoint red spots on the skin, black, tarry stools, blood in the urine  signs of decreased red blood cells - unusually weak or tired, feeling faint or lightheaded, falls  signs of infection - fever or chills, cough, sore throat, pain or difficulty passing urine  signs and symptoms of liver injury like dark yellow or brown urine; general ill feeling or  flu-like symptoms; light-colored stools; loss of appetite; nausea; right upper belly pain; unusually weak or tired; yellowing of the eyes or skin  swelling of the ankles, feet, hands  unusually slow heartbeat Side effects that usually do not require medical attention (report to your doctor or health care professional if they continue or are bothersome):  diarrhea  hair loss  loss of appetite  muscle or joint pain  nausea, vomiting  pain, redness, or irritation at site where injected  tiredness This list may not describe all possible side effects. Call your doctor for medical advice about side effects. You may report side effects to FDA at 1-800-FDA-1088. Where should I keep my medicine? This drug is given in a hospital or clinic and will not be stored at home. NOTE: This sheet is a summary. It may not cover all possible information. If you have questions about this medicine, talk to your doctor, pharmacist, or health care provider.  2020 [employer]/Gold Standard (2017-01-19 13:14:55) Carboplatin injection What is this medicine? CARBOPLATIN (KAR boe pla tin) is a chemotherapy drug. It targets fast dividing cells, like cancer cells, and causes these cells to die. This medicine is used to treat ovarian cancer and many other cancers. This medicine may be used for other purposes; ask your health care provider or pharmacist if you have questions. COMMON BRAND NAME(S): Paraplatin What should I tell my health care provider before I take this medicine? They need to   know if you have any of these conditions:  blood disorders  hearing problems  kidney disease  recent or ongoing radiation therapy  an unusual or allergic reaction to carboplatin, cisplatin, other chemotherapy, other medicines, foods, dyes, or preservatives  pregnant or trying to get pregnant  breast-feeding How should I use this medicine? This drug is usually given as an infusion into a vein. It is administered in a  hospital or clinic by a specially trained health care professional. Talk to your pediatrician regarding the use of this medicine in children. Special care may be needed. Overdosage: If you think you have taken too much of this medicine contact a poison control center or emergency room at once. NOTE: This medicine is only for you. Do not share this medicine with others. What if I miss a dose? It is important not to miss a dose. Call your doctor or health care professional if you are unable to keep an appointment. What may interact with this medicine?  medicines for seizures  medicines to increase blood counts like filgrastim, pegfilgrastim, sargramostim  some antibiotics like amikacin, gentamicin, neomycin, streptomycin, tobramycin  vaccines Talk to your doctor or health care professional before taking any of these medicines:  acetaminophen  aspirin  ibuprofen  ketoprofen  naproxen This list may not describe all possible interactions. Give your health care provider a list of all the medicines, herbs, non-prescription drugs, or dietary supplements you use. Also tell them if you smoke, drink alcohol, or use illegal drugs. Some items may interact with your medicine. What should I watch for while using this medicine? Your condition will be monitored carefully while you are receiving this medicine. You will need important blood work done while you are taking this medicine. This drug may make you feel generally unwell. This is not uncommon, as chemotherapy can affect healthy cells as well as cancer cells. Report any side effects. Continue your course of treatment even though you feel ill unless your doctor tells you to stop. In some cases, you may be given additional medicines to help with side effects. Follow all directions for their use. Call your doctor or health care professional for advice if you get a fever, chills or sore throat, or other symptoms of a cold or flu. Do not treat  yourself. This drug decreases your body's ability to fight infections. Try to avoid being around people who are sick. This medicine may increase your risk to bruise or bleed. Call your doctor or health care professional if you notice any unusual bleeding. Be careful brushing and flossing your teeth or using a toothpick because you may get an infection or bleed more easily. If you have any dental work done, tell your dentist you are receiving this medicine. Avoid taking products that contain aspirin, acetaminophen, ibuprofen, naproxen, or ketoprofen unless instructed by your doctor. These medicines may hide a fever. Do not become pregnant while taking this medicine. Women should inform their doctor if they wish to become pregnant or think they might be pregnant. There is a potential for serious side effects to an unborn child. Talk to your health care professional or pharmacist for more information. Do not breast-feed an infant while taking this medicine. What side effects may I notice from receiving this medicine? Side effects that you should report to your doctor or health care professional as soon as possible:  allergic reactions like skin rash, itching or hives, swelling of the face, lips, or tongue  signs of infection - fever or   chills, cough, sore throat, pain or difficulty passing urine  signs of decreased platelets or bleeding - bruising, pinpoint red spots on the skin, black, tarry stools, nosebleeds  signs of decreased red blood cells - unusually weak or tired, fainting spells, lightheadedness  breathing problems  changes in hearing  changes in vision  chest pain  high blood pressure  low blood counts - This drug may decrease the number of white blood cells, red blood cells and platelets. You may be at increased risk for infections and bleeding.  nausea and vomiting  pain, swelling, redness or irritation at the injection site  pain, tingling, numbness in the hands or  feet  problems with balance, talking, walking  trouble passing urine or change in the amount of urine Side effects that usually do not require medical attention (report to your doctor or health care professional if they continue or are bothersome):  hair loss  loss of appetite  metallic taste in the mouth or changes in taste This list may not describe all possible side effects. Call your doctor for medical advice about side effects. You may report side effects to FDA at 1-800-FDA-1088. Where should I keep my medicine? This drug is given in a hospital or clinic and will not be stored at home. NOTE: This sheet is a summary. It may not cover all possible information. If you have questions about this medicine, talk to your doctor, pharmacist, or health care provider.  2020 [employer]/Gold Standard (2007-08-23 14:38:05)  

## 2020-01-10 NOTE — Telephone Encounter (Signed)
Thank you :)

## 2020-01-10 NOTE — Telephone Encounter (Signed)
Port can be placed on Friday 8/13- arrival time at 9:30 am  I left a voice message for the patient. Unable to reach patient's family listed on DPR. Unable to leave vm for those individuals. I also tried to reach patient on her cell phone which is listed on her dpr. Will attempt to reach patient again this afternoon.

## 2020-01-10 NOTE — Progress Notes (Signed)
Sherri [name]  [phone] [phone]  Gynecologic Oncology Consult Note  ID: [name] OB: 1932-12-13  MR#: [id number]  WNI#:[id number]  Patient Care Team: [name], MD as PCP - General (Family Medicine) Lloyd Huger, MD as Consulting Physician (Oncology) Clent Jacks, RN as Oncology Nurse Navigator  CHIEF COMPLAINT: Stage IV ovarian cancer.  INTERVAL HISTORY: Ms. Sherri [name] is a pleasant patient with h/o breast cancer and recent diagnosis of stage IV ovarian cancer referred by Dr. [name] for second opionion. Her history is as follows:   She has a history of stage IIb ER/PR positive upper inner quadrant right breast cancer status post lumpectomy and radiation therapy in 2013. The patient received Arimidex 6 months prior to her lumpectomy and adjuvant XRT afterwards. She did not receive chemotherapy. Patient completed 5 years of Arimidex in October 2018. Most recent mammogram was in August 2020 at that time no evidence of recurrence was noted.   Over the last 1 to 2 months as noted increased shortness of breath where she could hardly walk due to the sensation of breathlessness and right lower extremity edema. She underwent evaluation by [facility] including the following:    11/16/2019 Chest CT  IMPRESSION: 1. Widespread perilymphatic pulmonary parenchymal and mediastinal/hilar adenopathy are indicative of metastatic disease. Question breast primary. 2. Suspect underlying interstitial lung disease, poorly characterized given extensive nodularity and lack of high-resolution imaging. 3. Ascending Aortic aneurysm NOS (ICD10-I71.9). Recommend annual imaging followup by CTA or MRA. This recommendation follows 2010 ACCF/AHA/AATS/ACR/ASA/SCA/SCAI/SIR/STS/SVM Guidelines for the Diagnosis and Management of Patients with Thoracic Aortic Disease. Circulation. 2010; 121: F818-E993. Aortic aneurysm NOS (ICD10-I71.9). 4.  Calcified splenic artery aneurysm. 5. Aortic atherosclerosis (ICD10-I70.0). Coronary artery calcification. 6. Enlarged pulmonic trunk, indicative of pulmonary arterial hypertension.  11/13/2019 Ultrasound RLE IMPRESSION: No evidence of deep venous thrombosis of the right lower extremity. Nonspecific subcutaneous edema of the right calf.  11/30/2019 Ultrasound bilateral LE Duplex IMPRESSION: Nonocclusive mild peripheral atherosclerosis bilaterally with biphasic waveforms throughout both lower extremities without significant velocity abnormality.  12/11/2019 PET IMPRESSION: 1. Diffuse pulmonary metastatic disease and extensive mediastinal and hilar lymphadenopathy. The primary lesion is most likely in the right pelvis and could be ovarian or uterine in origin. Associated extensive retroperitoneal lymphadenopathy. 2. The right ureter is obstructed by the right pelvic mass with chronic right-sided hydroureteronephrosis and marked parenchymal thinning of the right kidney. 3. No findings suspicious for osseous metastatic disease.  11/28/2019 CA 27-29 322.7  01/01/2020 Virtual Bronchoscopy with Multi-planar Image analysis, 3-D reconstruction of coronal, sagittal and multi-planar images for the purposes of planning real-time bronchoscopy using the i[employer] Electromagnetic Navigation Bronchoscopy System (superDimension).  Endobronchial ultrasound with TBNA.  Pathology: DIAGNOSIS:  A. LUNG, LEFT LOWER LOBE; BIOPSY: - POORLY DIFFERENTIATED CARCINOMA; SEE COMMENT.   Comment:  The tumor cells are positive for Pax8 and ER (patchy weak). They are  negative for GATA3, TTF1, and p40. These features (along with the  clinical and imaging findings) are consistent with a metastatic  carcinoma of gynecologic origin. Clinical correlation is recommended.   01/08/2020 CA125 1,002  Dr. [name] recommended chemotherapy with paclitaxel and carboplatin.   PET scan showed right hydroureteronephrosis upstream to the  mass with almost complete right renal atrophy and minimal remaining parenchyma. Kidney function has been stable with egfr of 30-39.   She saw Dr. Diamantina Providence who did not feel there was a role for ureteral stent or nephrostomy tube and did not feel likely to have improvement in her renal  function given that the right kidney was nearly completely atrophic with almost no remaining parenchyma.   She presents for today for second opinion.   PAST MEDICAL HISTORY: Past Medical History:  Diagnosis Date  . Anxiety   . Arthritis   . Breast cancer ([place]) 2013   RIGHT lumpectomy  . Bursitis of elbow    RIGHT  . Cancer [facility]) 2013   right breast lumpectomy with a few nodes removed  . Chronic kidney disease    stage 3  . Colitis   . Coronary artery disease   . Diverticulitis   . Dysrhythmia    according to anxiety, tachy  . GERD (gastroesophageal reflux disease)    okay since took her gallbladder out  . H/O lumpectomy   . Heart disease   . History of coronary artery stent placement 2007   one stent  . HOH (hard of hearing)   . Hypercholesteremia   . Hypertension   . Hypothyroidism   . Myocardial infarction ([place])    2007  . Osteopenia   . Palpitations   . Personal history of radiation therapy   . Skin cancer     PAST SURGICAL HISTORY: Past Surgical History:  Procedure Laterality Date  . BREAST LUMPECTOMY Right 2013   lymph node dissection  . CATARACT EXTRACTION W/PHACO Right 07/23/2015   Procedure: CATARACT EXTRACTION PHACO AND INTRAOCULAR LENS PLACEMENT (IOC);  Surgeon: Birder Robson, MD;  Location: [facility] ORS;  Service: Ophthalmology;  Laterality: Right;  [place] 01:24 AP% 26.7 CDE 22.47 fluid pack lot # [id number]  . CATARACT EXTRACTION W/PHACO Left 08/13/2015   Procedure: CATARACT EXTRACTION PHACO AND INTRAOCULAR LENS PLACEMENT (IOC);  Surgeon: Birder Robson, MD;  Location: [facility] ORS;  Service: Ophthalmology;  Laterality: Left;  [place] 02:40 AP% 30.7 CDE 49.28 fluid pack lot # [id number]   . CHOLECYSTECTOMY    . COLONOSCOPY    . CORONARY ANGIOPLASTY  2007  . EYE SURGERY     bilateral cataract  . HARDWARE REMOVAL Right 04/29/2016   Procedure: HARDWARE REMOVAL;  Surgeon: Hessie Knows, MD;  Location: [facility] ORS;  Service: Orthopedics;  Laterality: Right;  . ORIF ELBOW FRACTURE Right 10/01/2015   Procedure: OPEN REDUCTION INTERNAL FIXATION (ORIF) ELBOW/OLECRANON FRACTURE;  Surgeon: Hessie Knows, MD;  Location: [facility] ORS;  Service: Orthopedics;  Laterality: Right;  Marland Kitchen VIDEO BRONCHOSCOPY WITH ENDOBRONCHIAL NAVIGATION N/A 01/01/2020   Procedure: VIDEO BRONCHOSCOPY WITH ENDOBRONCHIAL NAVIGATION WITH CELLZIZIO;  Surgeon: Tyler Pita, MD;  Location: [facility] ORS;  Service: Pulmonary;  Laterality: N/A;  . VIDEO BRONCHOSCOPY WITH ENDOBRONCHIAL ULTRASOUND N/A 01/01/2020   Procedure: VIDEO BRONCHOSCOPY WITH ENDOBRONCHIAL ULTRASOUND;  Surgeon: Tyler Pita, MD;  Location: [facility] ORS;  Service: Pulmonary;  Laterality: N/A;    HEALTH MAINTENANCE: Social History   Tobacco Use  . Smoking status: Former Smoker    Packs/day: 0.25    Years: 2.00    Pack years: 0.50    Types: Cigarettes    Quit date: 12/12/1955    Years since quitting: 64.1  . Smokeless tobacco: Never Used  Vaping Use  . Vaping Use: Never used  Substance Use Topics  . Alcohol use: Yes    Comment: wine occasionally  . Drug use: No    Allergies  Allergen Reactions  . Prednisone Other (See Comments)    Mood alterations    Current Outpatient Medications  Medication Sig Dispense Refill  . acetaminophen (TYLENOL) 500 MG tablet Take 500 mg by mouth every 8 (eight) hours as needed for moderate  pain.    . aspirin EC 81 MG tablet Take 81 mg by mouth at bedtime. Swallow whole.    . carvedilol (COREG) 3.125 MG tablet Take 3.125 mg by mouth 2 (two) times daily.     . Cholecalciferol (VITAMIN D-3 PO) Take 2,000 mg by mouth at bedtime.     . Coenzyme Q10 (CO Q 10 PO) Take 100 mg by mouth daily.    Arna Medici 25 MCG tablet Take  25 mcg by mouth daily before breakfast. 30 minutes to 1 hour prior to eating    . lidocaine-prilocaine (EMLA) cream Apply to affected area once 30 g 3  . lisinopril (PRINIVIL,ZESTRIL) 10 MG tablet Take 10 mg by mouth in the morning and at bedtime.     . nitroGLYCERIN (NITROSTAT) 0.4 MG SL tablet Place 0.4 mg under the tongue every 5 (five) minutes x 3 doses as needed for chest pain.     Marland Kitchen ondansetron (ZOFRAN) 8 MG tablet Take 1 tablet (8 mg total) by mouth 2 (two) times daily as needed for refractory nausea / vomiting. 60 tablet 2  . prochlorperazine (COMPAZINE) 10 MG tablet Take 1 tablet (10 mg total) by mouth every 6 (six) hours as needed (Nausea or vomiting). 60 tablet 2  . simvastatin (ZOCOR) 40 MG tablet Take 40 mg by mouth at bedtime.     No current facility-administered medications for this visit.    OBJECTIVE: Vitals:   01/10/20 1437 01/10/20 1438  BP: (!) 175/75 (!) 179/73  Pulse:    Temp:    SpO2:       Body mass index is 21.9 kg/m.      ECOG FS: 2 - Symptomatic, <50% out of bed or chair. Watches TV. Able to dress herself and make her own meals. Her brother makes dinner.    Repeat BP 170/73  GENERAL: Patient is a well appearing female in no acute distress HEENT:  PERRL, neck supple with midline trachea. Thyroid without masses.  NODES:  No cervical, supraclavicular, axillary, or inguinal lymphadenopathy palpated.  LUNGS:  Clear to auscultation bilaterally.  Positive wheezes bilaterally.  HEART:  Regular rate and rhythm.  ABDOMEN:  Soft, nontender, slightly distended with possible ascites but no apparent fluid wave. Mass in the RLQ palpable. No hernias or obvious splenomegaly or hepatomegaly.  EXTREMITIES:  Positive swelling RLE, 1-2+ edema, which is larger than the left. No peripheral edema on left.  SKIN:  Clear with no obvious rashes or skin changes.  NEURO:  Nonfocal. Well oriented.  Appropriate affect.  Pelvic: Chaperoned by NP EGBUS: no lesions Cervix: very firm to  palpation Vagina:  Lesions right fornix and posterior vagina that is very firm to palpation. No active bleeding Uterus: normal size, nontender but seems adherent with the mass.  BME:  palpable mass RLQ with extension to the right vaginal wall and to the side wall with limited mobility. The cervix is very firm on palpation and seems infiltrated with tumor.  Rectovaginal: confirmatory there may be a mass compressing on the rectosigmoid about the level of the exam.    LAB RESULTS:  Lab Results  Component Value Date   NA 132 (L) 11/28/2019   K 4.4 12/13/2019   CL 98 11/28/2019   CO2 26 11/28/2019   GLUCOSE 84 11/28/2019   BUN 32 (H) 11/28/2019   CREATININE 1.57 (H) 11/28/2019   CALCIUM 9.3 11/28/2019   PROT 8.2 (H) 11/28/2019   ALBUMIN 4.2 11/28/2019   AST 21 11/28/2019  ALT 11 11/28/2019   ALKPHOS 54 11/28/2019   BILITOT 0.6 11/28/2019   GFRNONAA 30 (L) 11/28/2019   GFRAA 35 (L) 11/28/2019    Lab Results  Component Value Date   WBC 12.4 (H) 11/28/2019   NEUTROABS 7.3 11/28/2019   HGB 12.6 11/28/2019   HCT 38.9 11/28/2019   MCV 92.6 11/28/2019   PLT 235 11/28/2019     STUDIES: As per HPI  ASSESSMENT: Stage IV ovarian cancer with widely metastatic disease. Right hydronephrosis with atrophic kidney. RLE swelling most likely due to mass compression on iliac vessels versus iliac vessel clot.    PLAN:    Stage IV ovarian cancer: I agree with recommendations for paclitaxel and carboplatin neoadjuvant chemotherapy. Surgery is not an option. Based on exam I suspect she has unresectable disease and she also has medical comorbid conditions that increase her risk of surgery. She asked about radiation and that may be needed for palliation but I do not recommend that approach at this time. She may have an excellent response and disease control. The typical response rate is 75-80% and she may have excellent disease control. She has not previously received chemotherapy. She did receive  radiation which may slightly increase her risk of pancytopenia. We discussed that without chemotherapy her survival may be as low as 3 months given the extent of her cancer. I am also worried that she may have an impending bowel obstruction. We discussed that if she should develop an obstruction sometimes a G-tube, stent, or surgery is needed. She was advised to use stool softeners and reach out to Dr. Gary Fleet team for symptom management. We will provide some preliminary recommendations today.   With regard to dosing she is frail with a performance status of 2. I would use lower doses of chemotherapy ie paclitaxel 110 mg/m2 and carboplatin AUC=4 or 5. If she tolerates the first dose well then the dose could be increased. If she has difficulty with that dosing schedule the paclitaxel can be omitted or use paclitaxel 60 mg/m2 weekly dosing (on 3 off 1) with carboplatin dosed q 4 weeks.   Agree with genetic testing and also recommended somatic testing of the tumor for BRCA1/2 and HRD status. If either are positive she may be a candidate for PARP inhibitor for maintenance therapy.   We will check with radiology about compression on the iliac vessels and if there is any evidence of a clot in the pelvic vessels. She may need further imaging (MRV, MRA).   Khorana Score Calculation  Point value Risk factors  1 point each Gynecologic malignancy and Leukocyte count > 11   *1 point = no thromboprophylaxis indicated  *2+ points = recommend apixaban 2.5 mg BID or prophylactic LMWH I will discuss with Dr. [name]. She is on aspirin but given the score she may benefit from further preventive therapy.   I personally had a face to face interaction and evaluated the patient jointly with the NP, Ms. Beckey Rutter.  I have reviewed her history and available records and have performed the key portions of the physical exam including lymph node survey, abdominal exam, pelvic exam with my findings confirming those  documented above me.  I have discussed the case with the APP and the patient.  I agree with the above documentation, assessment and plan which was fully formulated by me.  Counseling was completed by me.   I personally saw the patient and performed a substantive portion of this encounter in conjunction with the listed  APP as documented above.   Rily Nickey Gaetana Michaelis, MD   01/10/2020 2:45 PM

## 2020-01-10 NOTE — Telephone Encounter (Signed)
Called and was able to speak with Ms. Ken. Explained that we have gyn oncology services here from [place] every Wednesday. She is interested in meeting with them. Appointment arranged for today at 1330.

## 2020-01-10 NOTE — Progress Notes (Signed)
Patient would like to discuss chemo treatment. She has concerns with side effects and if there are other chemo treatments she could do beside infusions. She denies no other concerns at this time.

## 2020-01-11 ENCOUNTER — Inpatient Hospital Stay: Payer: Medicare PPO

## 2020-01-11 ENCOUNTER — Other Ambulatory Visit: Payer: Medicare PPO

## 2020-01-11 ENCOUNTER — Inpatient Hospital Stay (HOSPITAL_BASED_OUTPATIENT_CLINIC_OR_DEPARTMENT_OTHER): Payer: Medicare PPO | Admitting: Nurse Practitioner

## 2020-01-11 DIAGNOSIS — C561 Malignant neoplasm of right ovary: Secondary | ICD-10-CM | POA: Diagnosis not present

## 2020-01-11 DIAGNOSIS — C569 Malignant neoplasm of unspecified ovary: Secondary | ICD-10-CM

## 2020-01-11 DIAGNOSIS — Z5111 Encounter for antineoplastic chemotherapy: Secondary | ICD-10-CM | POA: Diagnosis not present

## 2020-01-11 LAB — COMPREHENSIVE METABOLIC PANEL
ALT: 8 U/L (ref 0–44)
AST: 18 U/L (ref 15–41)
Albumin: 3.8 g/dL (ref 3.5–5.0)
Alkaline Phosphatase: 45 U/L (ref 38–126)
Anion gap: 8 (ref 5–15)
BUN: 21 mg/dL (ref 8–23)
CO2: 25 mmol/L (ref 22–32)
Calcium: 8.8 mg/dL — ABNORMAL LOW (ref 8.9–10.3)
Chloride: 105 mmol/L (ref 98–111)
Creatinine, Ser: 1.48 mg/dL — ABNORMAL HIGH (ref 0.44–1.00)
GFR calc Af Amer: 37 mL/min — ABNORMAL LOW (ref >60)
GFR calc non Af Amer: 32 mL/min — ABNORMAL LOW (ref >60)
Glucose, Bld: 149 mg/dL — ABNORMAL HIGH (ref 70–99)
Potassium: 4.2 mmol/L (ref 3.5–5.1)
Sodium: 138 mmol/L (ref 135–145)
Total Bilirubin: 0.6 mg/dL (ref 0.3–1.2)
Total Protein: 7.1 g/dL (ref 6.5–8.1)

## 2020-01-11 LAB — CBC WITH DIFFERENTIAL/PLATELET
Abs Immature Granulocytes: 0.04 10*3/uL (ref 0.00–0.07)
Basophils Absolute: 0.1 10*3/uL (ref 0.0–0.1)
Basophils Relative: 1 %
Eosinophils Absolute: 0.5 10*3/uL (ref 0.0–0.5)
Eosinophils Relative: 6 %
HCT: 34.6 % — ABNORMAL LOW (ref 36.0–46.0)
Hemoglobin: 11.4 g/dL — ABNORMAL LOW (ref 12.0–15.0)
Immature Granulocytes: 1 %
Lymphocytes Relative: 24 %
Lymphs Abs: 2.1 10*3/uL (ref 0.7–4.0)
MCH: 30.5 pg (ref 26.0–34.0)
MCHC: 32.9 g/dL (ref 30.0–36.0)
MCV: 92.5 fL (ref 80.0–100.0)
Monocytes Absolute: 0.9 10*3/uL (ref 0.1–1.0)
Monocytes Relative: 10 %
Neutro Abs: 5.2 10*3/uL (ref 1.7–7.7)
Neutrophils Relative %: 58 %
Platelets: 209 10*3/uL (ref 150–400)
RBC: 3.74 MIL/uL — ABNORMAL LOW (ref 3.87–5.11)
RDW: 14.6 % (ref 11.5–15.5)
WBC: 8.8 10*3/uL (ref 4.0–10.5)
nRBC: 0 % (ref 0.0–0.2)

## 2020-01-11 NOTE — Progress Notes (Signed)
[place]  [phone] [phone]  Patient Care Team: [name], MD as PCP - General (Family Medicine) Lloyd Huger, MD as Consulting Physician (Oncology) Clent Jacks, RN as Oncology Nurse Navigator   Name of the patient: Sherri [name]  [id number]  06/09/1932   Date of visit: 01/11/20  Diagnosis- Metastatic Ovarian Cancer  Chief complaint/Reason for visit- Initial Meeting for [facility], preparing for starting chemotherapy  Heme/Onc history:  Oncology History  Ovarian cancer ([place])  01/05/2020 Initial Diagnosis   Ovarian cancer ([place])   01/16/2020 -  Chemotherapy   The patient had palonosetron (ALOXI) injection 0.25 mg, 0.25 mg, Intravenous,  Once, 0 of 6 cycles CARBOplatin (PARAPLATIN) in sodium chloride 0.9 % 100 mL chemo infusion,  (original dose ), Intravenous,  Once, 0 of 6 cycles Dose modification:   (Cycle 1) fosaprepitant (EMEND) 150 mg in sodium chloride 0.9 % 145 mL IVPB, 150 mg, Intravenous,  Once, 0 of 6 cycles PACLitaxel (TAXOL) 288 mg in sodium chloride 0.9 % 250 mL chemo infusion (> 80mg /m2), 175 mg/m2, Intravenous,  Once, 0 of 6 cycles  for chemotherapy treatment.      Interval history- who presents to chemo care clinic today for initial meeting in preparation for starting chemotherapy. I introduced the chemo care clinic and we discussed that the role of the clinic is to assist those who are at an increased risk of emergency room visits and/or complications during the course of chemotherapy treatment. We discussed that the increased risk takes into account factors such as age, performance status, and co-morbidities. We also discussed that for some, this might include barriers to care such as not having a primary care provider, lack of insurance/transportation, or not being able to afford medications. We discussed that the goal of the program is to help prevent  unplanned ER visits and help reduce complications during chemotherapy. We do this by discussing specific risk factors to each individual and identifying ways that we can help improve these risk factors and reduce barriers to care.   ECOG FS:3 - Symptomatic, >50% confined to bed  Review of systems- Review of Systems  Constitutional: Positive for malaise/fatigue. Negative for chills, fever and weight loss.  HENT: Negative for hearing loss, nosebleeds, sore throat and tinnitus.   Eyes: Negative for blurred vision and double vision.  Respiratory: Negative for cough, hemoptysis, shortness of breath and wheezing.   Cardiovascular: Negative for chest pain, palpitations and leg swelling.  Gastrointestinal: Negative for abdominal pain, blood in stool, constipation, diarrhea, melena, nausea and vomiting.       Abdominal bloating & distention  Genitourinary: Negative for dysuria and urgency.  Musculoskeletal: Negative for back pain, falls, joint pain and myalgias.  Skin: Negative for itching and rash.  Neurological: Negative for dizziness, tingling, sensory change, loss of consciousness, weakness and headaches.  Endo/Heme/Allergies: Negative for environmental allergies. Does not bruise/bleed easily.  Psychiatric/Behavioral: Negative for depression. The patient is not nervous/anxious and does not have insomnia.     Planned Treatment: Neoadjuvant carbo-taxol chemotherapy  Allergies  Allergen Reactions  . Prednisone Other (See Comments)    Mood alterations    Past Medical History:  Diagnosis Date  . Anxiety   . Arthritis   . Breast cancer ([place]) 2013   RIGHT lumpectomy  . Bursitis of elbow    RIGHT  . Cancer [facility]) 2013   right breast lumpectomy with a few nodes removed  . Chronic kidney disease  stage 3  . Colitis   . Coronary artery disease   . Diverticulitis   . Dysrhythmia    according to anxiety, tachy  . GERD (gastroesophageal reflux disease)    okay since took her gallbladder  out  . H/O lumpectomy   . Heart disease   . History of coronary artery stent placement 2007   one stent  . HOH (hard of hearing)   . Hypercholesteremia   . Hypertension   . Hypothyroidism   . Myocardial infarction ([place])    2007  . Osteopenia   . Palpitations   . Personal history of radiation therapy   . Skin cancer     Past Surgical History:  Procedure Laterality Date  . BREAST LUMPECTOMY Right 2013   lymph node dissection  . CATARACT EXTRACTION W/PHACO Right 07/23/2015   Procedure: CATARACT EXTRACTION PHACO AND INTRAOCULAR LENS PLACEMENT (IOC);  Surgeon: Birder Robson, MD;  Location: [facility] ORS;  Service: Ophthalmology;  Laterality: Right;  [place] 01:24 AP% 26.7 CDE 22.47 fluid pack lot # [id number]  . CATARACT EXTRACTION W/PHACO Left 08/13/2015   Procedure: CATARACT EXTRACTION PHACO AND INTRAOCULAR LENS PLACEMENT (IOC);  Surgeon: Birder Robson, MD;  Location: [facility] ORS;  Service: Ophthalmology;  Laterality: Left;  [place] 02:40 AP% 30.7 CDE 49.28 fluid pack lot # [id number]  . CHOLECYSTECTOMY    . COLONOSCOPY    . CORONARY ANGIOPLASTY  2007  . EYE SURGERY     bilateral cataract  . HARDWARE REMOVAL Right 04/29/2016   Procedure: HARDWARE REMOVAL;  Surgeon: Hessie Knows, MD;  Location: [facility] ORS;  Service: Orthopedics;  Laterality: Right;  . ORIF ELBOW FRACTURE Right 10/01/2015   Procedure: OPEN REDUCTION INTERNAL FIXATION (ORIF) ELBOW/OLECRANON FRACTURE;  Surgeon: Hessie Knows, MD;  Location: [facility] ORS;  Service: Orthopedics;  Laterality: Right;  Marland Kitchen VIDEO BRONCHOSCOPY WITH ENDOBRONCHIAL NAVIGATION N/A 01/01/2020   Procedure: VIDEO BRONCHOSCOPY WITH ENDOBRONCHIAL NAVIGATION WITH CELLZIZIO;  Surgeon: Tyler Pita, MD;  Location: [facility] ORS;  Service: Pulmonary;  Laterality: N/A;  . VIDEO BRONCHOSCOPY WITH ENDOBRONCHIAL ULTRASOUND N/A 01/01/2020   Procedure: VIDEO BRONCHOSCOPY WITH ENDOBRONCHIAL ULTRASOUND;  Surgeon: Tyler Pita, MD;  Location: [facility] ORS;  Service: Pulmonary;   Laterality: N/A;    Social History   Socioeconomic History  . Marital status: Divorced    Spouse name: Not on file  . Number of children: Not on file  . Years of education: Not on file  . Highest education level: Not on file  Occupational History  . Not on file  Tobacco Use  . Smoking status: Former Smoker    Packs/day: 0.25    Years: 2.00    Pack years: 0.50    Types: Cigarettes    Quit date: 12/12/1955    Years since quitting: 64.1  . Smokeless tobacco: Never Used  Vaping Use  . Vaping Use: Never used  Substance and Sexual Activity  . Alcohol use: Yes    Comment: wine occasionally  . Drug use: No  . Sexual activity: Not on file  Other Topics Concern  . Not on file  Social History Narrative  . Not on file   Social Determinants of Health   Financial Resource Strain:   . Difficulty of Paying Living Expenses:   Food Insecurity:   . Worried About Charity fundraiser in the Last Year:   . Arboriculturist in the Last Year:   Transportation Needs:   . Film/video editor (Medical):   Marland Kitchen Lack of Transportation (  Non-Medical):   Physical Activity:   . Days of Exercise per Week:   . Minutes of Exercise per Session:   Stress:   . Feeling of Stress :   Social Connections:   . Frequency of Communication with Friends and Family:   . Frequency of Social Gatherings with Friends and Family:   . Attends Religious Services:   . Active Member of Clubs or Organizations:   . Attends Archivist Meetings:   Marland Kitchen Marital Status:   Intimate Partner Violence:   . Fear of Current or Ex-Partner:   . Emotionally Abused:   Marland Kitchen Physically Abused:   . Sexually Abused:     Family History  Problem Relation Age of Onset  . Breast cancer Sister 54     Current Outpatient Medications:  .  acetaminophen (TYLENOL) 500 MG tablet, Take 500 mg by mouth every 8 (eight) hours as needed for moderate pain., Disp: , Rfl:  .  aspirin EC 81 MG tablet, Take 81 mg by mouth at bedtime. Swallow  whole., Disp: , Rfl:  .  carvedilol (COREG) 3.125 MG tablet, Take 3.125 mg by mouth 2 (two) times daily. , Disp: , Rfl:  .  Cholecalciferol (VITAMIN D-3 PO), Take 2,000 mg by mouth at bedtime. , Disp: , Rfl:  .  Coenzyme Q10 (CO Q 10 PO), Take 100 mg by mouth daily., Disp: , Rfl:  .  EUTHYROX 25 MCG tablet, Take 25 mcg by mouth daily before breakfast. 30 minutes to 1 hour prior to eating, Disp: , Rfl:  .  lidocaine-prilocaine (EMLA) cream, Apply to affected area once, Disp: 30 g, Rfl: 3 .  lisinopril (PRINIVIL,ZESTRIL) 10 MG tablet, Take 10 mg by mouth in the morning and at bedtime. , Disp: , Rfl:  .  nitroGLYCERIN (NITROSTAT) 0.4 MG SL tablet, Place 0.4 mg under the tongue every 5 (five) minutes x 3 doses as needed for chest pain. , Disp: , Rfl:  .  ondansetron (ZOFRAN) 8 MG tablet, Take 1 tablet (8 mg total) by mouth 2 (two) times daily as needed for refractory nausea / vomiting., Disp: 60 tablet, Rfl: 2 .  prochlorperazine (COMPAZINE) 10 MG tablet, Take 1 tablet (10 mg total) by mouth every 6 (six) hours as needed (Nausea or vomiting)., Disp: 60 tablet, Rfl: 2 .  simvastatin (ZOCOR) 40 MG tablet, Take 40 mg by mouth at bedtime., Disp: , Rfl:   Physical exam: There were no vitals filed for this visit. Physical Exam Constitutional:      General: She is not in acute distress.    Comments: Accompanied by her daughter  Abdominal:     Tenderness: There is no abdominal tenderness.  Skin:    General: Skin is warm and dry.  Neurological:     Mental Status: She is alert.  Psychiatric:        Mood and Affect: Mood normal.        Behavior: Behavior normal.      CMP Latest Ref Rng & Units 01/11/2020  Glucose 70 - 99 mg/dL 149(H)  BUN 8 - 23 mg/dL 21  Creatinine 0.44 - 1.00 mg/dL 1.48(H)  Sodium 135 - 145 mmol/L 138  Potassium 3.5 - 5.1 mmol/L 4.2  Chloride 98 - 111 mmol/L 105  CO2 22 - 32 mmol/L 25  Calcium 8.9 - 10.3 mg/dL 8.8(L)  Total Protein 6.5 - 8.1 g/dL 7.1  Total Bilirubin 0.3 -  1.2 mg/dL 0.6  Alkaline Phos 38 - 126 U/L 45  AST 15 - 41 U/L 18  ALT 0 - 44 U/L 8   CBC Latest Ref Rng & Units 01/11/2020  WBC 4.0 - 10.5 K/uL 8.8  Hemoglobin 12.0 - 15.0 g/dL 11.4(L)  Hematocrit 36 - 46 % 34.6(L)  Platelets 150 - 400 K/uL 209    No images are attached to the encounter.  DG Chest Port 1 View  Result Date: 01/01/2020 CLINICAL DATA:  Status post biopsy. EXAM: PORTABLE CHEST 1 VIEW COMPARISON:  November 16, 2019. FINDINGS: The heart size and mediastinal contours are within normal limits. No pneumothorax or pleural effusion is noted. Multiple irregular nodular densities in reticular densities are noted throughout both lungs consistent with metastatic disease as described on prior CT scan. The visualized skeletal structures are unremarkable. IMPRESSION: Multiple irregular nodular densities and reticular densities are noted throughout both lungs consistent with metastatic disease as described on prior CT scan. Electronically Signed   By: Marijo Conception M.D.   On: 01/01/2020 15:43   DG C-Arm 1-60 Min-No Report  Result Date: 01/01/2020 Fluoroscopy was utilized by the requesting physician.  No radiographic interpretation.     Assessment and plan- Patient is a 86 y.o. female who presents to [facility] for initial meeting in preparation for starting chemotherapy for the treatment of metastatic ovarian cancer   1. Stage IV ovarian cancer with widely metastatic disease-right hydronephrosis with atrophic kidney.  Right lower extremity swelling likely secondary to compression of the iliac vessels.  We will plan to have imaging reviewed at tumor board to evaluate for possible iliac vessel clot.  Negative ultrasound.  Mass compressing on rectosigmoid colon at level of exam. Discussed importance of bowel prophylaxis and if constipation or change in bms concern for obstruction. She expressed importance of quality of life and is concerned of taking chemotherapy but would like to try. Plan  for dose reduced carboplatin was discussed with plan to increase if tolerating. Given history of breast cancer she has been referred to genetics for germline and somatic testing for possible parp maintenance. Not currently experiencing pain.  She will have a port placed for administration of chemotherapy on 8/13.   2. Chemo Care Clinic/High Risk for ER/Hospitalization during chemotherapy- We discussed the role of the chemo care clinic and identified patient specific risk factors. I discussed that patient was identified as high risk primarily based on multiple risk factors as well as advanced disease.   3. Social Determinants of Health- we discussed that social determinants of health may have significant impacts on health and outcomes for cancer patients.  Today we discussed specific social determinants of performance status, alcohol use, depression, financial needs, food insecurity, housing, interpersonal violence, social connections, stress, tobacco use, and transportation.  After lengthy discussion she denies any specific needs today.   4. Palliative Care- based on stage of cancer and/or identified needs today, I will refer patient to palliative care for goals of care and advanced care planning and ongoing symptom management  We also discussed the role of the Symptom Management Clinic at [facility] for acute issues and methods of contacting clinic/provider. She denies needing specific assistance at this time and She will be followed by Mariea Clonts, RN (Nurse Navigator).    Visit Diagnosis 1. Malignant neoplasm of right ovary [facility])    Patient expressed understanding and was in agreement with this plan. She also understands that She can call clinic at any time with any questions, concerns, or complaints.   A total of (10) minutes of  face-to-face time was spent with this patient with greater than 50% of that time in counseling and care-coordination.  Beckey Rutter, DNP, AGNP-C [facility] at  [facility]

## 2020-01-11 NOTE — Progress Notes (Signed)
Patient on schedule for [facility] Placement. Called and spoke with Patient and niece on phone with pre procedure instructions given. Made aware to be here @ 1000, NPO after MN as well as driver for home post recovery from procedure. Stated understanding.

## 2020-01-11 NOTE — H&P (View-Only) (Signed)
Patient on schedule for [facility] Placement. Called and spoke with Patient and niece on phone with pre procedure instructions given. Made aware to be here @ 1000, NPO after MN as well as driver for home post recovery from procedure. Stated understanding.

## 2020-01-12 ENCOUNTER — Other Ambulatory Visit: Payer: Self-pay | Admitting: Oncology

## 2020-01-12 ENCOUNTER — Ambulatory Visit
Admission: RE | Admit: 2020-01-12 | Discharge: 2020-01-12 | Disposition: A | Discharge status: Home / Self Care | Payer: Medicare PPO | Source: Ambulatory Visit | Attending: Oncology | Admitting: Oncology

## 2020-01-12 ENCOUNTER — Ambulatory Visit: Payer: Medicare PPO

## 2020-01-12 ENCOUNTER — Other Ambulatory Visit: Payer: Self-pay

## 2020-01-12 DIAGNOSIS — C569 Malignant neoplasm of unspecified ovary: Secondary | ICD-10-CM | POA: Diagnosis present

## 2020-01-12 HISTORY — PX: IR IMAGING GUIDED PORT INSERTION: IMG5740

## 2020-01-12 LAB — CA 125: Cancer Antigen (CA) 125: 1004 U/mL — ABNORMAL HIGH (ref 0.0–38.1)

## 2020-01-12 MED ORDER — CEFAZOLIN SODIUM-DEXTROSE 2-4 GM/100ML-% IV SOLN
2.0000 g | Freq: Once | INTRAVENOUS | Status: AC
  Administered 2020-01-12: 2 g via INTRAVENOUS

## 2020-01-12 MED ORDER — FENTANYL CITRATE (PF) 100 MCG/2ML IJ SOLN
INTRAMUSCULAR | Status: AC | PRN
  Administered 2020-01-12: 25 ug via INTRAVENOUS

## 2020-01-12 MED ORDER — MIDAZOLAM HCL 2 MG/2ML IJ SOLN
INTRAMUSCULAR | Status: AC
  Filled 2020-01-12: qty 2

## 2020-01-12 MED ORDER — HEPARIN SOD (PORK) LOCK FLUSH 100 UNIT/ML IV SOLN
INTRAVENOUS | Status: AC
  Filled 2020-01-12: qty 5

## 2020-01-12 MED ORDER — SODIUM CHLORIDE 0.9 % IV SOLN
INTRAVENOUS | Status: DC
  Administered 2020-01-12: 1000 mL via INTRAVENOUS

## 2020-01-12 MED ORDER — MIDAZOLAM HCL 2 MG/2ML IJ SOLN
INTRAMUSCULAR | Status: AC | PRN
  Administered 2020-01-12: 0.5 mg via INTRAVENOUS

## 2020-01-12 MED ORDER — FENTANYL CITRATE (PF) 100 MCG/2ML IJ SOLN
INTRAMUSCULAR | Status: AC
  Filled 2020-01-12: qty 2

## 2020-01-12 NOTE — Procedures (Signed)
  Procedure: R IJ Port placement   EBL:   minimal Complications:  none immediate  See full dictation in [facility] PACS.  D. Alma Mohiuddin MD Main # [phone] Pager  [phone]    

## 2020-01-12 NOTE — Progress Notes (Signed)
Tumor Board Documentation  Sherri [name] was presented by Dr [name] at our Tumor Board on 01/11/2020, which included representatives from medical oncology, radiation oncology, surgical, radiology, pathology, navigation, internal medicine, palliative care, research, pulmonology.  Sherri [name] currently presents as a new patient, for [place], for new positive pathology with history of the following treatments: active survellience, surgical intervention(s).  Additionally, we reviewed previous medical and familial history, history of present illness, and recent lab results along with all available histopathologic and imaging studies. The tumor board considered available treatment options and made the following recommendations: Chemotherapy, Additional screening (CT with Contrast to evaluate for possible clot) Refer to [facility] and Palliatve Care  The following procedures/referrals were also placed: No orders of the defined types were placed in this encounter.   Clinical Trial Status: not discussed   Staging used: AJCC Stage Group  AJCC Staging:       Group: Stage 4 Ovarian Cancer   National site-specific guidelines NCCN were discussed with respect to the case.  Tumor board is a meeting of clinicians from various specialty areas who evaluate and discuss patients for whom a multidisciplinary approach is being considered. Final determinations in the plan of care are those of the provider(s). The responsibility for follow up of recommendations given during tumor board is that of the provider.   Today's extended care, comprehensive team conference, Sherri [name] was not present for the discussion and was not examined.   Multidisciplinary Tumor Board is a multidisciplinary case peer review process.  Decisions discussed in the Multidisciplinary Tumor Board reflect the opinions of the specialists present at the conference without having examined the patient.  Ultimately, treatment and diagnostic decisions  rest with the primary provider(s) and the patient.

## 2020-01-12 NOTE — Progress Notes (Signed)
Sherri [name]  [phone] [phone]) [phone]  ID: Sherri [name] OB: 05/31/1933  MR#: [id number]  UMP#:[id number]  Patient Care Team: [name], MD as PCP - General (Family Medicine) Lloyd Huger, MD as Consulting Physician (Oncology) Clent Jacks, RN as Oncology Nurse Navigator  CHIEF COMPLAINT: Stage IVb ovarian cancer.  INTERVAL HISTORY: Patient returns to clinic today for further evaluation and initiation of dose reduced carboplatin and Taxol.  She is highly anxious, but otherwise feels well.  She has no neurologic complaints.  She denies any recent fevers or illnesses.  She has a good appetite and denies weight loss.  She has no chest pain, cough, or hemoptysis.  She denies any abdominal pain.  She denies any nausea, vomiting, constipation, or diarrhea.  She has no urinary complaints.  Patient offers no further specific complaints today.  REVIEW OF SYSTEMS:   Review of Systems  Constitutional: Negative.  Negative for diaphoresis, fever, malaise/fatigue and weight loss.  Respiratory: Negative.  Negative for cough and shortness of breath.   Cardiovascular: Negative.  Negative for chest pain and leg swelling.  Gastrointestinal: Negative.  Negative for abdominal pain.  Genitourinary: Negative.  Negative for dysuria.  Musculoskeletal: Negative.  Negative for joint pain.  Skin: Negative.  Negative for rash.  Neurological: Negative.  Negative for dizziness, sensory change, weakness and headaches.  Psychiatric/Behavioral: The patient is nervous/anxious.     As per HPI. Otherwise, a complete review of systems is negative.  PAST MEDICAL HISTORY: Past Medical History:  Diagnosis Date  . Anxiety   . Arthritis   . Breast cancer ([place]) 2013   RIGHT lumpectomy  . Bursitis of elbow    RIGHT  . Cancer [facility]) 2013   right breast lumpectomy with a few nodes removed  . Chronic kidney disease    stage 3  . Colitis   . Coronary artery  disease   . Diverticulitis   . Dysrhythmia    according to anxiety, tachy  . GERD (gastroesophageal reflux disease)    okay since took her gallbladder out  . H/O lumpectomy   . Heart disease   . History of coronary artery stent placement 2007   one stent  . HOH (hard of hearing)   . Hypercholesteremia   . Hypertension   . Hypothyroidism   . Myocardial infarction ([place])    2007  . Osteopenia   . Palpitations   . Personal history of radiation therapy   . Skin cancer     PAST SURGICAL HISTORY: Past Surgical History:  Procedure Laterality Date  . BREAST LUMPECTOMY Right 2013   lymph node dissection  . CATARACT EXTRACTION W/PHACO Right 07/23/2015   Procedure: CATARACT EXTRACTION PHACO AND INTRAOCULAR LENS PLACEMENT (IOC);  Surgeon: Birder Robson, MD;  Location: [facility] ORS;  Service: Ophthalmology;  Laterality: Right;  [place] 01:24 AP% 26.7 CDE 22.47 fluid pack lot # [id number]  . CATARACT EXTRACTION W/PHACO Left 08/13/2015   Procedure: CATARACT EXTRACTION PHACO AND INTRAOCULAR LENS PLACEMENT (IOC);  Surgeon: Birder Robson, MD;  Location: [facility] ORS;  Service: Ophthalmology;  Laterality: Left;  [place] 02:40 AP% 30.7 CDE 49.28 fluid pack lot # [id number]  . CHOLECYSTECTOMY    . COLONOSCOPY    . CORONARY ANGIOPLASTY  2007  . EYE SURGERY     bilateral cataract  . HARDWARE REMOVAL Right 04/29/2016   Procedure: HARDWARE REMOVAL;  Surgeon: Hessie Knows, MD;  Location: [facility] ORS;  Service: Orthopedics;  Laterality: Right;  . ORIF ELBOW FRACTURE Right  10/01/2015   Procedure: OPEN REDUCTION INTERNAL FIXATION (ORIF) ELBOW/OLECRANON FRACTURE;  Surgeon: Hessie Knows, MD;  Location: [facility] ORS;  Service: Orthopedics;  Laterality: Right;  Marland Kitchen VIDEO BRONCHOSCOPY WITH ENDOBRONCHIAL NAVIGATION N/A 01/01/2020   Procedure: VIDEO BRONCHOSCOPY WITH ENDOBRONCHIAL NAVIGATION WITH CELLZIZIO;  Surgeon: Tyler Pita, MD;  Location: [facility] ORS;  Service: Pulmonary;  Laterality: N/A;  . VIDEO BRONCHOSCOPY WITH  ENDOBRONCHIAL ULTRASOUND N/A 01/01/2020   Procedure: VIDEO BRONCHOSCOPY WITH ENDOBRONCHIAL ULTRASOUND;  Surgeon: Tyler Pita, MD;  Location: [facility] ORS;  Service: Pulmonary;  Laterality: N/A;    FAMILY HISTORY: Reviewed and unchanged. No reported history of malignancy or chronic disease.     ADVANCED DIRECTIVES:    HEALTH MAINTENANCE: Social History   Tobacco Use  . Smoking status: Former Smoker    Packs/day: 0.25    Years: 2.00    Pack years: 0.50    Types: Cigarettes    Quit date: 12/12/1955    Years since quitting: 64.1  . Smokeless tobacco: Never Used  Vaping Use  . Vaping Use: Never used  Substance Use Topics  . Alcohol use: Yes    Comment: wine occasionally  . Drug use: No     Colonoscopy:  PAP:  Bone density:  Lipid panel:  Allergies  Allergen Reactions  . Prednisone Other (See Comments)    Mood alterations    Current Outpatient Medications  Medication Sig Dispense Refill  . acetaminophen (TYLENOL) 500 MG tablet Take 500 mg by mouth every 8 (eight) hours as needed for moderate pain.    Marland Kitchen aspirin EC 81 MG tablet Take 81 mg by mouth at bedtime. Swallow whole.    . carvedilol (COREG) 3.125 MG tablet Take 3.125 mg by mouth 2 (two) times daily.     . Cholecalciferol (VITAMIN D-3 PO) Take 2,000 mg by mouth at bedtime.     . Coenzyme Q10 (CO Q 10 PO) Take 100 mg by mouth daily.    Arna Medici 25 MCG tablet Take 25 mcg by mouth daily before breakfast. 30 minutes to 1 hour prior to eating    . lidocaine-prilocaine (EMLA) cream Apply to affected area once 30 g 3  . lisinopril (PRINIVIL,ZESTRIL) 10 MG tablet Take 10 mg by mouth in the morning and at bedtime.     . nitroGLYCERIN (NITROSTAT) 0.4 MG SL tablet Place 0.4 mg under the tongue every 5 (five) minutes x 3 doses as needed for chest pain.     Marland Kitchen ondansetron (ZOFRAN) 8 MG tablet Take 1 tablet (8 mg total) by mouth 2 (two) times daily as needed for refractory nausea / vomiting. 60 tablet 2  . prochlorperazine  (COMPAZINE) 10 MG tablet Take 1 tablet (10 mg total) by mouth every 6 (six) hours as needed (Nausea or vomiting). 60 tablet 2  . senna (SENOKOT) 8.6 MG tablet Take 1 tablet by mouth daily as needed for constipation.    . simvastatin (ZOCOR) 40 MG tablet Take 40 mg by mouth at bedtime.    . ALPRAZolam (XANAX) 0.25 MG tablet Take 1 tablet (0.25 mg total) by mouth 2 (two) times daily as needed for anxiety. 60 tablet 0   No current facility-administered medications for this visit.   Facility-Administered Medications Ordered in Other Visits  Medication Dose Route Frequency Provider Last Rate Last Admin  . CARBOplatin (PARAPLATIN) 210 mg in sodium chloride 0.9 % 250 mL chemo infusion  210 mg Intravenous Once Lloyd Huger, MD      . fosaprepitant (EMEND) 150  mg in sodium chloride 0.9 % 145 mL IVPB  150 mg Intravenous Once Lloyd Huger, MD 450 mL/hr at 01/16/20 1141 150 mg at 01/16/20 1141  . heparin lock flush 100 unit/mL  500 Units Intravenous Once Lloyd Huger, MD      . PACLitaxel (TAXOL) 180 mg in sodium chloride 0.9 % 250 mL chemo infusion (> 76m/m2)  110 mg/m2 (Treatment Plan Recorded) Intravenous Once FLloyd Huger MD      . pegfilgrastim (NEULASTA ONPRO KIT) injection 6 mg  6 mg Subcutaneous Once FLloyd Huger MD        OBJECTIVE: Vitals:   01/16/20 0933  BP: (!) 173/84  Pulse: 61  Resp: 20  Temp: (!) 96.9 F (36.1 C)  SpO2: 97%     Body mass index is 22.06 kg/m.    ECOG FS:0 - Asymptomatic  General: Well-developed, well-nourished, no acute distress. Eyes: Pink conjunctiva, anicteric sclera. HEENT: Normocephalic, moist mucous membranes. Lungs: No audible wheezing or coughing. Heart: Regular rate and rhythm. Abdomen: Soft, nontender, no obvious distention. Musculoskeletal: No edema, cyanosis, or clubbing. Neuro: Alert, answering all questions appropriately. Cranial nerves grossly intact. Skin: No rashes or petechiae noted. Psych: Normal  affect.   LAB RESULTS:  Lab Results  Component Value Date   NA 137 01/16/2020   K 4.2 01/16/2020   CL 106 01/16/2020   CO2 23 01/16/2020   GLUCOSE 121 (H) 01/16/2020   BUN 24 (H) 01/16/2020   CREATININE 1.38 (H) 01/16/2020   CALCIUM 8.9 01/16/2020   PROT 7.0 01/16/2020   ALBUMIN 3.7 01/16/2020   AST 18 01/16/2020   ALT 8 01/16/2020   ALKPHOS 49 01/16/2020   BILITOT 0.5 01/16/2020   GFRNONAA 35 (L) 01/16/2020   GFRAA 41 (L) 01/16/2020    Lab Results  Component Value Date   WBC 11.6 (H) 01/16/2020   NEUTROABS 8.1 (H) 01/16/2020   HGB 11.0 (L) 01/16/2020   HCT 33.8 (L) 01/16/2020   MCV 92.1 01/16/2020   PLT 209 01/16/2020     STUDIES: DG Chest Port 1 View  Result Date: 01/01/2020 CLINICAL DATA:  Status post biopsy. EXAM: PORTABLE CHEST 1 VIEW COMPARISON:  November 16, 2019. FINDINGS: The heart size and mediastinal contours are within normal limits. No pneumothorax or pleural effusion is noted. Multiple irregular nodular densities in reticular densities are noted throughout both lungs consistent with metastatic disease as described on prior CT scan. The visualized skeletal structures are unremarkable. IMPRESSION: Multiple irregular nodular densities and reticular densities are noted throughout both lungs consistent with metastatic disease as described on prior CT scan. Electronically Signed   By: JMarijo ConceptionM.D.   On: 01/01/2020 15:43   DG C-Arm 1-60 Min-No Report  Result Date: 01/01/2020 Fluoroscopy was utilized by the requesting physician.  No radiographic interpretation.    ASSESSMENT: Stage IVb ovarian cancer.  PLAN:    1.  Stage IVb ovarian cancer: Despite history of breast cancer, biopsy consistent with carcinoma of gynecologic origin.  PET scan results from December 11, 2019 reviewed independently with large right pelvic region, extensive retroperitoneal lymphadenopathy and bilateral pulmonary disease consistent with metastatic ovarian cancer.  CA-125 is significantly  elevated greater than 1000.  Appreciate gynecology oncology input.  We will proceed with cycle 1 of dose reduced carboplatinum and Taxol today.  Patient will also receive on pro-Neulasta support.  Patient has had port placement.  Return to clinic in 1 week for laboratory work and to assess her toleration of  treatment and then again in 3 weeks for further evaluation and consideration of cycle 2.  Will reimage at the conclusion of cycle 4. 2.  History of breast cancer: Patient has a history of stage IIb ER/PR positive, upper inner quadrant right breast.  Previously we did not have access to patient's pathology report, her stage and location of breast were determined based on clinic notes.  She received 6 months of Arimidex between April 2013 and October 2013 prior to undergoing right breast lumpectomy. She reports she underwent adjuvant XRT, but did not receive adjuvant chemotherapy.  Patient completed 5 years of Arimidex in October 2018.  Her most recent mammogram on January 30, 2019 was reported as BI-RADS 1.   3.  Anxiety: We will include IV Ativan with patient's premeds for each treatment.  She was also given a prescription for Xanax as needed.  I spent a total of 30 minutes reviewing chart data, face-to-face evaluation with the patient, counseling and coordination of care as detailed above.    Patient expressed understanding and was in agreement with this plan. She also understands that She can call clinic at any time with any questions, concerns, or complaints.    Lloyd Huger, MD   01/16/2020 11:58 AM

## 2020-01-16 ENCOUNTER — Inpatient Hospital Stay: Payer: Medicare PPO

## 2020-01-16 ENCOUNTER — Other Ambulatory Visit: Payer: Self-pay

## 2020-01-16 ENCOUNTER — Other Ambulatory Visit: Payer: Medicare PPO

## 2020-01-16 ENCOUNTER — Inpatient Hospital Stay (HOSPITAL_BASED_OUTPATIENT_CLINIC_OR_DEPARTMENT_OTHER): Payer: Medicare PPO | Admitting: Hospice and Palliative Medicine

## 2020-01-16 ENCOUNTER — Encounter: Payer: Self-pay | Admitting: Licensed Clinical Social Worker

## 2020-01-16 ENCOUNTER — Inpatient Hospital Stay: Payer: Medicare PPO | Admitting: Licensed Clinical Social Worker

## 2020-01-16 ENCOUNTER — Inpatient Hospital Stay (HOSPITAL_BASED_OUTPATIENT_CLINIC_OR_DEPARTMENT_OTHER): Payer: Medicare PPO | Admitting: Oncology

## 2020-01-16 ENCOUNTER — Encounter: Payer: Self-pay | Admitting: Oncology

## 2020-01-16 ENCOUNTER — Other Ambulatory Visit: Payer: Self-pay | Admitting: Oncology

## 2020-01-16 VITALS — BP 173/84 | HR 61 | Temp 96.9°F | Resp 20 | Wt 128.5 lb

## 2020-01-16 VITALS — BP 162/78 | HR 61

## 2020-01-16 DIAGNOSIS — C569 Malignant neoplasm of unspecified ovary: Secondary | ICD-10-CM

## 2020-01-16 DIAGNOSIS — Z79899 Other long term (current) drug therapy: Secondary | ICD-10-CM

## 2020-01-16 DIAGNOSIS — C561 Malignant neoplasm of right ovary: Secondary | ICD-10-CM

## 2020-01-16 DIAGNOSIS — Z853 Personal history of malignant neoplasm of breast: Secondary | ICD-10-CM

## 2020-01-16 DIAGNOSIS — F419 Anxiety disorder, unspecified: Secondary | ICD-10-CM

## 2020-01-16 DIAGNOSIS — G479 Sleep disorder, unspecified: Secondary | ICD-10-CM

## 2020-01-16 DIAGNOSIS — Z515 Encounter for palliative care: Secondary | ICD-10-CM

## 2020-01-16 DIAGNOSIS — Z803 Family history of malignant neoplasm of breast: Secondary | ICD-10-CM

## 2020-01-16 DIAGNOSIS — F3289 Other specified depressive episodes: Secondary | ICD-10-CM

## 2020-01-16 DIAGNOSIS — R630 Anorexia: Secondary | ICD-10-CM

## 2020-01-16 DIAGNOSIS — Z808 Family history of malignant neoplasm of other organs or systems: Secondary | ICD-10-CM

## 2020-01-16 DIAGNOSIS — Z5111 Encounter for antineoplastic chemotherapy: Secondary | ICD-10-CM | POA: Diagnosis not present

## 2020-01-16 LAB — CBC WITH DIFFERENTIAL/PLATELET
Abs Immature Granulocytes: 0.04 10*3/uL (ref 0.00–0.07)
Basophils Absolute: 0.1 10*3/uL (ref 0.0–0.1)
Basophils Relative: 0 %
Eosinophils Absolute: 0.5 10*3/uL (ref 0.0–0.5)
Eosinophils Relative: 4 %
HCT: 33.8 % — ABNORMAL LOW (ref 36.0–46.0)
Hemoglobin: 11 g/dL — ABNORMAL LOW (ref 12.0–15.0)
Immature Granulocytes: 0 %
Lymphocytes Relative: 18 %
Lymphs Abs: 2 10*3/uL (ref 0.7–4.0)
MCH: 30 pg (ref 26.0–34.0)
MCHC: 32.5 g/dL (ref 30.0–36.0)
MCV: 92.1 fL (ref 80.0–100.0)
Monocytes Absolute: 0.9 10*3/uL (ref 0.1–1.0)
Monocytes Relative: 8 %
Neutro Abs: 8.1 10*3/uL — ABNORMAL HIGH (ref 1.7–7.7)
Neutrophils Relative %: 70 %
Platelets: 209 10*3/uL (ref 150–400)
RBC: 3.67 MIL/uL — ABNORMAL LOW (ref 3.87–5.11)
RDW: 14.8 % (ref 11.5–15.5)
WBC: 11.6 10*3/uL — ABNORMAL HIGH (ref 4.0–10.5)
nRBC: 0 % (ref 0.0–0.2)

## 2020-01-16 LAB — COMPREHENSIVE METABOLIC PANEL
ALT: 8 U/L (ref 0–44)
AST: 18 U/L (ref 15–41)
Albumin: 3.7 g/dL (ref 3.5–5.0)
Alkaline Phosphatase: 49 U/L (ref 38–126)
Anion gap: 8 (ref 5–15)
BUN: 24 mg/dL — ABNORMAL HIGH (ref 8–23)
CO2: 23 mmol/L (ref 22–32)
Calcium: 8.9 mg/dL (ref 8.9–10.3)
Chloride: 106 mmol/L (ref 98–111)
Creatinine, Ser: 1.38 mg/dL — ABNORMAL HIGH (ref 0.44–1.00)
GFR calc Af Amer: 41 mL/min — ABNORMAL LOW (ref >60)
GFR calc non Af Amer: 35 mL/min — ABNORMAL LOW (ref >60)
Glucose, Bld: 121 mg/dL — ABNORMAL HIGH (ref 70–99)
Potassium: 4.2 mmol/L (ref 3.5–5.1)
Sodium: 137 mmol/L (ref 135–145)
Total Bilirubin: 0.5 mg/dL (ref 0.3–1.2)
Total Protein: 7 g/dL (ref 6.5–8.1)

## 2020-01-16 MED ORDER — MIRTAZAPINE 7.5 MG PO TABS
7.5000 mg | ORAL_TABLET | Freq: Every day | ORAL | 3 refills | Status: DC
Start: 2020-01-16 — End: 2020-01-24

## 2020-01-16 MED ORDER — LORAZEPAM 2 MG/ML IJ SOLN
0.5000 mg | Freq: Once | INTRAMUSCULAR | Status: AC
  Administered 2020-01-16: 0.5 mg via INTRAVENOUS
  Filled 2020-01-16: qty 1

## 2020-01-16 MED ORDER — HEPARIN SOD (PORK) LOCK FLUSH 100 UNIT/ML IV SOLN
INTRAVENOUS | Status: AC
  Filled 2020-01-16: qty 5

## 2020-01-16 MED ORDER — SODIUM CHLORIDE 0.9 % IV SOLN
212.0000 mg | Freq: Once | INTRAVENOUS | Status: AC
  Administered 2020-01-16: 210 mg via INTRAVENOUS
  Filled 2020-01-16: qty 21

## 2020-01-16 MED ORDER — SODIUM CHLORIDE 0.9 % IV SOLN
Freq: Once | INTRAVENOUS | Status: AC
  Filled 2020-01-16: qty 250

## 2020-01-16 MED ORDER — SODIUM CHLORIDE 0.9 % IV SOLN
10.0000 mg | Freq: Once | INTRAVENOUS | Status: AC
  Administered 2020-01-16: 10 mg via INTRAVENOUS
  Filled 2020-01-16: qty 10

## 2020-01-16 MED ORDER — SODIUM CHLORIDE 0.9 % IV SOLN
150.0000 mg | Freq: Once | INTRAVENOUS | Status: AC
  Administered 2020-01-16: 150 mg via INTRAVENOUS
  Filled 2020-01-16: qty 150

## 2020-01-16 MED ORDER — SODIUM CHLORIDE 0.9 % IV SOLN
110.0000 mg/m2 | Freq: Once | INTRAVENOUS | Status: AC
  Administered 2020-01-16: 180 mg via INTRAVENOUS
  Filled 2020-01-16: qty 30

## 2020-01-16 MED ORDER — HEPARIN SOD (PORK) LOCK FLUSH 100 UNIT/ML IV SOLN
500.0000 [IU] | Freq: Once | INTRAVENOUS | Status: AC
  Administered 2020-01-16: 500 [IU] via INTRAVENOUS
  Filled 2020-01-16: qty 5

## 2020-01-16 MED ORDER — FAMOTIDINE IN NACL 20-0.9 MG/50ML-% IV SOLN
20.0000 mg | Freq: Once | INTRAVENOUS | Status: AC
  Administered 2020-01-16: 20 mg via INTRAVENOUS
  Filled 2020-01-16: qty 50

## 2020-01-16 MED ORDER — ALPRAZOLAM 0.25 MG PO TABS: 0.2500 mg | ORAL_TABLET | Freq: Two times a day (BID) | ORAL | 0 refills | Status: DC | PRN

## 2020-01-16 MED ORDER — DIPHENHYDRAMINE HCL 50 MG/ML IJ SOLN
25.0000 mg | Freq: Once | INTRAMUSCULAR | Status: AC
  Administered 2020-01-16: 25 mg via INTRAVENOUS
  Filled 2020-01-16: qty 1

## 2020-01-16 MED ORDER — PALONOSETRON HCL INJECTION 0.25 MG/5ML
0.2500 mg | Freq: Once | INTRAVENOUS | Status: AC
  Administered 2020-01-16: 0.25 mg via INTRAVENOUS
  Filled 2020-01-16: qty 5

## 2020-01-16 MED ORDER — PEGFILGRASTIM 6 MG/0.6ML ~~LOC~~ PSKT
6.0000 mg | PREFILLED_SYRINGE | Freq: Once | SUBCUTANEOUS | Status: AC
  Administered 2020-01-16: 6 mg via SUBCUTANEOUS
  Filled 2020-01-16: qty 0.6

## 2020-01-16 MED ORDER — SODIUM CHLORIDE 0.9% FLUSH
10.0000 mL | Freq: Once | INTRAVENOUS | Status: AC
  Administered 2020-01-16: 10 mL via INTRAVENOUS
  Filled 2020-01-16: qty 10

## 2020-01-16 NOTE — Progress Notes (Signed)
Patient received Benadryl, Aloxi, and Pepcid infusing when patient started complaining of chest tightness and abdomen feeling full. Pepcid stopped, IVF NS bolus started. BP significantly elevated. EKG performed, no abnormalities noted. 0.5 mg Ativan ordered. Will continue to monitor.  Patient BP improved after Ativan, patient states she feels much better. Tolerated treatment without any other issues. Educated patient and niece, Raquel Sarna about the Neulasta On Pro

## 2020-01-16 NOTE — Progress Notes (Signed)
Patient and daughter here today for follow up. Would like to discuss if tylenol is ok to take being on treatment. Would also like to discuss claritin and duration needed. Patient expresses concerns with heart rate being elevated when she is anxious and would like to discuss medication to help calm her down. Patient's bp is elevated 173/84 patient states she is nervous when coming to doctor. She denies any pain at this time.

## 2020-01-16 NOTE — Progress Notes (Signed)
[name]  Telephone:([phone] [phone]   Name: Sherri [name] Date: 01/16/2020 MRN: [id number]  DOB: May 08, 1933  Patient Care Team: [name], MD as PCP - General (Family Medicine) Lloyd Huger, MD as Consulting Physician (Oncology) Clent Jacks, RN as Oncology Nurse Navigator    REASON FOR CONSULTATION: Sherri [name] is a 86 y.o. female with multiple medical problems including history of stage IIb breast cancer status post lumpectomy and XRT with completion of 5 years of Arimidex in October 2019.  She was subsequently diagnosed with stage IV ovarian cancer in June 2021.  PET scan on 12/11/2019 revealed diffuse pulmonary metastatic disease with extensive retroperitoneal lymphadenopathy and right ureter obstructed by her pelvic mass with right-sided hydronephrosis.  Currently on systemic chemotherapy.  Palliative care was consulted to help address goals and manage ongoing symptoms.  SOCIAL HISTORY:     reports that she quit smoking about 64 years ago. Her smoking use included cigarettes. She has a 0.50 pack-year smoking history. She has never used smokeless tobacco. She reports current alcohol use. She reports that she does not use drugs.   Patient is divorced.  She lives at home with her brother.  She has a son in [place] and another son and daughter who live out of state.  Patient previously worked as a Educational psychologist.  ADVANCE DIRECTIVES:  Not on file  CODE STATUS:   PAST MEDICAL HISTORY: Past Medical History:  Diagnosis Date  . Anxiety   . Arthritis   . Breast cancer ([place]) 2013   RIGHT lumpectomy  . Bursitis of elbow    RIGHT  . Cancer [facility]) 2013   right breast lumpectomy with a few nodes removed  . Chronic kidney disease    stage 3  . Colitis   . Coronary artery disease   . Diverticulitis   . Dysrhythmia    according to anxiety, tachy  . GERD (gastroesophageal reflux disease)    okay  since took her gallbladder out  . H/O lumpectomy   . Heart disease   . History of coronary artery stent placement 2007   one stent  . HOH (hard of hearing)   . Hypercholesteremia   . Hypertension   . Hypothyroidism   . Myocardial infarction ([place])    2007  . Osteopenia   . Palpitations   . Personal history of radiation therapy   . Skin cancer     PAST SURGICAL HISTORY:  Past Surgical History:  Procedure Laterality Date  . BREAST LUMPECTOMY Right 2013   lymph node dissection  . CATARACT EXTRACTION W/PHACO Right 07/23/2015   Procedure: CATARACT EXTRACTION PHACO AND INTRAOCULAR LENS PLACEMENT (IOC);  Surgeon: Birder Robson, MD;  Location: [facility] ORS;  Service: Ophthalmology;  Laterality: Right;  [place] 01:24 AP% 26.7 CDE 22.47 fluid pack lot # [id number]  . CATARACT EXTRACTION W/PHACO Left 08/13/2015   Procedure: CATARACT EXTRACTION PHACO AND INTRAOCULAR LENS PLACEMENT (IOC);  Surgeon: Birder Robson, MD;  Location: [facility] ORS;  Service: Ophthalmology;  Laterality: Left;  [place] 02:40 AP% 30.7 CDE 49.28 fluid pack lot # [id number]  . CHOLECYSTECTOMY    . COLONOSCOPY    . CORONARY ANGIOPLASTY  2007  . EYE SURGERY     bilateral cataract  . HARDWARE REMOVAL Right 04/29/2016   Procedure: HARDWARE REMOVAL;  Surgeon: Hessie Knows, MD;  Location: [facility] ORS;  Service: Orthopedics;  Laterality: Right;  . ORIF ELBOW FRACTURE Right 10/01/2015   Procedure: OPEN REDUCTION  INTERNAL FIXATION (ORIF) ELBOW/OLECRANON FRACTURE;  Surgeon: Hessie Knows, MD;  Location: [facility] ORS;  Service: Orthopedics;  Laterality: Right;  Marland Kitchen VIDEO BRONCHOSCOPY WITH ENDOBRONCHIAL NAVIGATION N/A 01/01/2020   Procedure: VIDEO BRONCHOSCOPY WITH ENDOBRONCHIAL NAVIGATION WITH CELLZIZIO;  Surgeon: Tyler Pita, MD;  Location: [facility];  Service: Pulmonary;  Laterality: N/A;  . VIDEO BRONCHOSCOPY WITH ENDOBRONCHIAL ULTRASOUND N/A 01/01/2020   Procedure: VIDEO BRONCHOSCOPY WITH ENDOBRONCHIAL ULTRASOUND;  Surgeon: Tyler Pita, MD;   Location: [facility] OR[facility];  Service: Pulmonary;  Laterality: N/A;    HEMATOLOGY/ONCOLOGY HISTORY:  Oncology History  Ovarian cancer ([place])  01/05/2020 Initial Diagnosis   Ovarian cancer ([place])   01/12/2020 Cancer Staging   Staging form: Ovary, Fallopian Tube, and Primary Peritoneal Carcinoma, AJCC 8th Edition - Clinical: FIGO Stage IVB (cTX, cN1b, pM1b) - Signed by Lloyd Huger, MD on 01/12/2020   01/16/2020 -  Chemotherapy   The patient had palonosetron (ALOXI) injection 0.25 mg, 0.25 mg, Intravenous,  Once, 1 of 6 cycles Administration: 0.25 mg (01/16/2020) pegfilgrastim (NEULASTA ONPRO KIT) injection 6 mg, 6 mg, Subcutaneous, Once, 1 of 6 cycles CARBOplatin (PARAPLATIN) 210 mg in sodium chloride 0.9 % 250 mL chemo infusion, 210 mg (103.7 % of original dose 204.4 mg), Intravenous,  Once, 1 of 6 cycles Dose modification:   (original dose 204.4 mg, Cycle 1) fosaprepitant (EMEND) 150 mg in sodium chloride 0.9 % 145 mL IVPB, 150 mg, Intravenous,  Once, 1 of 6 cycles Administration: 150 mg (01/16/2020) PACLitaxel (TAXOL) 180 mg in sodium chloride 0.9 % 250 mL chemo infusion (> 83m/m2), 110 mg/m2 = 180 mg (100 % of original dose 110 mg/m2), Intravenous,  Once, 1 of 6 cycles Dose modification: 110 mg/m2 (original dose 110 mg/m2, Cycle 1, Reason: Patient Age)  for chemotherapy treatment.      ALLERGIES:  is allergic to prednisone.  MEDICATIONS:  Current Outpatient Medications  Medication Sig Dispense Refill  . acetaminophen (TYLENOL) 500 MG tablet Take 500 mg by mouth every 8 (eight) hours as needed for moderate pain.    .Marland KitchenALPRAZolam (XANAX) 0.25 MG tablet Take 1 tablet (0.25 mg total) by mouth 2 (two) times daily as needed for anxiety. 60 tablet 0  . aspirin EC 81 MG tablet Take 81 mg by mouth at bedtime. Swallow whole.    . carvedilol (COREG) 3.125 MG tablet Take 3.125 mg by mouth 2 (two) times daily.     . Cholecalciferol (VITAMIN D-3 PO) Take 2,000 mg by mouth at bedtime.     . Coenzyme  Q10 (CO Q 10 PO) Take 100 mg by mouth daily.    .Arna Medici25 MCG tablet Take 25 mcg by mouth daily before breakfast. 30 minutes to 1 hour prior to eating    . lidocaine-prilocaine (EMLA) cream Apply to affected area once 30 g 3  . lisinopril (PRINIVIL,ZESTRIL) 10 MG tablet Take 10 mg by mouth in the morning and at bedtime.     . nitroGLYCERIN (NITROSTAT) 0.4 MG SL tablet Place 0.4 mg under the tongue every 5 (five) minutes x 3 doses as needed for chest pain.     .Marland Kitchenondansetron (ZOFRAN) 8 MG tablet Take 1 tablet (8 mg total) by mouth 2 (two) times daily as needed for refractory nausea / vomiting. 60 tablet 2  . prochlorperazine (COMPAZINE) 10 MG tablet Take 1 tablet (10 mg total) by mouth every 6 (six) hours as needed (Nausea or vomiting). 60 tablet 2  . senna (SENOKOT) 8.6 MG tablet Take 1 tablet by  mouth daily as needed for constipation.    . simvastatin (ZOCOR) 40 MG tablet Take 40 mg by mouth at bedtime.     No current facility-administered medications for this visit.   Facility-Administered Medications Ordered in Other Visits  Medication Dose Route Frequency Provider Last Rate Last Admin  . CARBOplatin (PARAPLATIN) 210 mg in sodium chloride 0.9 % 250 mL chemo infusion  210 mg Intravenous Once Lloyd Huger, MD      . heparin lock flush 100 unit/mL  500 Units Intravenous Once Lloyd Huger, MD      . PACLitaxel (TAXOL) 180 mg in sodium chloride 0.9 % 250 mL chemo infusion (> 59m/m2)  110 mg/m2 (Treatment Plan Recorded) Intravenous Once FLloyd Huger MD      . pegfilgrastim (NEULASTA ONPRO KIT) injection 6 mg  6 mg Subcutaneous Once FLloyd Huger MD        VITAL SIGNS: There were no vitals taken for this visit. There were no vitals filed for this visit.  Estimated body mass index is 22.06 kg/m as calculated from the following:   Height as of 01/12/20: '5\' 4"'  (1.626 m).   Weight as of an earlier encounter on 01/16/20: 128 lb 8 oz (58.3 kg).  LABS: CBC:     Component Value Date/Time   WBC 11.6 (H) 01/16/2020 0901   HGB 11.0 (L) 01/16/2020 0901   HCT 33.8 (L) 01/16/2020 0901   PLT 209 01/16/2020 0901   MCV 92.1 01/16/2020 0901   NEUTROABS 8.1 (H) 01/16/2020 0901   LYMPHSABS 2.0 01/16/2020 0901   MONOABS 0.9 01/16/2020 0901   EOSABS 0.5 01/16/2020 0901   BASOSABS 0.1 01/16/2020 0901   Comprehensive Metabolic Panel:    Component Value Date/Time   NA 137 01/16/2020 0901   K 4.2 01/16/2020 0901   CL 106 01/16/2020 0901   CO2 23 01/16/2020 0901   BUN 24 (H) 01/16/2020 0901   CREATININE 1.38 (H) 01/16/2020 0901   GLUCOSE 121 (H) 01/16/2020 0901   CALCIUM 8.9 01/16/2020 0901   AST 18 01/16/2020 0901   ALT 8 01/16/2020 0901   ALKPHOS 49 01/16/2020 0901   BILITOT 0.5 01/16/2020 0901   PROT 7.0 01/16/2020 0901   ALBUMIN 3.7 01/16/2020 0901    RADIOGRAPHIC STUDIES: DG Chest Port 1 View  Result Date: 01/01/2020 CLINICAL DATA:  Status post biopsy. EXAM: PORTABLE CHEST 1 VIEW COMPARISON:  November 16, 2019. FINDINGS: The heart size and mediastinal contours are within normal limits. No pneumothorax or pleural effusion is noted. Multiple irregular nodular densities in reticular densities are noted throughout both lungs consistent with metastatic disease as described on prior CT scan. The visualized skeletal structures are unremarkable. IMPRESSION: Multiple irregular nodular densities and reticular densities are noted throughout both lungs consistent with metastatic disease as described on prior CT scan. Electronically Signed   By: JMarijo ConceptionM.D.   On: 01/01/2020 15:43   DG C-Arm 1-60 Min-No Report  Result Date: 01/01/2020 Fluoroscopy was utilized by the requesting physician.  No radiographic interpretation.    PERFORMANCE STATUS (ECOG) : 1 - Symptomatic but completely ambulatory  Review of Systems Unless otherwise noted, a complete review of systems is negative.  Physical Exam General: NAD Pulmonary: Unlabored Extremities: no edema, no  joint deformities Skin: no rashes Neurological: Weakness but otherwise nonfocal  IMPRESSION: Patient was seen in the infusion area today.  Earlier in the day, she had an episode of severe anxiety requiring dose of lorazepam.  Patient states  that she is now feeling "back to normal."  Patient describes having frequent episodes of anxiety recently.  She is having difficulty with sleeping at night due to feeling anxious and being unable to "turn off the thoughts."  She describes recent decline in oral intake.  She also describes some depressive thoughts.  No SI/HI.  Dr. [name] has prescribed alprazolam as needed.  Patient is interested in trial of an antidepressant.  We will start mirtazapine at bedtime  Patient says that her goals are aligned with treatment.  She says that she initially could not decide if she wanted to pursue treatment or not but now feels that she made the right decision.  We will plan follow-up in the clinic to hopefully address advanced care planning and introduced the MOST form.  PLAN: -Continue current scope of treatment -Agree with as needed alprazolam -Start mirtazapine 7.5 mg nightly -We will benefit from conversation regarding ACP -RTC in 2 to 3 weeks  Case and plan discussed with Dr. [name]  Patient expressed understanding and was in agreement with this plan. She also understands that She can call the clinic at any time with any questions, concerns, or complaints.     Time Total: 20 minutes  Visit consisted of counseling and education dealing with the complex and emotionally intense issues of symptom management and palliative care in the setting of serious and potentially life-threatening illness.Greater than 50%  of this time was spent counseling and coordinating care related to the above assessment and plan.  Signed by: Altha Harm, PhD, NP-C

## 2020-01-16 NOTE — Progress Notes (Signed)
REFERRING PROVIDER: Lloyd Huger, MD [address] [place],  [place] [address]  PRIMARY PROVIDER:  [name], MD  PRIMARY REASON FOR VISIT:  1. Malignant neoplasm of right ovary ([place])   2. Family history of breast cancer   3. Family history of melanoma   4. History of breast cancer      HISTORY OF PRESENT ILLNESS:   Sherri [name], a 86 y.o. female, was seen for a [place] cancer genetics consultation at the request of Dr. [name] due to a personal and family history of cancer.  Sherri [name] presents to clinic today to discuss the possibility of a hereditary predisposition to cancer, genetic testing, and to further clarify her future cancer risks, as well as potential cancer risks for family members.   At the age of 89, Sherri [name] was diagnosed with right breast cancer. This was treated with lumpectomy and radiation as well as Arimidex.   In 2021, at the age of 36, Sherri [name] was diagnosed with Stage IVb ovarian cancer that is currently being treated with chemotherapy.   CANCER HISTORY:  Oncology History  Ovarian cancer ([place])  01/05/2020 Initial Diagnosis   Ovarian cancer ([place])   01/12/2020 Cancer Staging   Staging form: Ovary, Fallopian Tube, and Primary Peritoneal Carcinoma, AJCC 8th Edition - Clinical: FIGO Stage IVB (cTX, cN1b, pM1b) - Signed by Lloyd Huger, MD on 01/12/2020   01/16/2020 -  Chemotherapy   The patient had palonosetron (ALOXI) injection 0.25 mg, 0.25 mg, Intravenous,  Once, 1 of 6 cycles Administration: 0.25 mg (01/16/2020) pegfilgrastim (NEULASTA ONPRO KIT) injection 6 mg, 6 mg, Subcutaneous, Once, 1 of 6 cycles CARBOplatin (PARAPLATIN) 210 mg in sodium chloride 0.9 % 250 mL chemo infusion, 210 mg (103.7 % of original dose 204.4 mg), Intravenous,  Once, 1 of 6 cycles Dose modification:   (original dose 204.4 mg, Cycle 1) fosaprepitant (EMEND) 150 mg in sodium chloride 0.9 % 145 mL IVPB, 150 mg, Intravenous,  Once, 1 of 6 cycles Administration: 150  mg (01/16/2020) PACLitaxel (TAXOL) 180 mg in sodium chloride 0.9 % 250 mL chemo infusion (> 72m/m2), 110 mg/m2 = 180 mg (100 % of original dose 110 mg/m2), Intravenous,  Once, 1 of 6 cycles Dose modification: 110 mg/m2 (original dose 110 mg/m2, Cycle 1, Reason: Patient Age)  for chemotherapy treatment.       RISK FACTORS:  Menarche was at age 86  First live birth at age 86  OCP use for approximately 0 years.  Ovaries intact: yes.  Hysterectomy: no.  Menopausal status: postmenopausal.  HRT use: 0 years. Colonoscopy: yes; normal. Mammogram within the last year: yes. Number of breast biopsies: 1.   Past Medical History:  Diagnosis Date  . Anxiety   . Arthritis   . [place]   RIGHT lumpectomy  . Bursitis of elbow    RIGHT  . Cancer ([facility] 2013   right breast lumpectomy with a few nodes removed  . Chronic kidney disease    stage 3  . Colitis   . Coronary artery disease   . Diverticulitis   . Dysrhythmia    according to anxiety, tachy  . Family history of breast cancer   . Family history of melanoma   . GERD (gastroesophageal reflux disease)    okay since took her gallbladder out  . H/O lumpectomy   . Heart disease   . History of breast cancer   . History of coronary artery stent placement 2007  one stent  . HOH (hard of hearing)   . Hypercholesteremia   . Hypertension   . Hypothyroidism   . Myocardial infarction ([place])    2007  . Osteopenia   . Palpitations   . Personal history of radiation therapy   . Skin cancer     Past Surgical History:  Procedure Laterality Date  . BREAST LUMPECTOMY Right 2013   lymph node dissection  . CATARACT EXTRACTION W/PHACO Right 07/23/2015   Procedure: CATARACT EXTRACTION PHACO AND INTRAOCULAR LENS PLACEMENT (IOC);  Surgeon: Birder Robson, MD;  Location: [facility] ORS;  Service: Ophthalmology;  Laterality: Right;  [place] 01:24 AP% 26.7 CDE 22.47 fluid pack lot # [id number]  . CATARACT EXTRACTION W/PHACO Left  08/13/2015   Procedure: CATARACT EXTRACTION PHACO AND INTRAOCULAR LENS PLACEMENT (IOC);  Surgeon: Birder Robson, MD;  Location: [facility] ORS;  Service: Ophthalmology;  Laterality: Left;  [place] 02:40 AP% 30.7 CDE 49.28 fluid pack lot # [id number]  . CHOLECYSTECTOMY    . COLONOSCOPY    . CORONARY ANGIOPLASTY  2007  . EYE SURGERY     bilateral cataract  . HARDWARE REMOVAL Right 04/29/2016   Procedure: HARDWARE REMOVAL;  Surgeon: Hessie Knows, MD;  Location: [facility] ORS;  Service: Orthopedics;  Laterality: Right;  . ORIF ELBOW FRACTURE Right 10/01/2015   Procedure: OPEN REDUCTION INTERNAL FIXATION (ORIF) ELBOW/OLECRANON FRACTURE;  Surgeon: Hessie Knows, MD;  Location: [facility] ORS;  Service: Orthopedics;  Laterality: Right;  Marland Kitchen VIDEO BRONCHOSCOPY WITH ENDOBRONCHIAL NAVIGATION N/A 01/01/2020   Procedure: VIDEO BRONCHOSCOPY WITH ENDOBRONCHIAL NAVIGATION WITH CELLZIZIO;  Surgeon: Tyler Pita, MD;  Location: [facility] ORS;  Service: Pulmonary;  Laterality: N/A;  . VIDEO BRONCHOSCOPY WITH ENDOBRONCHIAL ULTRASOUND N/A 01/01/2020   Procedure: VIDEO BRONCHOSCOPY WITH ENDOBRONCHIAL ULTRASOUND;  Surgeon: Tyler Pita, MD;  Location: [facility] ORS;  Service: Pulmonary;  Laterality: N/A;    Social History   Socioeconomic History  . Marital status: Divorced    Spouse name: Not on file  . Number of children: Not on file  . Years of education: Not on file  . Highest education level: Not on file  Occupational History  . Not on file  Tobacco Use  . Smoking status: Former Smoker    Packs/day: 0.25    Years: 2.00    Pack years: 0.50    Types: Cigarettes    Quit date: 12/12/1955    Years since quitting: 64.1  . Smokeless tobacco: Never Used  Vaping Use  . Vaping Use: Never used  Substance and Sexual Activity  . Alcohol use: Yes    Comment: wine occasionally  . Drug use: No  . Sexual activity: Not on file  Other Topics Concern  . Not on file  Social History Narrative  . Not on file   Social Determinants of  Health   Financial Resource Strain:   . Difficulty of Paying Living Expenses:   Food Insecurity:   . Worried About Charity fundraiser in the Last Year:   . Arboriculturist in the Last Year:   Transportation Needs:   . Film/video editor (Medical):   Marland Kitchen Lack of Transportation (Non-Medical):   Physical Activity:   . Days of Exercise per Week:   . Minutes of Exercise per Session:   Stress:   . Feeling of Stress :   Social Connections:   . Frequency of Communication with Friends and Family:   . Frequency of Social Gatherings with Friends and Family:   . Attends Religious Services:   .  Active Member of Clubs or Organizations:   . Attends Archivist Meetings:   Marland Kitchen Marital Status:      FAMILY HISTORY:  We obtained a detailed, 4-generation family history.  Significant diagnoses are listed below: Family History  Problem Relation Age of Onset  . Cancer Father        unsure type  . Breast cancer Sister 68  . Melanoma Paternal Uncle   . Cancer Sister        unsure type  . Cancer Cousin        unsure type   Sherri [name] has a daughter, 64, and two sons, 58 and 30, no cancer history. She had 10 siblings, all have passed except for her brother who she lives with. Her brother's family helps to take care of her. One of the patient's sisters had breast cancer. Another sister had cancer, unknown type.   Sherri [name] mother died at 75. Patient had 8 maternal aunts/uncles, no known cancers. A maternal cousin did die from cancer but she is unsure the type. She does not have information about maternal grandparents.  Sherri [name] father had cancer, unknown type. He passed at 26. Patient had 11 paternal aunts/uncles. An uncle had melanoma. No known cancers in paternal cousins. No information about paternal grandparents.  Sherri [name] is unaware of previous family history of genetic testing for hereditary cancer risks. Patient's maternal ancestors are of unknown descent, and paternal ancestors  are of unknown descent. There is no reported Ashkenazi Jewish ancestry. There is no known consanguinity.    GENETIC COUNSELING ASSESSMENT: Sherri [name] is a 86 y.o. female with a personal and family history of breast/ovarian cancer which is somewhat suggestive of a hereditary cancer syndrome and predisposition to cancer. We, therefore, discussed and recommended the following at today's visit.   DISCUSSION: We discussed that 15-25% of ovarian cancer is hereditary meaning that it is due to a mutation in a single gene that is passed down from generation to generation in a family. Most cases of hereditary ovarian cancer are associated with BRCA1/BRCA2 genes, although there are other genes associated with hereditary breast/ovarian cancer as well.  We discussed that testing is beneficial for several reasons including knowing if an individual is a candidate for certain targeted therapies, knowing about other cancer risks, identifying potential screening and risk-reduction options that may be appropriate, and to understand if other family members could be at risk for cancer and allow them to undergo genetic testing.   We reviewed the characteristics, features and inheritance patterns of hereditary cancer syndromes. We also discussed genetic testing, including the appropriate family members to test, the process of testing, insurance coverage and turn-around-time for results. We discussed the implications of a negative, positive and/or variant of uncertain significant result. We recommended Sherri [name] pursue genetic testing for the [facility] panel.   The [facility] gene panel offered by [employer] includes sequencing and deletion/duplication testing of the following 35 genes: APC, ATM, AXIN2, BARD1, BMPR1A, BRCA1, BRCA2, BRIP1, CHD1, CDK4, CDKN2A, CHEK2, EPCAM (large rearrangement only), HOXB13, GALNT12, MLH1, MSH2, MSH3, MSH6, MUTYH, NBN, NTHL1, PALB2, PMS2, PTEN, RAD51C, RAD51D, RNF43,  RPS20, SMAD4, STK11, and TP53. Sequencing was performed for select regions of POLE and POLD1, and large rearrangement analysis was performed for select regions of GREM1.   The MyChoice test will analyze the biopsy of the tumor.  Based on Sherri [name]'s personal and family  history of cancer, she meets medical criteria for genetic testing.  Despite that she meets criteria, she may still have an out of pocket cost.   PLAN: After considering the risks, benefits, and limitations, Sherri [name] provided informed consent to pursue genetic testing and the blood sample was sent to [facility] for analysis of the [facility]+MyChoice test. Results should be available within approximately 6-8 weeks' time, at which point they will be disclosed by telephone to Sherri [name], as will any additional recommendations warranted by these results. Sherri [name] will receive a summary of her genetic counseling visit and a copy of her results once available. This information will also be available in Epic.   Sherri [name] questions were answered to her satisfaction today. Our contact information was provided should additional questions or concerns arise. Thank you for the referral and allowing [place] to share in the care of your patient.   Faith Rogue, MS, [facility] Genetic Counselor [email or web address] Phone: [phone]  The patient was seen for a total of 25 minutes in face-to-face genetic counseling.  Dr. [name] was available for discussion regarding this case.   _______________________________________________________________________ For Office Staff:  Number of people involved in session: 1 Was an Intern/ student involved with case: no

## 2020-01-17 ENCOUNTER — Telehealth: Payer: Self-pay

## 2020-01-17 ENCOUNTER — Ambulatory Visit: Payer: Medicare PPO

## 2020-01-17 LAB — CA 125: Cancer Antigen (CA) 125: 1028 U/mL — ABNORMAL HIGH (ref 0.0–38.1)

## 2020-01-17 NOTE — Telephone Encounter (Signed)
T/C to pt for follow up after receiving first chemo yesterday.   No answer but left message letting pt know we were calling to check on her and for her to call for any questions or concerns.

## 2020-01-18 ENCOUNTER — Inpatient Hospital Stay: Payer: Medicare PPO

## 2020-01-18 NOTE — Progress Notes (Deleted)
[place]  [phone] Fax:(336) [phone]  ID: Sherri [name] OB: 04-27-1933  MR#: [id number]  JQZ#:[id number]  Patient Care Team: [name], MD as PCP - General (Family Medicine) Lloyd Huger, MD as Consulting Physician (Oncology) Clent Jacks, RN as Oncology Nurse Navigator  CHIEF COMPLAINT: Stage IVb ovarian cancer.  INTERVAL HISTORY: Patient returns to clinic today for further evaluation and initiation of dose reduced carboplatin and Taxol.  She is highly anxious, but otherwise feels well.  She has no neurologic complaints.  She denies any recent fevers or illnesses.  She has a good appetite and denies weight loss.  She has no chest pain, cough, or hemoptysis.  She denies any abdominal pain.  She denies any nausea, vomiting, constipation, or diarrhea.  She has no urinary complaints.  Patient offers no further specific complaints today.  REVIEW OF SYSTEMS:   Review of Systems  Constitutional: Negative.  Negative for diaphoresis, fever, malaise/fatigue and weight loss.  Respiratory: Negative.  Negative for cough and shortness of breath.   Cardiovascular: Negative.  Negative for chest pain and leg swelling.  Gastrointestinal: Negative.  Negative for abdominal pain.  Genitourinary: Negative.  Negative for dysuria.  Musculoskeletal: Negative.  Negative for joint pain.  Skin: Negative.  Negative for rash.  Neurological: Negative.  Negative for dizziness, sensory change, weakness and headaches.  Psychiatric/Behavioral: The patient is nervous/anxious.     As per HPI. Otherwise, a complete review of systems is negative.  PAST MEDICAL HISTORY: Past Medical History:  Diagnosis Date  . Anxiety   . Arthritis   . Breast cancer ([place]) 2013   RIGHT lumpectomy  . Bursitis of elbow    RIGHT  . Cancer [facility]) 2013   right breast lumpectomy with a few nodes removed  . Chronic kidney disease    stage 3  . Colitis   . Coronary artery  disease   . Diverticulitis   . Dysrhythmia    according to anxiety, tachy  . Family history of breast cancer   . Family history of melanoma   . GERD (gastroesophageal reflux disease)    okay since took her gallbladder out  . H/O lumpectomy   . Heart disease   . History of breast cancer   . History of coronary artery stent placement 2007   one stent  . HOH (hard of hearing)   . Hypercholesteremia   . Hypertension   . Hypothyroidism   . Myocardial infarction ([place])    2007  . Osteopenia   . Palpitations   . Personal history of radiation therapy   . Skin cancer     PAST SURGICAL HISTORY: Past Surgical History:  Procedure Laterality Date  . BREAST LUMPECTOMY Right 2013   lymph node dissection  . CATARACT EXTRACTION W/PHACO Right 07/23/2015   Procedure: CATARACT EXTRACTION PHACO AND INTRAOCULAR LENS PLACEMENT (IOC);  Surgeon: Birder Robson, MD;  Location: [facility] ORS;  Service: Ophthalmology;  Laterality: Right;  [place] 01:24 AP% 26.7 CDE 22.47 fluid pack lot # [id number]  . CATARACT EXTRACTION W/PHACO Left 08/13/2015   Procedure: CATARACT EXTRACTION PHACO AND INTRAOCULAR LENS PLACEMENT (IOC);  Surgeon: Birder Robson, MD;  Location: [facility] ORS;  Service: Ophthalmology;  Laterality: Left;  [place] 02:40 AP% 30.7 CDE 49.28 fluid pack lot # [id number]  . CHOLECYSTECTOMY    . COLONOSCOPY    . CORONARY ANGIOPLASTY  2007  . EYE SURGERY     bilateral cataract  . HARDWARE REMOVAL Right 04/29/2016   Procedure: HARDWARE  REMOVAL;  Surgeon: Hessie Knows, MD;  Location: [facility] ORS;  Service: Orthopedics;  Laterality: Right;  . IR IMAGING GUIDED PORT INSERTION  01/12/2020  . ORIF ELBOW FRACTURE Right 10/01/2015   Procedure: OPEN REDUCTION INTERNAL FIXATION (ORIF) ELBOW/OLECRANON FRACTURE;  Surgeon: Hessie Knows, MD;  Location: [facility] ORS;  Service: Orthopedics;  Laterality: Right;  Marland Kitchen VIDEO BRONCHOSCOPY WITH ENDOBRONCHIAL NAVIGATION N/A 01/01/2020   Procedure: VIDEO BRONCHOSCOPY WITH ENDOBRONCHIAL NAVIGATION  WITH CELLZIZIO;  Surgeon: Tyler Pita, MD;  Location: [facility] ORS;  Service: Pulmonary;  Laterality: N/A;  . VIDEO BRONCHOSCOPY WITH ENDOBRONCHIAL ULTRASOUND N/A 01/01/2020   Procedure: VIDEO BRONCHOSCOPY WITH ENDOBRONCHIAL ULTRASOUND;  Surgeon: Tyler Pita, MD;  Location: [facility] ORS;  Service: Pulmonary;  Laterality: N/A;    FAMILY HISTORY: Reviewed and unchanged. No reported history of malignancy or chronic disease.     ADVANCED DIRECTIVES:    HEALTH MAINTENANCE: Social History   Tobacco Use  . Smoking status: Former Smoker    Packs/day: 0.25    Years: 2.00    Pack years: 0.50    Types: Cigarettes    Quit date: 12/12/1955    Years since quitting: 64.1  . Smokeless tobacco: Never Used  Vaping Use  . Vaping Use: Never used  Substance Use Topics  . Alcohol use: Yes    Comment: wine occasionally  . Drug use: No     Colonoscopy:  PAP:  Bone density:  Lipid panel:  Allergies  Allergen Reactions  . Prednisone Other (See Comments)    Mood alterations    Current Outpatient Medications  Medication Sig Dispense Refill  . acetaminophen (TYLENOL) 500 MG tablet Take 500 mg by mouth every 8 (eight) hours as needed for moderate pain.    Marland Kitchen ALPRAZolam (XANAX) 0.25 MG tablet Take 1 tablet (0.25 mg total) by mouth 2 (two) times daily as needed for anxiety. 60 tablet 0  . aspirin EC 81 MG tablet Take 81 mg by mouth at bedtime. Swallow whole.    . carvedilol (COREG) 3.125 MG tablet Take 3.125 mg by mouth 2 (two) times daily.     . Cholecalciferol (VITAMIN D-3 PO) Take 2,000 mg by mouth at bedtime.     . Coenzyme Q10 (CO Q 10 PO) Take 100 mg by mouth daily.    Arna Medici 25 MCG tablet Take 25 mcg by mouth daily before breakfast. 30 minutes to 1 hour prior to eating    . lidocaine-prilocaine (EMLA) cream Apply to affected area once 30 g 3  . lisinopril (PRINIVIL,ZESTRIL) 10 MG tablet Take 10 mg by mouth in the morning and at bedtime.     . mirtazapine (REMERON) 7.5 MG tablet Take  1 tablet (7.5 mg total) by mouth at bedtime. 90 tablet 3  . nitroGLYCERIN (NITROSTAT) 0.4 MG SL tablet Place 0.4 mg under the tongue every 5 (five) minutes x 3 doses as needed for chest pain.     Marland Kitchen ondansetron (ZOFRAN) 8 MG tablet Take 1 tablet (8 mg total) by mouth 2 (two) times daily as needed for refractory nausea / vomiting. 60 tablet 2  . prochlorperazine (COMPAZINE) 10 MG tablet Take 1 tablet (10 mg total) by mouth every 6 (six) hours as needed (Nausea or vomiting). 60 tablet 2  . senna (SENOKOT) 8.6 MG tablet Take 1 tablet by mouth daily as needed for constipation.    . simvastatin (ZOCOR) 40 MG tablet Take 40 mg by mouth at bedtime.     No current facility-administered medications for this visit.  OBJECTIVE: There were no vitals filed for this visit.   There is no height or weight on file to calculate BMI.    ECOG FS:0 - Asymptomatic  General: Well-developed, well-nourished, no acute distress. Eyes: Pink conjunctiva, anicteric sclera. HEENT: Normocephalic, moist mucous membranes. Lungs: No audible wheezing or coughing. Heart: Regular rate and rhythm. Abdomen: Soft, nontender, no obvious distention. Musculoskeletal: No edema, cyanosis, or clubbing. Neuro: Alert, answering all questions appropriately. Cranial nerves grossly intact. Skin: No rashes or petechiae noted. Psych: Normal affect.   LAB RESULTS:  Lab Results  Component Value Date   NA 137 01/16/2020   K 4.2 01/16/2020   CL 106 01/16/2020   CO2 23 01/16/2020   GLUCOSE 121 (H) 01/16/2020   BUN 24 (H) 01/16/2020   CREATININE 1.38 (H) 01/16/2020   CALCIUM 8.9 01/16/2020   PROT 7.0 01/16/2020   ALBUMIN 3.7 01/16/2020   AST 18 01/16/2020   ALT 8 01/16/2020   ALKPHOS 49 01/16/2020   BILITOT 0.5 01/16/2020   GFRNONAA 35 (L) 01/16/2020   GFRAA 41 (L) 01/16/2020    Lab Results  Component Value Date   WBC 11.6 (H) 01/16/2020   NEUTROABS 8.1 (H) 01/16/2020   HGB 11.0 (L) 01/16/2020   HCT 33.8 (L) 01/16/2020    MCV 92.1 01/16/2020   PLT 209 01/16/2020     STUDIES: DG Chest Port 1 View  Result Date: 01/01/2020 CLINICAL DATA:  Status post biopsy. EXAM: PORTABLE CHEST 1 VIEW COMPARISON:  November 16, 2019. FINDINGS: The heart size and mediastinal contours are within normal limits. No pneumothorax or pleural effusion is noted. Multiple irregular nodular densities in reticular densities are noted throughout both lungs consistent with metastatic disease as described on prior CT scan. The visualized skeletal structures are unremarkable. IMPRESSION: Multiple irregular nodular densities and reticular densities are noted throughout both lungs consistent with metastatic disease as described on prior CT scan. Electronically Signed   By: Marijo Conception M.D.   On: 01/01/2020 15:43   DG C-Arm 1-60 Min-No Report  Result Date: 01/01/2020 Fluoroscopy was utilized by the requesting physician.  No radiographic interpretation.   IR IMAGING GUIDED PORT INSERTION  Result Date: 01/17/2020 CLINICAL DATA:  Ovarian neoplasm, needs durable venous access for planned treatment regimen EXAM: TUNNELED PORT CATHETER PLACEMENT WITH ULTRASOUND AND FLUOROSCOPIC GUIDANCE FLUOROSCOPY TIME:  0.1 minute; 0.31 mGy ANESTHESIA/SEDATION: Intravenous Fentanyl 19mcg and Versed 0.5mg  were administered as conscious sedation during continuous monitoring of the patient's level of consciousness and physiological / cardiorespiratory status by the radiology RN, with a total moderate sedation time of 32 minutes. TECHNIQUE: The procedure, risks, benefits, and alternatives were explained to the patient. Questions regarding the procedure were encouraged and answered. The patient understands and consents to the procedure. As antibiotic prophylaxis, cefazolin 2 g was ordered pre-procedure and administered intravenously within 1 hour of incision. Patency of the right IJ vein was confirmed with ultrasound with image documentation. An appropriate skin site was determined.  Skin site was marked. Region was prepped using maximum barrier technique including cap and mask, sterile gown, sterile gloves, large sterile sheet, and Chlorhexidine as cutaneous antisepsis. The region was infiltrated locally with 1% lidocaine. Under real-time ultrasound guidance, the right IJ vein was accessed with a 21 gauge micropuncture needle; the needle tip within the vein was confirmed with ultrasound image documentation. Needle was exchanged over a 018 guidewire for transitional dilator, and vascular measurement was performed. A small incision was made on the right anterior chest wall and a  subcutaneous pocket fashioned. The power-injectable port was positioned and its catheter tunneled to the right IJ dermatotomy site. The transitional dilator was exchanged over an Amplatz wire for a peel-away sheath, through which the port catheter, which had been trimmed to the appropriate length, was advanced and positioned under fluoroscopy with its tip at the cavoatrial junction. Spot chest radiograph confirms good catheter position and no pneumothorax. The port was flushed per protocol. The pocket was closed with deep interrupted and subcuticular continuous 3-0 Monocryl sutures. The incisions were covered with Dermabond then covered with a sterile dressing. The patient tolerated the procedure well. COMPLICATIONS: COMPLICATIONS None immediate IMPRESSION: Technically successful right IJ power-injectable port catheter placement. Ready for routine use. Electronically Signed   By: Lucrezia Europe M.D.   On: 01/17/2020 08:23    ASSESSMENT: Stage IVb ovarian cancer.  PLAN:    1.  Stage IVb ovarian cancer: Despite history of breast cancer, biopsy consistent with carcinoma of gynecologic origin.  PET scan results from December 11, 2019 reviewed independently with large right pelvic region, extensive retroperitoneal lymphadenopathy and bilateral pulmonary disease consistent with metastatic ovarian cancer.  CA-125 is significantly  elevated greater than 1000.  Appreciate gynecology oncology input.  We will proceed with cycle 1 of dose reduced carboplatinum and Taxol today.  Patient will also receive on pro-Neulasta support.  Patient has had port placement.  Return to clinic in 1 week for laboratory work and to assess her toleration of treatment and then again in 3 weeks for further evaluation and consideration of cycle 2.  Will reimage at the conclusion of cycle 4. 2.  History of breast cancer: Patient has a history of stage IIb ER/PR positive, upper inner quadrant right breast.  Previously we did not have access to patient's pathology report, her stage and location of breast were determined based on clinic notes.  She received 6 months of Arimidex between April 2013 and October 2013 prior to undergoing right breast lumpectomy. She reports she underwent adjuvant XRT, but did not receive adjuvant chemotherapy.  Patient completed 5 years of Arimidex in October 2018.  Her most recent mammogram on January 30, 2019 was reported as BI-RADS 1.   3.  Anxiety: We will include IV Ativan with patient's premeds for each treatment.  She was also given a prescription for Xanax as needed.  I spent a total of 30 minutes reviewing chart data, face-to-face evaluation with the patient, counseling and coordination of care as detailed above.    Patient expressed understanding and was in agreement with this plan. She also understands that She can call clinic at any time with any questions, concerns, or complaints.    Lloyd Huger, MD   01/18/2020 1:29 PM

## 2020-01-19 ENCOUNTER — Ambulatory Visit: Payer: Medicare PPO

## 2020-01-19 ENCOUNTER — Telehealth: Payer: Self-pay | Admitting: *Deleted

## 2020-01-19 ENCOUNTER — Telehealth: Payer: Self-pay

## 2020-01-19 NOTE — Telephone Encounter (Signed)
ONO has been reviewed by Dr. Patsey Berthold-- No significant O2 desats. Lot of artifacts.   Left message for patient.

## 2020-01-19 NOTE — Telephone Encounter (Signed)
[facility] patient niece called reporting that patient is staying nauseated and that the medicine she has is not working. Please advise. She also states patient had a call yesterday and that the patient could not understand what was being said so she would like to know what that was about.(Looks Like [place] had called her)  Please advise

## 2020-01-19 NOTE — Telephone Encounter (Signed)
Call returned to [facility] and advised of doctor response, She will call back Monday if she wants to be seen in [facility] or she doesn't go to ER

## 2020-01-19 NOTE — Telephone Encounter (Signed)
Spoke with patient's niece by phone. It is unclear what and how frequently patient is taking regarding her antiemetics. Niece thinks that patient accidentally took Senokot in place of her Zofran. I recommended that family obtain a pillbox and plan to schedule Zofran every 8 hours with use of Compazine Q6H as needed for breakthrough nausea. If that fails, could try rotating to olanzapine at bedtime.

## 2020-01-19 NOTE — Telephone Encounter (Signed)
Niece called back stating that nausea medicine has been given but patient will not eat or drink stating that she feels full, no longer nauseated. She reports that the patient abdominal is distended and swollen and that it is NOT her bowels She is asking what to do and requests a return call

## 2020-01-19 NOTE — Telephone Encounter (Signed)
Not much we can do on a Friday afternoon.  She may have to go to the ED if this continues to be an issue.  We can see her in [facility] on Monday.

## 2020-01-22 ENCOUNTER — Other Ambulatory Visit: Payer: Self-pay | Admitting: *Deleted

## 2020-01-22 ENCOUNTER — Inpatient Hospital Stay (HOSPITAL_BASED_OUTPATIENT_CLINIC_OR_DEPARTMENT_OTHER): Payer: Medicare PPO | Admitting: Nurse Practitioner

## 2020-01-22 ENCOUNTER — Inpatient Hospital Stay: Payer: Medicare PPO

## 2020-01-22 ENCOUNTER — Other Ambulatory Visit: Payer: Self-pay

## 2020-01-22 VITALS — BP 135/78 | HR 94 | Temp 97.0°F | Resp 18 | Wt 127.0 lb

## 2020-01-22 DIAGNOSIS — E46 Unspecified protein-calorie malnutrition: Secondary | ICD-10-CM | POA: Diagnosis not present

## 2020-01-22 DIAGNOSIS — K5669 Other partial intestinal obstruction: Secondary | ICD-10-CM | POA: Diagnosis not present

## 2020-01-22 DIAGNOSIS — R140 Abdominal distension (gaseous): Secondary | ICD-10-CM

## 2020-01-22 DIAGNOSIS — E860 Dehydration: Secondary | ICD-10-CM

## 2020-01-22 DIAGNOSIS — R110 Nausea: Secondary | ICD-10-CM | POA: Diagnosis not present

## 2020-01-22 DIAGNOSIS — C569 Malignant neoplasm of unspecified ovary: Secondary | ICD-10-CM | POA: Diagnosis not present

## 2020-01-22 DIAGNOSIS — K56609 Unspecified intestinal obstruction, unspecified as to partial versus complete obstruction: Secondary | ICD-10-CM | POA: Diagnosis not present

## 2020-01-22 LAB — CBC WITH DIFFERENTIAL/PLATELET
Abs Immature Granulocytes: 0 10*3/uL (ref 0.00–0.07)
Basophils Absolute: 0 10*3/uL (ref 0.0–0.1)
Basophils Relative: 0 %
Eosinophils Absolute: 0 10*3/uL (ref 0.0–0.5)
Eosinophils Relative: 0 %
HCT: 34.2 % — ABNORMAL LOW (ref 36.0–46.0)
Hemoglobin: 11.8 g/dL — ABNORMAL LOW (ref 12.0–15.0)
Immature Granulocytes: 0 %
Lymphocytes Relative: 23 %
Lymphs Abs: 1.2 10*3/uL (ref 0.7–4.0)
MCH: 30.6 pg (ref 26.0–34.0)
MCHC: 34.5 g/dL (ref 30.0–36.0)
MCV: 88.8 fL (ref 80.0–100.0)
Monocytes Absolute: 1.2 10*3/uL — ABNORMAL HIGH (ref 0.1–1.0)
Monocytes Relative: 24 %
Neutro Abs: 2.6 10*3/uL (ref 1.7–7.7)
Neutrophils Relative %: 53 %
Platelets: 54 10*3/uL — ABNORMAL LOW (ref 150–400)
RBC: 3.85 MIL/uL — ABNORMAL LOW (ref 3.87–5.11)
RDW: 14.4 % (ref 11.5–15.5)
Smear Review: NORMAL
WBC: 5 10*3/uL (ref 4.0–10.5)
nRBC: 0 % (ref 0.0–0.2)

## 2020-01-22 LAB — COMPREHENSIVE METABOLIC PANEL
ALT: 11 U/L (ref 0–44)
AST: 15 U/L (ref 15–41)
Albumin: 3.5 g/dL (ref 3.5–5.0)
Alkaline Phosphatase: 48 U/L (ref 38–126)
Anion gap: 11 (ref 5–15)
BUN: 60 mg/dL — ABNORMAL HIGH (ref 8–23)
CO2: 24 mmol/L (ref 22–32)
Calcium: 8.4 mg/dL — ABNORMAL LOW (ref 8.9–10.3)
Chloride: 96 mmol/L — ABNORMAL LOW (ref 98–111)
Creatinine, Ser: 1.51 mg/dL — ABNORMAL HIGH (ref 0.44–1.00)
GFR calc Af Amer: 36 mL/min — ABNORMAL LOW (ref >60)
GFR calc non Af Amer: 31 mL/min — ABNORMAL LOW (ref >60)
Glucose, Bld: 163 mg/dL — ABNORMAL HIGH (ref 70–99)
Potassium: 3.8 mmol/L (ref 3.5–5.1)
Sodium: 131 mmol/L — ABNORMAL LOW (ref 135–145)
Total Bilirubin: 0.8 mg/dL (ref 0.3–1.2)
Total Protein: 6.8 g/dL (ref 6.5–8.1)

## 2020-01-22 LAB — MAGNESIUM: Magnesium: 1.9 mg/dL (ref 1.7–2.4)

## 2020-01-22 MED ORDER — SODIUM CHLORIDE 0.9 % IV SOLN
Freq: Once | INTRAVENOUS | Status: AC
  Filled 2020-01-22: qty 250

## 2020-01-22 MED ORDER — HEPARIN SOD (PORK) LOCK FLUSH 100 UNIT/ML IV SOLN
500.0000 [IU] | Freq: Once | INTRAVENOUS | Status: AC
  Administered 2020-01-22: 500 [IU] via INTRAVENOUS
  Filled 2020-01-22: qty 5

## 2020-01-22 NOTE — Progress Notes (Signed)
Symptom Management [place]  [phone] [phone]  Patient Care Team: [name], MD as PCP - General (Family Medicine) Lloyd Huger, MD as Consulting Physician (Oncology) Clent Jacks, RN as Oncology Nurse Navigator   Name of the patient: Sherri [name]  [id number]  03-Sep-1932   Date of visit: 01/22/20  Diagnosis- Metastatic Ovarian Cancer  Chief complaint/ Reason for visit- abdominal distention, poor appetite  Heme/Onc history:  Oncology History  Ovarian cancer ([place])  01/05/2020 Initial Diagnosis   Ovarian cancer ([place])   01/12/2020 Cancer Staging   Staging form: Ovary, Fallopian Tube, and Primary Peritoneal Carcinoma, AJCC 8th Edition - Clinical: FIGO Stage IVB (cTX, cN1b, pM1b) - Signed by Lloyd Huger, MD on 01/12/2020   01/16/2020 -  Chemotherapy   The patient had palonosetron (ALOXI) injection 0.25 mg, 0.25 mg, Intravenous,  Once, 1 of 6 cycles Administration: 0.25 mg (01/16/2020) pegfilgrastim (NEULASTA ONPRO KIT) injection 6 mg, 6 mg, Subcutaneous, Once, 1 of 6 cycles Administration: 6 mg (01/16/2020) CARBOplatin (PARAPLATIN) 210 mg in sodium chloride 0.9 % 250 mL chemo infusion, 210 mg (103.7 % of original dose 204.4 mg), Intravenous,  Once, 1 of 6 cycles Dose modification:   (original dose 204.4 mg, Cycle 1) Administration: 210 mg (01/16/2020) fosaprepitant (EMEND) 150 mg in sodium chloride 0.9 % 145 mL IVPB, 150 mg, Intravenous,  Once, 1 of 6 cycles Administration: 150 mg (01/16/2020) PACLitaxel (TAXOL) 180 mg in sodium chloride 0.9 % 250 mL chemo infusion (> 90m/m2), 110 mg/m2 = 180 mg (100 % of original dose 110 mg/m2), Intravenous,  Once, 1 of 6 cycles Dose modification: 110 mg/m2 (original dose 110 mg/m2, Cycle 1, Reason: Patient Age) Administration: 180 mg (01/16/2020)  for chemotherapy treatment.      Interval history-patient presents to symptom management clinic complaints of  generalized weakness nausea and abdominal fullness.  She says that her stomach feels swollen and distended.  It makes her back hurt.  She has been laying around more.  Feels anxious and nauseous.  Has not been taking antiemetics.  Moved her bowels this morning and it was soft.  No diarrhea or constipation.  No vomiting.  No fevers or chills.  Says she just feels very weak and tired.  Symptoms started after her first chemotherapy.  Nephew who accompanies her today contributes to history and says that she has sips and no meals.  Has been laying around.  No chest pain or shortness of breath.  ECOG FS:3 - Symptomatic, >50% confined to bed  Review of systems- Review of Systems  Constitutional: Positive for malaise/fatigue. Negative for chills, fever and weight loss.  HENT: Negative for hearing loss, nosebleeds, sore throat and tinnitus.   Eyes: Negative for blurred vision and double vision.  Respiratory: Negative for cough, hemoptysis, shortness of breath and wheezing.   Cardiovascular: Negative for chest pain, palpitations and leg swelling.  Gastrointestinal: Positive for abdominal pain and nausea. Negative for blood in stool, constipation, diarrhea, melena and vomiting.  Genitourinary: Negative for dysuria and urgency.  Musculoskeletal: Positive for back pain. Negative for falls, joint pain and myalgias.  Skin: Negative for itching and rash.  Neurological: Negative for dizziness, tingling, sensory change, loss of consciousness, weakness and headaches.  Endo/Heme/Allergies: Negative for environmental allergies. Does not bruise/bleed easily.  Psychiatric/Behavioral: Negative for depression. The patient is nervous/anxious. The patient does not have insomnia.      Current treatment-carbotaxol chemotherapy  Allergies  Allergen Reactions  . Prednisone Other (See Comments)  Mood alterations    Past Medical History:  Diagnosis Date  . Anxiety   . Arthritis   . Breast cancer ([place]) 2013   RIGHT  lumpectomy  . Bursitis of elbow    RIGHT  . Cancer [facility]) 2013   right breast lumpectomy with a few nodes removed  . Chronic kidney disease    stage 3  . Colitis   . Coronary artery disease   . Diverticulitis   . Dysrhythmia    according to anxiety, tachy  . Family history of breast cancer   . Family history of melanoma   . GERD (gastroesophageal reflux disease)    okay since took her gallbladder out  . H/O lumpectomy   . Heart disease   . History of breast cancer   . History of coronary artery stent placement 2007   one stent  . HOH (hard of hearing)   . Hypercholesteremia   . Hypertension   . Hypothyroidism   . Myocardial infarction ([place])    2007  . Osteopenia   . Palpitations   . Personal history of radiation therapy   . Skin cancer     Past Surgical History:  Procedure Laterality Date  . BREAST LUMPECTOMY Right 2013   lymph node dissection  . CATARACT EXTRACTION W/PHACO Right 07/23/2015   Procedure: CATARACT EXTRACTION PHACO AND INTRAOCULAR LENS PLACEMENT (IOC);  Surgeon: Birder Robson, MD;  Location: [facility] ORS;  Service: Ophthalmology;  Laterality: Right;  [place] 01:24 AP% 26.7 CDE 22.47 fluid pack lot # [id number]  . CATARACT EXTRACTION W/PHACO Left 08/13/2015   Procedure: CATARACT EXTRACTION PHACO AND INTRAOCULAR LENS PLACEMENT (IOC);  Surgeon: Birder Robson, MD;  Location: [facility] ORS;  Service: Ophthalmology;  Laterality: Left;  [place] 02:40 AP% 30.7 CDE 49.28 fluid pack lot # [id number]  . CHOLECYSTECTOMY    . COLONOSCOPY    . CORONARY ANGIOPLASTY  2007  . EYE SURGERY     bilateral cataract  . HARDWARE REMOVAL Right 04/29/2016   Procedure: HARDWARE REMOVAL;  Surgeon: Hessie Knows, MD;  Location: [facility] ORS;  Service: Orthopedics;  Laterality: Right;  . IR IMAGING GUIDED PORT INSERTION  01/12/2020  . ORIF ELBOW FRACTURE Right 10/01/2015   Procedure: OPEN REDUCTION INTERNAL FIXATION (ORIF) ELBOW/OLECRANON FRACTURE;  Surgeon: Hessie Knows, MD;  Location: [facility] ORS;   Service: Orthopedics;  Laterality: Right;  Marland Kitchen VIDEO BRONCHOSCOPY WITH ENDOBRONCHIAL NAVIGATION N/A 01/01/2020   Procedure: VIDEO BRONCHOSCOPY WITH ENDOBRONCHIAL NAVIGATION WITH CELLZIZIO;  Surgeon: Tyler Pita, MD;  Location: [facility] ORS;  Service: Pulmonary;  Laterality: N/A;  . VIDEO BRONCHOSCOPY WITH ENDOBRONCHIAL ULTRASOUND N/A 01/01/2020   Procedure: VIDEO BRONCHOSCOPY WITH ENDOBRONCHIAL ULTRASOUND;  Surgeon: Tyler Pita, MD;  Location: [facility] ORS;  Service: Pulmonary;  Laterality: N/A;    Social History   Socioeconomic History  . Marital status: Divorced    Spouse name: Not on file  . Number of children: Not on file  . Years of education: Not on file  . Highest education level: Not on file  Occupational History  . Not on file  Tobacco Use  . Smoking status: Former Smoker    Packs/day: 0.25    Years: 2.00    Pack years: 0.50    Types: Cigarettes    Quit date: 12/12/1955    Years since quitting: 64.1  . Smokeless tobacco: Never Used  Vaping Use  . Vaping Use: Never used  Substance and Sexual Activity  . Alcohol use: Yes    Comment: wine occasionally  .  Drug use: No  . Sexual activity: Not on file  Other Topics Concern  . Not on file  Social History Narrative  . Not on file   Social Determinants of Health   Financial Resource Strain:   . Difficulty of Paying Living Expenses: Not on file  Food Insecurity:   . Worried About Charity fundraiser in the Last Year: Not on file  . Ran Out of Food in the Last Year: Not on file  Transportation Needs:   . Lack of Transportation (Medical): Not on file  . Lack of Transportation (Non-Medical): Not on file  Physical Activity:   . Days of Exercise per Week: Not on file  . Minutes of Exercise per Session: Not on file  Stress:   . Feeling of Stress : Not on file  Social Connections:   . Frequency of Communication with Friends and Family: Not on file  . Frequency of Social Gatherings with Friends and Family: Not on file  .  Attends Religious Services: Not on file  . Active Member of Clubs or Organizations: Not on file  . Attends Archivist Meetings: Not on file  . Marital Status: Not on file  Intimate Partner Violence:   . Fear of Current or Ex-Partner: Not on file  . Emotionally Abused: Not on file  . Physically Abused: Not on file  . Sexually Abused: Not on file    Family History  Problem Relation Age of Onset  . Cancer Father        unsure type  . Breast cancer Sister 28  . Melanoma Paternal Uncle   . Cancer Sister        unsure type  . Cancer Cousin        unsure type     Current Outpatient Medications:  .  acetaminophen (TYLENOL) 500 MG tablet, Take 500 mg by mouth every 8 (eight) hours as needed for moderate pain., Disp: , Rfl:  .  ALPRAZolam (XANAX) 0.25 MG tablet, Take 1 tablet (0.25 mg total) by mouth 2 (two) times daily as needed for anxiety., Disp: 60 tablet, Rfl: 0 .  aspirin EC 81 MG tablet, Take 81 mg by mouth at bedtime. Swallow whole., Disp: , Rfl:  .  carvedilol (COREG) 3.125 MG tablet, Take 3.125 mg by mouth 2 (two) times daily. , Disp: , Rfl:  .  Cholecalciferol (VITAMIN D-3 PO), Take 2,000 mg by mouth at bedtime. , Disp: , Rfl:  .  Coenzyme Q10 (CO Q 10 PO), Take 100 mg by mouth daily., Disp: , Rfl:  .  EUTHYROX 25 MCG tablet, Take 25 mcg by mouth daily before breakfast. 30 minutes to 1 hour prior to eating, Disp: , Rfl:  .  lidocaine-prilocaine (EMLA) cream, Apply to affected area once, Disp: 30 g, Rfl: 3 .  lisinopril (PRINIVIL,ZESTRIL) 10 MG tablet, Take 10 mg by mouth in the morning and at bedtime. , Disp: , Rfl:  .  mirtazapine (REMERON) 7.5 MG tablet, Take 1 tablet (7.5 mg total) by mouth at bedtime., Disp: 90 tablet, Rfl: 3 .  nitroGLYCERIN (NITROSTAT) 0.4 MG SL tablet, Place 0.4 mg under the tongue every 5 (five) minutes x 3 doses as needed for chest pain. , Disp: , Rfl:  .  ondansetron (ZOFRAN) 8 MG tablet, Take 1 tablet (8 mg total) by mouth 2 (two) times daily  as needed for refractory nausea / vomiting., Disp: 60 tablet, Rfl: 2 .  prochlorperazine (COMPAZINE) 10 MG tablet, Take 1  tablet (10 mg total) by mouth every 6 (six) hours as needed (Nausea or vomiting)., Disp: 60 tablet, Rfl: 2 .  senna (SENOKOT) 8.6 MG tablet, Take 1 tablet by mouth daily as needed for constipation., Disp: , Rfl:  .  simvastatin (ZOCOR) 40 MG tablet, Take 40 mg by mouth at bedtime., Disp: , Rfl:   Physical exam:  Vitals:   01/22/20 1452  BP: 135/78  Pulse: 94  Resp: 18  Temp: (!) 97 F (36.1 C)  TempSrc: Tympanic  Weight: 127 lb (57.6 kg)   Physical Exam Constitutional:      Appearance: She is well-developed. She is ill-appearing (fatigued appearing).  HENT:     Head: Atraumatic.     Mouth/Throat:     Pharynx: No oropharyngeal exudate.  Eyes:     General: No scleral icterus.    Conjunctiva/sclera: Conjunctivae normal.  Cardiovascular:     Rate and Rhythm: Normal rate and regular rhythm.  Pulmonary:     Effort: Pulmonary effort is normal.     Breath sounds: Normal breath sounds.  Abdominal:     General: Bowel sounds are normal. There is distension (more distended than previous exams).     Palpations: Abdomen is soft.     Tenderness: There is abdominal tenderness. There is no right CVA tenderness, left CVA tenderness, guarding or rebound.  Musculoskeletal:        General: Normal range of motion.     Cervical back: Normal range of motion and neck supple.     Right lower leg: Edema present.     Comments: Laying in recliner  Skin:    General: Skin is warm and dry.  Neurological:     Mental Status: She is alert and oriented to person, place, and time.     Motor: No weakness.  Psychiatric:        Mood and Affect: Mood normal.        Behavior: Behavior normal.      CMP Latest Ref Rng & Units 01/16/2020  Glucose 70 - 99 mg/dL 121(H)  BUN 8 - 23 mg/dL 24(H)  Creatinine 0.44 - 1.00 mg/dL 1.38(H)  Sodium 135 - 145 mmol/L 137  Potassium 3.5 - 5.1 mmol/L  4.2  Chloride 98 - 111 mmol/L 106  CO2 22 - 32 mmol/L 23  Calcium 8.9 - 10.3 mg/dL 8.9  Total Protein 6.5 - 8.1 g/dL 7.0  Total Bilirubin 0.3 - 1.2 mg/dL 0.5  Alkaline Phos 38 - 126 U/L 49  AST 15 - 41 U/L 18  ALT 0 - 44 U/L 8   CBC Latest Ref Rng & Units 01/22/2020  WBC 4.0 - 10.5 K/uL 5.0  Hemoglobin 12.0 - 15.0 g/dL 11.8(L)  Hematocrit 36 - 46 % 34.2(L)  Platelets 150 - 400 K/uL 54(L)    No images are attached to the encounter.  DG Chest Port 1 View  Result Date: 01/01/2020 CLINICAL DATA:  Status post biopsy. EXAM: PORTABLE CHEST 1 VIEW COMPARISON:  November 16, 2019. FINDINGS: The heart size and mediastinal contours are within normal limits. No pneumothorax or pleural effusion is noted. Multiple irregular nodular densities in reticular densities are noted throughout both lungs consistent with metastatic disease as described on prior CT scan. The visualized skeletal structures are unremarkable. IMPRESSION: Multiple irregular nodular densities and reticular densities are noted throughout both lungs consistent with metastatic disease as described on prior CT scan. Electronically Signed   By: Marijo Conception M.D.   On: 01/01/2020 15:43  DG C-Arm 1-60 Min-No Report  Result Date: 01/01/2020 Fluoroscopy was utilized by the requesting physician.  No radiographic interpretation.   IR IMAGING GUIDED PORT INSERTION  Result Date: 01/17/2020 CLINICAL DATA:  Ovarian neoplasm, needs durable venous access for planned treatment regimen EXAM: TUNNELED PORT CATHETER PLACEMENT WITH ULTRASOUND AND FLUOROSCOPIC GUIDANCE FLUOROSCOPY TIME:  0.1 minute; 0.31 mGy ANESTHESIA/SEDATION: Intravenous Fentanyl 29mg and Versed 0.580mwere administered as conscious sedation during continuous monitoring of the patient's level of consciousness and physiological / cardiorespiratory status by the radiology RN, with a total moderate sedation time of 32 minutes. TECHNIQUE: The procedure, risks, benefits, and alternatives were  explained to the patient. Questions regarding the procedure were encouraged and answered. The patient understands and consents to the procedure. As antibiotic prophylaxis, cefazolin 2 g was ordered pre-procedure and administered intravenously within 1 hour of incision. Patency of the right IJ vein was confirmed with ultrasound with image documentation. An appropriate skin site was determined. Skin site was marked. Region was prepped using maximum barrier technique including cap and mask, sterile gown, sterile gloves, large sterile sheet, and Chlorhexidine as cutaneous antisepsis. The region was infiltrated locally with 1% lidocaine. Under real-time ultrasound guidance, the right IJ vein was accessed with a 21 gauge micropuncture needle; the needle tip within the vein was confirmed with ultrasound image documentation. Needle was exchanged over a 018 guidewire for transitional dilator, and vascular measurement was performed. A small incision was made on the right anterior chest wall and a subcutaneous pocket fashioned. The power-injectable port was positioned and its catheter tunneled to the right IJ dermatotomy site. The transitional dilator was exchanged over an Amplatz wire for a peel-away sheath, through which the port catheter, which had been trimmed to the appropriate length, was advanced and positioned under fluoroscopy with its tip at the cavoatrial junction. Spot chest radiograph confirms good catheter position and no pneumothorax. The port was flushed per protocol. The pocket was closed with deep interrupted and subcuticular continuous 3-0 Monocryl sutures. The incisions were covered with Dermabond then covered with a sterile dressing. The patient tolerated the procedure well. COMPLICATIONS: COMPLICATIONS None immediate IMPRESSION: Technically successful right IJ power-injectable port catheter placement. Ready for routine use. Electronically Signed   By: D Lucrezia Europe.D.   On: 01/17/2020 08:23     Assessment and plan- Patient is a [age].o. female diagnosed with stage IVb ovarian cancer currently s/p cycle 1 of carbo-taxol chemotherapy, dose reduced, who presents to symptom management clinic for abdominal pain, bloating, weakness and nausea   1. Abdominal bloating- etiology unclear. Question disease vs ascites. Will get [place] possible palliative paracentesis. If negative, likely disease burden vs bowel obstruction. Moving her bowels today but given metastatic ovarian cancer, high risk for obstruction secondary to malignancy. Discussed bowel prophylaxis and need to keep stool soft. Encouraged hydration, miralax, and senna.   2. Malnutrition & weakness- poor oral intake. Question anxiety & disease. Given chronic kidney disease, not active parenchyma, recommend gentle hydration of 500 ml NaCl for hydration. Will likely require additional fluids later this week. If ascites though, may worsen symptoms. Risks vs benefit discussion was had.   3. Nausea- recommend zofran 8 mg odt given poor oral intake. Recommend taking 30-45 minutes prior to eating to manage nausea and encourage intake.   4. Weakness- if symptoms don't improve with paracentesis, fluids, and chemotherapy, consider home health and/or home palliative care.   5. Goals of care- recommend integration of palliative care. Case discussed with JoBilley ChangNP  who will plan to see patient later this week as well. Follow up regarding anxiety. Not taking xanax which may help nausea as well.   Will see back in clinic on Wednesday for labs, [facility], and possible fluids. If symptoms do not improve or worsen in the interim, discussed ER vs return to clinic.   Visit Diagnosis 1. Malignant neoplasm of ovary, unspecified laterality ([place])   2. Abdominal distension   3. Protein-calorie malnutrition, unspecified severity ([place])   4. Nausea without vomiting     Patient expressed understanding and was in agreement with this plan. She also understands  that She can call clinic at any time with any questions, concerns, or complaints.   Thank you for allowing me to participate in the care of this very pleasant patient.   Beckey Rutter, DNP, AGNP-C [place] [facility] [place]

## 2020-01-22 NOTE — Progress Notes (Signed)
Pt received 529ml NS for hydration. Paracentesis ordered. I told pt's nephew that as soon as [place] calls me with her procedure time, I would call and let him know at phone # [phone]. They would most likely call me back tomorrow morning. We have cancelled her appt for tomm with Dr [name]. [facility] will see her on Weds for lab/ see NP and possible fluids. Pt agreeable to plan. Assisted pt to their vehicle via wheelchair.

## 2020-01-22 NOTE — Telephone Encounter (Signed)
Patient is aware of results and voiced her understanding.  Nothing further is needed.    

## 2020-01-22 NOTE — Progress Notes (Signed)
[facility] add on for Weakness, inability to eat, nausea, Abd fullness. Stomach is swollen, tender. She had to find a pair of pants to fit her. Complaining of back pain sitting in w/c. Moved to reclining with relief. Bowels are moving greenish yellow and brown soft, mushy. Very frail, pale and weak today. "Miserable".

## 2020-01-23 ENCOUNTER — Telehealth: Payer: Self-pay | Admitting: *Deleted

## 2020-01-23 ENCOUNTER — Inpatient Hospital Stay: Payer: Medicare PPO | Admitting: Oncology

## 2020-01-23 ENCOUNTER — Ambulatory Visit
Admission: RE | Admit: 2020-01-23 | Discharge: 2020-01-23 | Disposition: A | Discharge status: Home / Self Care | Payer: Medicare PPO | Source: Ambulatory Visit | Attending: Nurse Practitioner | Admitting: Nurse Practitioner

## 2020-01-23 ENCOUNTER — Inpatient Hospital Stay: Payer: Medicare PPO

## 2020-01-23 ENCOUNTER — Telehealth: Payer: Self-pay

## 2020-01-23 ENCOUNTER — Other Ambulatory Visit: Payer: Self-pay | Admitting: Nurse Practitioner

## 2020-01-23 DIAGNOSIS — C561 Malignant neoplasm of right ovary: Secondary | ICD-10-CM

## 2020-01-23 DIAGNOSIS — C569 Malignant neoplasm of unspecified ovary: Secondary | ICD-10-CM

## 2020-01-23 NOTE — Telephone Encounter (Signed)
[name] called asking about home health assistance for this patient who has an appointment tomorrow and states that her brother will be bringing patient to her appointment tomorrow and can speak with him at that time about it. She states patient lives with her 84 year old parents and they need help.

## 2020-01-23 NOTE — Telephone Encounter (Signed)
Pt has been scheduled for Paracentesis at 2:30pm. Pt to arrive at 2pm in [facility]. Check in at registration desk. Pt can drink and eat as tolerated prior to procedure. I called nephew Ray per his request from [facility] yesterday. He will get his aunt to that appt today.

## 2020-01-23 NOTE — Telephone Encounter (Signed)
Home health would be appropriate.

## 2020-01-24 ENCOUNTER — Inpatient Hospital Stay
Admission: EM | Admit: 2020-01-24 | Discharge: 2020-01-30 | DRG: 329 | Disposition: A | Discharge status: Home Health | Payer: Medicare PPO | Source: Ambulatory Visit | Attending: Internal Medicine | Admitting: Internal Medicine

## 2020-01-24 ENCOUNTER — Emergency Department: Payer: Medicare PPO

## 2020-01-24 ENCOUNTER — Inpatient Hospital Stay: Payer: Medicare PPO

## 2020-01-24 ENCOUNTER — Inpatient Hospital Stay (HOSPITAL_BASED_OUTPATIENT_CLINIC_OR_DEPARTMENT_OTHER): Payer: Medicare PPO | Admitting: Oncology

## 2020-01-24 ENCOUNTER — Other Ambulatory Visit: Payer: Self-pay

## 2020-01-24 VITALS — BP 85/52 | HR 80 | Temp 97.0°F | Resp 15

## 2020-01-24 DIAGNOSIS — Z66 Do not resuscitate: Secondary | ICD-10-CM | POA: Diagnosis not present

## 2020-01-24 DIAGNOSIS — K5289 Other specified noninfective gastroenteritis and colitis: Secondary | ICD-10-CM | POA: Diagnosis present

## 2020-01-24 DIAGNOSIS — R1084 Generalized abdominal pain: Secondary | ICD-10-CM

## 2020-01-24 DIAGNOSIS — N1831 Chronic kidney disease, stage 3a: Secondary | ICD-10-CM | POA: Diagnosis present

## 2020-01-24 DIAGNOSIS — K5669 Other partial intestinal obstruction: Principal | ICD-10-CM | POA: Diagnosis present

## 2020-01-24 DIAGNOSIS — K529 Noninfective gastroenteritis and colitis, unspecified: Secondary | ICD-10-CM | POA: Diagnosis not present

## 2020-01-24 DIAGNOSIS — K219 Gastro-esophageal reflux disease without esophagitis: Secondary | ICD-10-CM | POA: Diagnosis present

## 2020-01-24 DIAGNOSIS — E860 Dehydration: Secondary | ICD-10-CM

## 2020-01-24 DIAGNOSIS — C7801 Secondary malignant neoplasm of right lung: Secondary | ICD-10-CM | POA: Diagnosis present

## 2020-01-24 DIAGNOSIS — C562 Malignant neoplasm of left ovary: Secondary | ICD-10-CM | POA: Diagnosis present

## 2020-01-24 DIAGNOSIS — I252 Old myocardial infarction: Secondary | ICD-10-CM

## 2020-01-24 DIAGNOSIS — E43 Unspecified severe protein-calorie malnutrition: Secondary | ICD-10-CM

## 2020-01-24 DIAGNOSIS — Z8719 Personal history of other diseases of the digestive system: Secondary | ICD-10-CM

## 2020-01-24 DIAGNOSIS — M810 Age-related osteoporosis without current pathological fracture: Secondary | ICD-10-CM | POA: Diagnosis present

## 2020-01-24 DIAGNOSIS — I2510 Atherosclerotic heart disease of native coronary artery without angina pectoris: Secondary | ICD-10-CM | POA: Diagnosis present

## 2020-01-24 DIAGNOSIS — Z20822 Contact with and (suspected) exposure to covid-19: Secondary | ICD-10-CM | POA: Diagnosis present

## 2020-01-24 DIAGNOSIS — C561 Malignant neoplasm of right ovary: Secondary | ICD-10-CM | POA: Diagnosis present

## 2020-01-24 DIAGNOSIS — E538 Deficiency of other specified B group vitamins: Secondary | ICD-10-CM | POA: Diagnosis present

## 2020-01-24 DIAGNOSIS — C569 Malignant neoplasm of unspecified ovary: Secondary | ICD-10-CM

## 2020-01-24 DIAGNOSIS — Z923 Personal history of irradiation: Secondary | ICD-10-CM

## 2020-01-24 DIAGNOSIS — I10 Essential (primary) hypertension: Secondary | ICD-10-CM | POA: Diagnosis not present

## 2020-01-24 DIAGNOSIS — E039 Hypothyroidism, unspecified: Secondary | ICD-10-CM | POA: Diagnosis present

## 2020-01-24 DIAGNOSIS — N1339 Other hydronephrosis: Secondary | ICD-10-CM | POA: Diagnosis present

## 2020-01-24 DIAGNOSIS — C563 Malignant neoplasm of bilateral ovaries: Secondary | ICD-10-CM

## 2020-01-24 DIAGNOSIS — Z803 Family history of malignant neoplasm of breast: Secondary | ICD-10-CM

## 2020-01-24 DIAGNOSIS — D630 Anemia in neoplastic disease: Secondary | ICD-10-CM | POA: Diagnosis present

## 2020-01-24 DIAGNOSIS — Z6821 Body mass index (BMI) 21.0-21.9, adult: Secondary | ICD-10-CM

## 2020-01-24 DIAGNOSIS — Z955 Presence of coronary angioplasty implant and graft: Secondary | ICD-10-CM | POA: Diagnosis not present

## 2020-01-24 DIAGNOSIS — K56609 Unspecified intestinal obstruction, unspecified as to partial versus complete obstruction: Secondary | ICD-10-CM | POA: Insufficient documentation

## 2020-01-24 DIAGNOSIS — N183 Chronic kidney disease, stage 3 unspecified: Secondary | ICD-10-CM

## 2020-01-24 DIAGNOSIS — E872 Acidosis: Secondary | ICD-10-CM | POA: Diagnosis present

## 2020-01-24 DIAGNOSIS — K56699 Other intestinal obstruction unspecified as to partial versus complete obstruction: Secondary | ICD-10-CM | POA: Diagnosis not present

## 2020-01-24 DIAGNOSIS — Z853 Personal history of malignant neoplasm of breast: Secondary | ICD-10-CM

## 2020-01-24 DIAGNOSIS — I340 Nonrheumatic mitral (valve) insufficiency: Secondary | ICD-10-CM | POA: Diagnosis present

## 2020-01-24 DIAGNOSIS — Z87891 Personal history of nicotine dependence: Secondary | ICD-10-CM

## 2020-01-24 DIAGNOSIS — Z808 Family history of malignant neoplasm of other organs or systems: Secondary | ICD-10-CM | POA: Diagnosis not present

## 2020-01-24 DIAGNOSIS — N179 Acute kidney failure, unspecified: Secondary | ICD-10-CM | POA: Diagnosis present

## 2020-01-24 DIAGNOSIS — E876 Hypokalemia: Secondary | ICD-10-CM | POA: Diagnosis present

## 2020-01-24 DIAGNOSIS — Z515 Encounter for palliative care: Secondary | ICD-10-CM | POA: Diagnosis not present

## 2020-01-24 DIAGNOSIS — C7802 Secondary malignant neoplasm of left lung: Secondary | ICD-10-CM | POA: Diagnosis present

## 2020-01-24 DIAGNOSIS — R000 Tachycardia, unspecified: Secondary | ICD-10-CM

## 2020-01-24 DIAGNOSIS — I129 Hypertensive chronic kidney disease with stage 1 through stage 4 chronic kidney disease, or unspecified chronic kidney disease: Secondary | ICD-10-CM | POA: Diagnosis present

## 2020-01-24 LAB — CBC WITH DIFFERENTIAL/PLATELET
Abs Immature Granulocytes: 0.49 10*3/uL — ABNORMAL HIGH (ref 0.00–0.07)
Abs Immature Granulocytes: 1.01 10*3/uL — ABNORMAL HIGH (ref 0.00–0.07)
Basophils Absolute: 0 10*3/uL (ref 0.0–0.1)
Basophils Absolute: 0 10*3/uL (ref 0.0–0.1)
Basophils Relative: 0 %
Basophils Relative: 0 %
Eosinophils Absolute: 0 10*3/uL (ref 0.0–0.5)
Eosinophils Absolute: 0 10*3/uL (ref 0.0–0.5)
Eosinophils Relative: 0 %
Eosinophils Relative: 0 %
HCT: 29.8 % — ABNORMAL LOW (ref 36.0–46.0)
HCT: 32.2 % — ABNORMAL LOW (ref 36.0–46.0)
Hemoglobin: 10.4 g/dL — ABNORMAL LOW (ref 12.0–15.0)
Hemoglobin: 10.5 g/dL — ABNORMAL LOW (ref 12.0–15.0)
Immature Granulocytes: 2 %
Immature Granulocytes: 4 %
Lymphocytes Relative: 5 %
Lymphocytes Relative: 5 %
Lymphs Abs: 1.2 10*3/uL (ref 0.7–4.0)
Lymphs Abs: 1.3 10*3/uL (ref 0.7–4.0)
MCH: 30.9 pg (ref 26.0–34.0)
MCH: 30.3 pg (ref 26.0–34.0)
MCHC: 34.9 g/dL (ref 30.0–36.0)
MCHC: 32.6 g/dL (ref 30.0–36.0)
MCV: 88.4 fL (ref 80.0–100.0)
MCV: 92.8 fL (ref 80.0–100.0)
Monocytes Absolute: 1.7 10*3/uL — ABNORMAL HIGH (ref 0.1–1.0)
Monocytes Absolute: 1.9 10*3/uL — ABNORMAL HIGH (ref 0.1–1.0)
Monocytes Relative: 7 %
Monocytes Relative: 8 %
Neutro Abs: 19.7 10*3/uL — ABNORMAL HIGH (ref 1.7–7.7)
Neutro Abs: 19.9 10*3/uL — ABNORMAL HIGH (ref 1.7–7.7)
Neutrophils Relative %: 86 %
Neutrophils Relative %: 83 %
Platelets: 72 10*3/uL — ABNORMAL LOW (ref 150–400)
Platelets: 72 10*3/uL — ABNORMAL LOW (ref 150–400)
RBC: 3.37 MIL/uL — ABNORMAL LOW (ref 3.87–5.11)
RBC: 3.47 MIL/uL — ABNORMAL LOW (ref 3.87–5.11)
RDW: 14.4 % (ref 11.5–15.5)
RDW: 14.7 % (ref 11.5–15.5)
Smear Review: NORMAL
Smear Review: NORMAL
WBC Morphology: INCREASED
WBC: 23.2 10*3/uL — ABNORMAL HIGH (ref 4.0–10.5)
WBC: 24 10*3/uL — ABNORMAL HIGH (ref 4.0–10.5)
nRBC: 0 % (ref 0.0–0.2)
nRBC: 0 % (ref 0.0–0.2)

## 2020-01-24 LAB — COMPREHENSIVE METABOLIC PANEL
ALT: 11 U/L (ref 0–44)
ALT: 10 U/L (ref 0–44)
AST: 20 U/L (ref 15–41)
AST: 23 U/L (ref 15–41)
Albumin: 3.1 g/dL — ABNORMAL LOW (ref 3.5–5.0)
Albumin: 2.9 g/dL — ABNORMAL LOW (ref 3.5–5.0)
Alkaline Phosphatase: 61 U/L (ref 38–126)
Alkaline Phosphatase: 63 U/L (ref 38–126)
Anion gap: 15 (ref 5–15)
Anion gap: 16 — ABNORMAL HIGH (ref 5–15)
BUN: 83 mg/dL — ABNORMAL HIGH (ref 8–23)
BUN: 77 mg/dL — ABNORMAL HIGH (ref 8–23)
CO2: 22 mmol/L (ref 22–32)
CO2: 20 mmol/L — ABNORMAL LOW (ref 22–32)
Calcium: 8 mg/dL — ABNORMAL LOW (ref 8.9–10.3)
Calcium: 8 mg/dL — ABNORMAL LOW (ref 8.9–10.3)
Chloride: 96 mmol/L — ABNORMAL LOW (ref 98–111)
Chloride: 97 mmol/L — ABNORMAL LOW (ref 98–111)
Creatinine, Ser: 2.13 mg/dL — ABNORMAL HIGH (ref 0.44–1.00)
Creatinine, Ser: 2.07 mg/dL — ABNORMAL HIGH (ref 0.44–1.00)
GFR calc Af Amer: 24 mL/min — ABNORMAL LOW (ref >60)
GFR calc Af Amer: 25 mL/min — ABNORMAL LOW (ref >60)
GFR calc non Af Amer: 21 mL/min — ABNORMAL LOW (ref >60)
GFR calc non Af Amer: 21 mL/min — ABNORMAL LOW (ref >60)
Glucose, Bld: 94 mg/dL (ref 70–99)
Glucose, Bld: 84 mg/dL (ref 70–99)
Potassium: 3.4 mmol/L — ABNORMAL LOW (ref 3.5–5.1)
Potassium: 4 mmol/L (ref 3.5–5.1)
Sodium: 133 mmol/L — ABNORMAL LOW (ref 135–145)
Sodium: 133 mmol/L — ABNORMAL LOW (ref 135–145)
Total Bilirubin: 1 mg/dL (ref 0.3–1.2)
Total Bilirubin: 1.4 mg/dL — ABNORMAL HIGH (ref 0.3–1.2)
Total Protein: 6.3 g/dL — ABNORMAL LOW (ref 6.5–8.1)
Total Protein: 6 g/dL — ABNORMAL LOW (ref 6.5–8.1)

## 2020-01-24 LAB — URINALYSIS, COMPLETE (UACMP) WITH MICROSCOPIC
Bacteria, UA: NONE SEEN
Bilirubin Urine: NEGATIVE
Glucose, UA: NEGATIVE mg/dL
Ketones, ur: 20 mg/dL — AB
Nitrite: NEGATIVE
Protein, ur: 30 mg/dL — AB
Specific Gravity, Urine: 1.021 (ref 1.005–1.030)
WBC, UA: >50 WBC/hpf — ABNORMAL HIGH (ref 0–5)
pH: 5 (ref 5.0–8.0)

## 2020-01-24 LAB — CBC
HCT: 27.1 % — ABNORMAL LOW (ref 36.0–46.0)
Hemoglobin: 9.1 g/dL — ABNORMAL LOW (ref 12.0–15.0)
MCH: 31.1 pg (ref 26.0–34.0)
MCHC: 33.6 g/dL (ref 30.0–36.0)
MCV: 92.5 fL (ref 80.0–100.0)
Platelets: 74 10*3/uL — ABNORMAL LOW (ref 150–400)
RBC: 2.93 MIL/uL — ABNORMAL LOW (ref 3.87–5.11)
RDW: 14.8 % (ref 11.5–15.5)
WBC: 24.1 10*3/uL — ABNORMAL HIGH (ref 4.0–10.5)
nRBC: 0 % (ref 0.0–0.2)

## 2020-01-24 LAB — SARS CORONAVIRUS 2 BY RT PCR (HOSPITAL ORDER, PERFORMED IN ~~LOC~~ HOSPITAL LAB): SARS Coronavirus 2: NEGATIVE

## 2020-01-24 LAB — LIPASE, BLOOD: Lipase: 39 U/L (ref 11–51)

## 2020-01-24 LAB — LACTIC ACID, PLASMA: Lactic Acid, Venous: 1.3 mmol/L (ref 0.5–1.9)

## 2020-01-24 LAB — MAGNESIUM: Magnesium: 2.2 mg/dL (ref 1.7–2.4)

## 2020-01-24 MED ORDER — IOHEXOL 9 MG/ML PO SOLN
500.0000 mL | Freq: Once | ORAL | Status: AC | PRN
  Administered 2020-01-24: 500 mL via ORAL

## 2020-01-24 MED ORDER — LEVOTHYROXINE SODIUM 100 MCG/5ML IV SOLN: 12.5000 ug | Freq: Every day | INTRAVENOUS | Status: DC

## 2020-01-24 MED ORDER — PANTOPRAZOLE SODIUM 40 MG IV SOLR
40.0000 mg | INTRAVENOUS | Status: DC
  Administered 2020-01-24 – 2020-01-26 (×3): 40 mg via INTRAVENOUS
  Filled 2020-01-24 (×3): qty 40

## 2020-01-24 MED ORDER — DEXTROSE-NACL 5-0.9 % IV SOLN: INTRAVENOUS | Status: DC

## 2020-01-24 MED ORDER — LACTATED RINGERS IV BOLUS
1000.0000 mL | Freq: Once | INTRAVENOUS | Status: AC
  Administered 2020-01-24: 1000 mL via INTRAVENOUS

## 2020-01-24 MED ORDER — SODIUM CHLORIDE 0.9% FLUSH
10.0000 mL | INTRAVENOUS | Status: DC | PRN
  Administered 2020-01-24: 10 mL via INTRAVENOUS
  Filled 2020-01-24: qty 10

## 2020-01-24 MED ORDER — MORPHINE SULFATE (PF) 2 MG/ML IV SOLN
2.0000 mg | INTRAVENOUS | Status: DC | PRN
  Administered 2020-01-26: 2 mg via INTRAVENOUS
  Filled 2020-01-24: qty 1

## 2020-01-24 MED ORDER — HEPARIN SODIUM (PORCINE) 5000 UNIT/ML IJ SOLN
5000.0000 [IU] | Freq: Three times a day (TID) | INTRAMUSCULAR | Status: DC
  Administered 2020-01-24 – 2020-01-27 (×7): 5000 [IU] via SUBCUTANEOUS
  Filled 2020-01-24 (×7): qty 1

## 2020-01-24 MED ORDER — HEPARIN SOD (PORK) LOCK FLUSH 100 UNIT/ML IV SOLN
500.0000 [IU] | Freq: Once | INTRAVENOUS | Status: DC
  Filled 2020-01-24: qty 5

## 2020-01-24 MED ORDER — LIDOCAINE VISCOUS HCL 2 % MT SOLN
15.0000 mL | Freq: Once | OROMUCOSAL | Status: DC
  Filled 2020-01-24: qty 15

## 2020-01-24 MED ORDER — ONDANSETRON 8 MG PO TBDP: 8.0000 mg | ORAL_TABLET | Freq: Three times a day (TID) | ORAL | 0 refills | Status: DC | PRN

## 2020-01-24 MED ORDER — ONDANSETRON HCL 4 MG/2ML IJ SOLN
4.0000 mg | Freq: Four times a day (QID) | INTRAMUSCULAR | Status: DC | PRN
  Administered 2020-01-26: 4 mg via INTRAVENOUS

## 2020-01-24 MED ORDER — PIPERACILLIN-TAZOBACTAM 3.375 G IVPB
3.3750 g | Freq: Two times a day (BID) | INTRAVENOUS | Status: DC
  Administered 2020-01-24 – 2020-01-25 (×2): 3.375 g via INTRAVENOUS
  Filled 2020-01-24 (×2): qty 50

## 2020-01-24 MED ORDER — ONDANSETRON HCL 4 MG PO TABS: 4.0000 mg | ORAL_TABLET | Freq: Four times a day (QID) | ORAL | Status: DC | PRN

## 2020-01-24 MED ORDER — LEVOTHYROXINE SODIUM 100 MCG/5ML IV SOLN
12.5000 ug | Freq: Every day | INTRAVENOUS | Status: DC
  Administered 2020-01-25 – 2020-01-27 (×2): 12.5 ug via INTRAVENOUS
  Filled 2020-01-24 (×3): qty 5

## 2020-01-24 MED ORDER — SODIUM CHLORIDE 0.9 % IV SOLN
Freq: Once | INTRAVENOUS | Status: AC
  Filled 2020-01-24: qty 250

## 2020-01-24 NOTE — Consult Note (Addendum)
[facility] SURGICAL CONSULTATION NOTE (initial) - cpt: 99245   HISTORY OF PRESENT ILLNESS (HPI):  86 y.o. female presented to [facility] ED today for evaluation of abdominal. Patient, and her nephew at bedside, reports around a 3 day history of abdominal pain, distension, diarrhea, and weakness. She reports these symptoms have gradually worsened throughout the last few days. Nothing seems to make them any better. No associated fever, chills, cough, congestion CP, SOB, emesis, nausea, or urinary changes. She has a history of stage IV ovarian cancer for who she follow with Dr [name]. She had a port placed around two weeks ago and underwent her first cycle of carbotaxol on 08/17. She was also seen by gynecology oncology on 08/11 for this large right pelvic malignancy and they did not feel that there was any role for surgical resection given the severity of her tumor burden. Additionally, she has been previous evaluated by urology as well (Dr Diamantina Providence) on 07/15 as this mass is obstructing her distal right ureter and leading to significant right hydronephrosis. Unfortunately, it was felt there would be no benefit from stenting nor nephrostomy tube given the degree of significant atrophy seen in the right kidney. She initially presented to the symptoms management clinic this morning but was ultimately referred to the ED. Work up in our ED revealed a leukocytosis to 24K, acute on chronic renal failure with sCr - 2.07, no lactic acidosis, and CT Abdomen/Pelvis again showed known large right pelvic mass with right hydronephrosis and now with increasing colonic distension concerning for colonic obstruction.   Surgery is consulted by emergency medicine physician Dr. Vladimir Crofts, MD in this context for evaluation and management of colon obstruction secondary to right pelvic malignancy.  PAST MEDICAL HISTORY (PMH):  Past Medical History:  Diagnosis Date  . Anxiety   . Arthritis   . Breast cancer ([place])  2013   RIGHT lumpectomy  . Bursitis of elbow    RIGHT  . Cancer [facility]) 2013   right breast lumpectomy with a few nodes removed  . Chronic kidney disease    stage 3  . Colitis   . Coronary artery disease   . Diverticulitis   . Dysrhythmia    according to anxiety, tachy  . Family history of breast cancer   . Family history of melanoma   . GERD (gastroesophageal reflux disease)    okay since took her gallbladder out  . H/O lumpectomy   . Heart disease   . History of breast cancer   . History of coronary artery stent placement 2007   one stent  . HOH (hard of hearing)   . Hypercholesteremia   . Hypertension   . Hypothyroidism   . Myocardial infarction ([place])    2007  . Osteopenia   . Palpitations   . Personal history of radiation therapy   . Skin cancer      PAST SURGICAL HISTORY ([place]):  Past Surgical History:  Procedure Laterality Date  . BREAST LUMPECTOMY Right 2013   lymph node dissection  . CATARACT EXTRACTION W/PHACO Right 07/23/2015   Procedure: CATARACT EXTRACTION PHACO AND INTRAOCULAR LENS PLACEMENT (IOC);  Surgeon: Birder Robson, MD;  Location: [facility] ORS;  Service: Ophthalmology;  Laterality: Right;  [place] 01:24 AP% 26.7 CDE 22.47 fluid pack lot # [id number]  . CATARACT EXTRACTION W/PHACO Left 08/13/2015   Procedure: CATARACT EXTRACTION PHACO AND INTRAOCULAR LENS PLACEMENT (IOC);  Surgeon: Birder Robson, MD;  Location: [facility] ORS;  Service: Ophthalmology;  Laterality: Left;  [place] 02:40  AP% 30.7 CDE 49.28 fluid pack lot # [id number]  . CHOLECYSTECTOMY    . COLONOSCOPY    . CORONARY ANGIOPLASTY  2007  . EYE SURGERY     bilateral cataract  . HARDWARE REMOVAL Right 04/29/2016   Procedure: HARDWARE REMOVAL;  Surgeon: Hessie Knows, MD;  Location: [facility] ORS;  Service: Orthopedics;  Laterality: Right;  . IR IMAGING GUIDED PORT INSERTION  01/12/2020  . ORIF ELBOW FRACTURE Right 10/01/2015   Procedure: OPEN REDUCTION INTERNAL FIXATION (ORIF) ELBOW/OLECRANON FRACTURE;  Surgeon:  Hessie Knows, MD;  Location: [facility] ORS;  Service: Orthopedics;  Laterality: Right;  Marland Kitchen VIDEO BRONCHOSCOPY WITH ENDOBRONCHIAL NAVIGATION N/A 01/01/2020   Procedure: VIDEO BRONCHOSCOPY WITH ENDOBRONCHIAL NAVIGATION WITH CELLZIZIO;  Surgeon: Tyler Pita, MD;  Location: [facility] ORS;  Service: Pulmonary;  Laterality: N/A;  . VIDEO BRONCHOSCOPY WITH ENDOBRONCHIAL ULTRASOUND N/A 01/01/2020   Procedure: VIDEO BRONCHOSCOPY WITH ENDOBRONCHIAL ULTRASOUND;  Surgeon: Tyler Pita, MD;  Location: [facility] ORS;  Service: Pulmonary;  Laterality: N/A;     MEDICATIONS:  Prior to Admission medications   Medication Sig Start Date End Date Taking? Authorizing Provider  acetaminophen (TYLENOL) 500 MG tablet Take 500 mg by mouth every 8 (eight) hours as needed for moderate pain. Patient not taking: Reported on 01/24/2020    [provider]  ALPRAZolam Duanne Moron) 0.25 MG tablet Take 1 tablet (0.25 mg total) by mouth 2 (two) times daily as needed for anxiety. Patient not taking: Reported on 01/24/2020 01/16/20   Lloyd Huger, MD  aspirin EC 81 MG tablet Take 81 mg by mouth at bedtime. Swallow whole. Patient not taking: Reported on 01/24/2020    [provider]  carvedilol (COREG) 3.125 MG tablet Take 3.125 mg by mouth 2 (two) times daily.     [provider]  Cholecalciferol (VITAMIN D-3 PO) Take 2,000 mg by mouth at bedtime.  Patient not taking: Reported on 01/24/2020    [provider]  Coenzyme Q10 (CO Q 10 PO) Take 100 mg by mouth daily. Patient not taking: Reported on 01/24/2020    [provider]  EUTHYROX 25 MCG tablet Take 25 mcg by mouth daily before breakfast. 30 minutes to 1 hour prior to eating Patient not taking: Reported on 01/24/2020 11/10/19   [provider]  lidocaine-prilocaine (EMLA) cream Apply to affected area once 01/08/20   Lloyd Huger, MD  lisinopril (PRINIVIL,ZESTRIL) 10 MG tablet Take 10 mg by mouth in the morning and at bedtime.   Patient not taking: Reported on 01/24/2020    [provider]  mirtazapine (REMERON) 7.5 MG tablet Take 1 tablet (7.5 mg total) by mouth at bedtime. Patient not taking: Reported on 01/24/2020 01/16/20   Borders, Kirt Boys, NP  nitroGLYCERIN (NITROSTAT) 0.4 MG SL tablet Place 0.4 mg under the tongue every 5 (five) minutes x 3 doses as needed for chest pain.  Patient not taking: Reported on 01/24/2020 12/17/17   [provider]  ondansetron (ZOFRAN ODT) 8 MG disintegrating tablet Take 1 tablet (8 mg total) by mouth every 8 (eight) hours as needed for nausea or vomiting. 01/24/20   Verlon Au, NP  ondansetron (ZOFRAN) 8 MG tablet Take 1 tablet (8 mg total) by mouth 2 (two) times daily as needed for refractory nausea / vomiting. 01/08/20   Lloyd Huger, MD  prochlorperazine (COMPAZINE) 10 MG tablet Take 1 tablet (10 mg total) by mouth every 6 (six) hours as needed (Nausea or vomiting). Patient not taking: Reported on 01/24/2020 01/08/20  Lloyd Huger, MD  senna (SENOKOT) 8.6 MG tablet Take 1 tablet by mouth daily as needed for constipation. Patient not taking: Reported on 01/24/2020    [provider]  simvastatin (ZOCOR) 40 MG tablet Take 40 mg by mouth at bedtime. Patient not taking: Reported on 01/24/2020    [provider]     ALLERGIES:  Allergies  Allergen Reactions  . Prednisone Other (See Comments)    Mood alterations     SOCIAL HISTORY:  Social History   Socioeconomic History  . Marital status: Divorced    Spouse name: Not on file  . Number of children: Not on file  . Years of education: Not on file  . Highest education level: Not on file  Occupational History  . Not on file  Tobacco Use  . Smoking status: Former Smoker    Packs/day: 0.25    Years: 2.00    Pack years: 0.50    Types: Cigarettes    Quit date: 12/12/1955    Years since quitting: 64.1  . Smokeless tobacco: Never Used  Vaping Use  . Vaping Use: Never used   Substance and Sexual Activity  . Alcohol use: Yes    Comment: wine occasionally  . Drug use: No  . Sexual activity: Not on file  Other Topics Concern  . Not on file  Social History Narrative  . Not on file   Social Determinants of Health   Financial Resource Strain:   . Difficulty of Paying Living Expenses: Not on file  Food Insecurity:   . Worried About Charity fundraiser in the Last Year: Not on file  . Ran Out of Food in the Last Year: Not on file  Transportation Needs:   . Lack of Transportation (Medical): Not on file  . Lack of Transportation (Non-Medical): Not on file  Physical Activity:   . Days of Exercise per Week: Not on file  . Minutes of Exercise per Session: Not on file  Stress:   . Feeling of Stress : Not on file  Social Connections:   . Frequency of Communication with Friends and Family: Not on file  . Frequency of Social Gatherings with Friends and Family: Not on file  . Attends Religious Services: Not on file  . Active Member of Clubs or Organizations: Not on file  . Attends Archivist Meetings: Not on file  . Marital Status: Not on file  Intimate Partner Violence:   . Fear of Current or Ex-Partner: Not on file  . Emotionally Abused: Not on file  . Physically Abused: Not on file  . Sexually Abused: Not on file     FAMILY HISTORY:  Family History  Problem Relation Age of Onset  . Cancer Father        unsure type  . Breast cancer Sister 69  . Melanoma Paternal Uncle   . Cancer Sister        unsure type  . Cancer Cousin        unsure type      REVIEW OF SYSTEMS:  Review of Systems  Constitutional: Negative for chills and fever.  HENT: Negative for congestion and sore throat.   Respiratory: Negative for cough and shortness of breath.   Cardiovascular: Negative for chest pain and palpitations.  Gastrointestinal: Positive for abdominal pain and diarrhea. Negative for nausea and vomiting.  Neurological: Positive for weakness. Negative  for dizziness and headaches.  All other systems reviewed and are negative.  VITAL SIGNS:  Temp:  [97 F (36.1 C)-97.5 F (36.4 C)] 97.5 F (36.4 C) (08/25 1218) Pulse Rate:  [78-105] 94 (08/25 1518) Resp:  [15-23] 23 (08/25 1518) BP: (85-132)/(52-67) 132/58 (08/25 1518) SpO2:  [93 %-95 %] 93 % (08/25 1518) Weight:  [57.2 kg] 57.2 kg (08/25 1220)     Height: 5\' 4"  (162.6 cm) Weight: 57.2 kg BMI (Calculated): 21.62   INTAKE/OUTPUT:  No intake/output data recorded.  PHYSICAL EXAM:  Physical Exam Vitals and nursing note reviewed. Exam conducted with a chaperone present.  Constitutional:      General: She is not in acute distress.    Appearance: She is well-developed. She is not ill-appearing.     Comments: Patient resting in bed, nephew at bedside  HENT:     Head: Normocephalic and atraumatic.  Eyes:     General: No scleral icterus.    Extraocular Movements: Extraocular movements intact.  Cardiovascular:     Rate and Rhythm: Normal rate and regular rhythm.     Heart sounds: Normal heart sounds. No murmur heard.   Pulmonary:     Effort: Pulmonary effort is normal. No respiratory distress.     Breath sounds: Normal breath sounds.  Abdominal:     General: A surgical scar is present. There is distension.     Palpations: Abdomen is soft.     Tenderness: There is no abdominal tenderness. There is no guarding or rebound.     Comments: Abdomen appears soft but is markedly distended and tympanic to percussion throughout, I am unable to illicit any point tenderness, no rebound/guarding, I do not think she is peritonitic at this time  Genitourinary:    Comments: Deferred Skin:    General: Skin is warm and dry.     Coloration: Skin is not jaundiced or pale.  Neurological:     General: No focal deficit present.     Mental Status: She is alert and oriented to person, place, and time.  Psychiatric:        Mood and Affect: Mood normal.        Behavior: Behavior normal.       Labs:  CBC Latest Ref Rng & Units 01/24/2020 01/24/2020 01/22/2020  WBC 4.0 - 10.5 K/uL 24.0(H) 23.2(H) 5.0  Hemoglobin 12.0 - 15.0 g/dL 10.5(L) 10.4(L) 11.8(L)  Hematocrit 36 - 46 % 32.2(L) 29.8(L) 34.2(L)  Platelets 150 - 400 K/uL 72(L) 72(L) 54(L)   CMP Latest Ref Rng & Units 01/24/2020 01/24/2020 01/22/2020  Glucose 70 - 99 mg/dL 84 94 163(H)  BUN 8 - 23 mg/dL 77(H) 83(H) 60(H)  Creatinine 0.44 - 1.00 mg/dL 2.07(H) 2.13(H) 1.51(H)  Sodium 135 - 145 mmol/L 133(L) 133(L) 131(L)  Potassium 3.5 - 5.1 mmol/L 4.0 3.4(L) 3.8  Chloride 98 - 111 mmol/L 97(L) 96(L) 96(L)  CO2 22 - 32 mmol/L 20(L) 22 24  Calcium 8.9 - 10.3 mg/dL 8.0(L) 8.0(L) 8.4(L)  Total Protein 6.5 - 8.1 g/dL 6.0(L) 6.3(L) 6.8  Total Bilirubin 0.3 - 1.2 mg/dL 1.4(H) 1.0 0.8  Alkaline Phos 38 - 126 U/L 63 61 48  AST 15 - 41 U/L 23 20 15   ALT 0 - 44 U/L 10 11 11      Imaging studies:   CT Abdomen/Pelvis (01/24/2020) personally reviewed showing large right pelvic mass with known history of ovarian cancer which appears to be near obstructing colon and completely obstructing right ureter with associated hydronephrosis, no pneumoperitoneum or pneumatosis, and radiologist report reviewed:  IMPRESSION: 1. Distal colonic obstruction  associated with colitis presumably due to metastatic disease to the surface of the sigmoid colon. Signs of colitis with bowel distension of the colon up to 8 cm. GI and or surgical consultation may be helpful. 2. Mass in the RIGHT hemipelvis obstructs the RIGHT ureter. Chronic thinning of the RIGHT renal cortex in the setting of severe RIGHT-sided hydro ureteral nephrosis. Mass also involves the RIGHT urinary bladder, the uterus and tethers small-bowel loops in the pelvis and adjacent sigmoid colon. 3. Extension of mass into the RIGHT pelvic sidewall as described. 4. Findings that suggest more diffuse peritoneal involvement. A component of the ascitic fluid could also be due to  colonic inflammation. 5. Evidence of pulmonary metastatic disease and interstitial lung disease. 6. Signs of pulmonary metastatic disease and interstitial lung disease. 7. Aortic atherosclerosis.   Assessment/Plan: (ICD-10's: C56.1) 86 y.o. female with increasing abdominal distension concerning for colonic obstruction secondary to large right pelvic malignancy (stage IV ovarian CA) which is now compressing the distal colon.   - This is a very unfortunate situation with very progressive pelvic ovarian malignancy. I had a very long discussion regarding the CT findings today and concern for colonic obstruction and what that means with the patient and her nephew. Fortunately, she is without any evidence of pneumoperitoneum, pneumatosis, nor peritonitis on exam or imaging; however, my fear is that she will ultimately progress to this point without intervention. I gave them two options....    Option 1 would be undergoing loop diverting colostomy this admission to relieve obstruction and hopefully prevent perforation, this would almost certainly be a permanent colostomy, and would only be palliative in nature and in no way treat her underlying mass.    Option 2 would be doing nothing and only focusing on palliative and comfort measure understanding this would ultimately be a terminal event. Unfortunately there are no other reasonable and safe ways to try and decompress her colon as NGT does not reduce colonic distension and I fear a rectal tube would have high risk of perforating colon in this setting   After some discussion with the patient and the family, I believe they would like to pursue diverting loop colostomy again with the understanding this will likely be permanent and only palliative in nature. I do not think she is in any imminent danger at the moment nor with any evidence of peritonitis or bowel compromise. Tentatively, we can plan for this procedure on Friday with Dr Christian Mate pending  OR/Anesthesia availability. In the interim....  - We should involve palliative care to help family discuss realistic expectations and establish goals of care, which they seemed interested in doing  - Closely monitor abdominal examination; on-going bowel function  - I will get serial KUBs to ensure no worsening in colonic distension  - I appreciate medicine admission; we will closely follow    All of the above findings and recommendations were discussed with the patient and her nephew, and all of their questions were answered to their expressed satisfaction.  Thank you for the opportunity to participate in this patient's care.   Face-to-face time spent with the patient and care providers was 90 minutes, with more than 50% of the time spent counseling, educating, and coordinating care of the patient.    -- Edison Simon, PA-C [facility] 01/24/2020, 3:30 PM [phone] M-F: 7am - 4pm

## 2020-01-24 NOTE — Progress Notes (Signed)
Symptom Management Consult note [facility]  [phone] [phone]  Patient Care Team: [name], MD as PCP - General (Family Medicine) Lloyd Huger, MD as Consulting Physician (Oncology) Clent Jacks, RN as Oncology Nurse Navigator   Name of the patient: Sherri [name]  [id number]  [birth year]   Date of visit: 01/24/2020   Diagnosis-stage IVb ovarian cancer  Chief complaint/ Reason for visit-abdominal distention/pain  Heme/Onc history:  Oncology History  Ovarian cancer ([place])  01/05/2020 Initial Diagnosis   Ovarian cancer ([place])   01/12/2020 Cancer Staging   Staging form: Ovary, Fallopian Tube, and Primary Peritoneal Carcinoma, AJCC 8th Edition - Clinical: FIGO Stage IVB (cTX, cN1b, pM1b) - Signed by Lloyd Huger, MD on 01/12/2020   01/16/2020 -  Chemotherapy   The patient had palonosetron (ALOXI) injection 0.25 mg, 0.25 mg, Intravenous,  Once, 1 of 6 cycles Administration: 0.25 mg (01/16/2020) pegfilgrastim (NEULASTA ONPRO KIT) injection 6 mg, 6 mg, Subcutaneous, Once, 1 of 6 cycles Administration: 6 mg (01/16/2020) CARBOplatin (PARAPLATIN) 210 mg in sodium chloride 0.9 % 250 mL chemo infusion, 210 mg (103.7 % of original dose 204.4 mg), Intravenous,  Once, 1 of 6 cycles Dose modification:   (original dose 204.4 mg, Cycle 1) Administration: 210 mg (01/16/2020) fosaprepitant (EMEND) 150 mg in sodium chloride 0.9 % 145 mL IVPB, 150 mg, Intravenous,  Once, 1 of 6 cycles Administration: 150 mg (01/16/2020) PACLitaxel (TAXOL) 180 mg in sodium chloride 0.9 % 250 mL chemo infusion (> 75m/m2), 110 mg/m2 = 180 mg (100 % of original dose 110 mg/m2), Intravenous,  Once, 1 of 6 cycles Dose modification: 110 mg/m2 (original dose 110 mg/m2, Cycle 1, Reason: Patient Age) Administration: 180 mg (01/16/2020)  for chemotherapy treatment.      Interval history-Sherri [name] an 86year old female with past medical history  significant for hypertension, CAD, mitral insufficiency, GERD, osteoporosis, history of breast cancer status post lumpectomy- XRT and AI x6 months and high cholesterol who was recently diagnosed with stage IV ovarian cancer.  She is followed by Dr. FGrayland Ormondand has received 1 cycle of carbotaxol on 01/16/2020.   She was seen by gynecology oncology on 01/10/2020 for evaluation/second opinion.  They agreed with carbo/Taxol.  Did not feel that surgery was an option at this time.  There were some concerns for a bowel obstruction.   She was evaluated by Dr. SCaprice Beaverfor hydroureteronephrosis but he did not feel there was a role for ureteral stent or nephrostomy tube and did not feel likely to have improvement in her renal function given that the right kidney was nearly completely atrophic.  She was seen in symptom management on Monday, 01/22/2020 for weakness, anorexia, nausea and abdominal fullness.  She received half a liter of normal saline and a paracentesis was ordered given her abdominal bloating.  Ultrasound did not reveal any evidence of ascites.  Labs were fairly unremarkable except for thrombocytopenia secondary to treatment and slight bump in creatinine with mild hyponatremia.  Today she reports back with her grandson with persistent and worsening abdominal distention, anorexia, profound weakness and lethargy.  Grandson states symptoms started on Sunday and have been notably worse Tuesday and this morning.  Patient states that she "feels full" all the time and does not want to eat anything.  She denies any nausea at this time.  Denies fever or illness.  Denies any respiratory concerns.  Reports constant "oozing" bowel movements.  She has not had a real bowel movement  in several days.  She has had very little to eat and drink since Sunday.  ECOG FS:2 - Symptomatic, <50% confined to bed  Review of systems- Review of Systems  Constitutional: Positive for malaise/fatigue. Negative for chills, fever and  weight loss.  HENT: Negative for congestion, ear pain and tinnitus.   Eyes: Negative.  Negative for blurred vision and double vision.  Respiratory: Negative.  Negative for cough, sputum production and shortness of breath.   Cardiovascular: Negative.  Negative for chest pain, palpitations and leg swelling.  Gastrointestinal: Positive for abdominal pain and nausea. Negative for constipation, diarrhea and vomiting.       Ozzing bowels  Genitourinary: Negative for dysuria, frequency and urgency.  Musculoskeletal: Negative for back pain and falls.  Skin: Negative.  Negative for rash.  Neurological: Positive for weakness. Negative for headaches.  Endo/Heme/Allergies: Negative.  Does not bruise/bleed easily.  Psychiatric/Behavioral: Negative for depression. The patient is nervous/anxious. The patient does not have insomnia.      Current treatment-  S/p 1 cycle Carbo/taxol  Allergies  Allergen Reactions  . Prednisone Other (See Comments)    Mood alterations     Past Medical History:  Diagnosis Date  . Anxiety   . Arthritis   . Breast cancer ([place]) 2013   RIGHT lumpectomy  . Bursitis of elbow    RIGHT  . Cancer [facility]) 2013   right breast lumpectomy with a few nodes removed  . Chronic kidney disease    stage 3  . Colitis   . Coronary artery disease   . Diverticulitis   . Dysrhythmia    according to anxiety, tachy  . Family history of breast cancer   . Family history of melanoma   . GERD (gastroesophageal reflux disease)    okay since took her gallbladder out  . H/O lumpectomy   . Heart disease   . History of breast cancer   . History of coronary artery stent placement 2007   one stent  . HOH (hard of hearing)   . Hypercholesteremia   . Hypertension   . Hypothyroidism   . Myocardial infarction ([place])    2007  . Osteopenia   . Palpitations   . Personal history of radiation therapy   . Skin cancer      Past Surgical History:  Procedure Laterality Date  . BREAST  LUMPECTOMY Right 2013   lymph node dissection  . CATARACT EXTRACTION W/PHACO Right 07/23/2015   Procedure: CATARACT EXTRACTION PHACO AND INTRAOCULAR LENS PLACEMENT (IOC);  Surgeon: Birder Robson, MD;  Location: [facility] ORS;  Service: Ophthalmology;  Laterality: Right;  [place] 01:24 AP% 26.7 CDE 22.47 fluid pack lot # [id number]  . CATARACT EXTRACTION W/PHACO Left 08/13/2015   Procedure: CATARACT EXTRACTION PHACO AND INTRAOCULAR LENS PLACEMENT (IOC);  Surgeon: Birder Robson, MD;  Location: [facility] ORS;  Service: Ophthalmology;  Laterality: Left;  [place] 02:40 AP% 30.7 CDE 49.28 fluid pack lot # [id number]  . CHOLECYSTECTOMY    . COLONOSCOPY    . CORONARY ANGIOPLASTY  2007  . EYE SURGERY     bilateral cataract  . HARDWARE REMOVAL Right 04/29/2016   Procedure: HARDWARE REMOVAL;  Surgeon: Hessie Knows, MD;  Location: [facility] ORS;  Service: Orthopedics;  Laterality: Right;  . IR IMAGING GUIDED PORT INSERTION  01/12/2020  . ORIF ELBOW FRACTURE Right 10/01/2015   Procedure: OPEN REDUCTION INTERNAL FIXATION (ORIF) ELBOW/OLECRANON FRACTURE;  Surgeon: Hessie Knows, MD;  Location: [facility] ORS;  Service: Orthopedics;  Laterality: Right;  Marland Kitchen VIDEO BRONCHOSCOPY  WITH ENDOBRONCHIAL NAVIGATION N/A 01/01/2020   Procedure: VIDEO BRONCHOSCOPY WITH ENDOBRONCHIAL NAVIGATION WITH CELLZIZIO;  Surgeon: Tyler Pita, MD;  Location: [facility] ORS;  Service: Pulmonary;  Laterality: N/A;  . VIDEO BRONCHOSCOPY WITH ENDOBRONCHIAL ULTRASOUND N/A 01/01/2020   Procedure: VIDEO BRONCHOSCOPY WITH ENDOBRONCHIAL ULTRASOUND;  Surgeon: Tyler Pita, MD;  Location: [facility] ORS;  Service: Pulmonary;  Laterality: N/A;    Social History   Socioeconomic History  . Marital status: Divorced    Spouse name: Not on file  . Number of children: Not on file  . Years of education: Not on file  . Highest education level: Not on file  Occupational History  . Not on file  Tobacco Use  . Smoking status: Former Smoker    Packs/day: 0.25    Years: 2.00     Pack years: 0.50    Types: Cigarettes    Quit date: 12/12/1955    Years since quitting: 64.1  . Smokeless tobacco: Never Used  Vaping Use  . Vaping Use: Never used  Substance and Sexual Activity  . Alcohol use: Yes    Comment: wine occasionally  . Drug use: No  . Sexual activity: Not on file  Other Topics Concern  . Not on file  Social History Narrative  . Not on file   Social Determinants of Health   Financial Resource Strain:   . Difficulty of Paying Living Expenses: Not on file  Food Insecurity:   . Worried About Charity fundraiser in the Last Year: Not on file  . Ran Out of Food in the Last Year: Not on file  Transportation Needs:   . Lack of Transportation (Medical): Not on file  . Lack of Transportation (Non-Medical): Not on file  Physical Activity:   . Days of Exercise per Week: Not on file  . Minutes of Exercise per Session: Not on file  Stress:   . Feeling of Stress : Not on file  Social Connections:   . Frequency of Communication with Friends and Family: Not on file  . Frequency of Social Gatherings with Friends and Family: Not on file  . Attends Religious Services: Not on file  . Active Member of Clubs or Organizations: Not on file  . Attends Archivist Meetings: Not on file  . Marital Status: Not on file  Intimate Partner Violence:   . Fear of Current or Ex-Partner: Not on file  . Emotionally Abused: Not on file  . Physically Abused: Not on file  . Sexually Abused: Not on file    Family History  Problem Relation Age of Onset  . Cancer Father        unsure type  . Breast cancer Sister 55  . Melanoma Paternal Uncle   . Cancer Sister        unsure type  . Cancer Cousin        unsure type     Current Outpatient Medications:  .  acetaminophen (TYLENOL) 500 MG tablet, Take 500 mg by mouth every 8 (eight) hours as needed for moderate pain. (Patient not taking: Reported on 01/24/2020), Disp: , Rfl:  .  ALPRAZolam (XANAX) 0.25 MG tablet, Take 1  tablet (0.25 mg total) by mouth 2 (two) times daily as needed for anxiety. (Patient not taking: Reported on 01/24/2020), Disp: 60 tablet, Rfl: 0 .  aspirin EC 81 MG tablet, Take 81 mg by mouth at bedtime. Swallow whole. (Patient not taking: Reported on 01/24/2020), Disp: , Rfl:  .  carvedilol (COREG) 3.125 MG tablet, Take 3.125 mg by mouth 2 (two) times daily. , Disp: , Rfl:  .  Cholecalciferol (VITAMIN D-3 PO), Take 2,000 mg by mouth at bedtime.  (Patient not taking: Reported on 01/24/2020), Disp: , Rfl:  .  Coenzyme Q10 (CO Q 10 PO), Take 100 mg by mouth daily. (Patient not taking: Reported on 01/24/2020), Disp: , Rfl:  .  EUTHYROX 25 MCG tablet, Take 25 mcg by mouth daily before breakfast. 30 minutes to 1 hour prior to eating (Patient not taking: Reported on 01/24/2020), Disp: , Rfl:  .  lidocaine-prilocaine (EMLA) cream, Apply to affected area once, Disp: 30 g, Rfl: 3 .  lisinopril (PRINIVIL,ZESTRIL) 10 MG tablet, Take 10 mg by mouth in the morning and at bedtime.  (Patient not taking: Reported on 01/24/2020), Disp: , Rfl:  .  mirtazapine (REMERON) 7.5 MG tablet, Take 1 tablet (7.5 mg total) by mouth at bedtime. (Patient not taking: Reported on 01/24/2020), Disp: 90 tablet, Rfl: 3 .  nitroGLYCERIN (NITROSTAT) 0.4 MG SL tablet, Place 0.4 mg under the tongue every 5 (five) minutes x 3 doses as needed for chest pain.  (Patient not taking: Reported on 01/24/2020), Disp: , Rfl:  .  ondansetron (ZOFRAN ODT) 8 MG disintegrating tablet, Take 1 tablet (8 mg total) by mouth every 8 (eight) hours as needed for nausea or vomiting., Disp: 30 tablet, Rfl: 0 .  ondansetron (ZOFRAN) 8 MG tablet, Take 1 tablet (8 mg total) by mouth 2 (two) times daily as needed for refractory nausea / vomiting., Disp: 60 tablet, Rfl: 2 .  prochlorperazine (COMPAZINE) 10 MG tablet, Take 1 tablet (10 mg total) by mouth every 6 (six) hours as needed (Nausea or vomiting). (Patient not taking: Reported on 01/24/2020), Disp: 60 tablet, Rfl: 2 .   senna (SENOKOT) 8.6 MG tablet, Take 1 tablet by mouth daily as needed for constipation. (Patient not taking: Reported on 01/24/2020), Disp: , Rfl:  .  simvastatin (ZOCOR) 40 MG tablet, Take 40 mg by mouth at bedtime. (Patient not taking: Reported on 01/24/2020), Disp: , Rfl:   Physical exam: There were no vitals filed for this visit. Physical Exam Constitutional:      Appearance: Normal appearance. She is ill-appearing.  HENT:     Head: Normocephalic and atraumatic.  Eyes:     Pupils: Pupils are equal, round, and reactive to light.  Cardiovascular:     Rate and Rhythm: Normal rate and regular rhythm.     Heart sounds: Normal heart sounds. No murmur heard.   Pulmonary:     Effort: Pulmonary effort is normal.     Breath sounds: Normal breath sounds. No wheezing.  Abdominal:     General: Bowel sounds are normal. There is distension.     Palpations: Abdomen is soft.     Tenderness: There is generalized abdominal tenderness.  Musculoskeletal:        General: Normal range of motion.     Cervical back: Normal range of motion.  Skin:    General: Skin is warm and dry.     Coloration: Skin is pale.     Findings: No rash.  Neurological:     Mental Status: She is alert and oriented to person, place, and time.  Psychiatric:        Judgment: Judgment normal.      CMP Latest Ref Rng & Units 01/24/2020  Glucose 70 - 99 mg/dL 84  BUN 8 - 23 mg/dL 77(H)  Creatinine 0.44 - 1.00  mg/dL 2.07(H)  Sodium 135 - 145 mmol/L 133(L)  Potassium 3.5 - 5.1 mmol/L 4.0  Chloride 98 - 111 mmol/L 97(L)  CO2 22 - 32 mmol/L 20(L)  Calcium 8.9 - 10.3 mg/dL 8.0(L)  Total Protein 6.5 - 8.1 g/dL 6.0(L)  Total Bilirubin 0.3 - 1.2 mg/dL 1.4(H)  Alkaline Phos 38 - 126 U/L 63  AST 15 - 41 U/L 23  ALT 0 - 44 U/L 10   CBC Latest Ref Rng & Units 01/24/2020  WBC 4.0 - 10.5 K/uL 24.0(H)  Hemoglobin 12.0 - 15.0 g/dL 10.5(L)  Hematocrit 36 - 46 % 32.2(L)  Platelets 150 - 400 K/uL 72(L)    No images are attached to  the encounter.  US Abdomen Limited  Result Date: 01/23/2020 CLINICAL DATA:  Ovarian cancer. Patient with abdominal distension. Evaluate for ascites. EXAM: LIMITED ABDOMEN ULTRASOUND FOR ASCITES TECHNIQUE: Limited ultrasound survey for ascites was performed in all four abdominal quadrants. COMPARISON:  PET-CT 12/11/2019 FINDINGS: No significant ascites. Marked dilatation of the right renal collecting system appears chronic and similar to the recent PET-CT. IMPRESSION: No evidence for ascites. Chronic right hydronephrosis. Electronically Signed   By: Markus Daft M.D.   On: 01/23/2020 15:48   DG Chest Port 1 View  Result Date: 01/01/2020 CLINICAL DATA:  Status post biopsy. EXAM: PORTABLE CHEST 1 VIEW COMPARISON:  November 16, 2019. FINDINGS: The heart size and mediastinal contours are within normal limits. No pneumothorax or pleural effusion is noted. Multiple irregular nodular densities in reticular densities are noted throughout both lungs consistent with metastatic disease as described on prior CT scan. The visualized skeletal structures are unremarkable. IMPRESSION: Multiple irregular nodular densities and reticular densities are noted throughout both lungs consistent with metastatic disease as described on prior CT scan. Electronically Signed   By: Marijo Conception M.D.   On: 01/01/2020 15:43   DG C-Arm 1-60 Min-No Report  Result Date: 01/01/2020 Fluoroscopy was utilized by the requesting physician.  No radiographic interpretation.   IR IMAGING GUIDED PORT INSERTION  Result Date: 01/17/2020 CLINICAL DATA:  Ovarian neoplasm, needs durable venous access for planned treatment regimen EXAM: TUNNELED PORT CATHETER PLACEMENT WITH ULTRASOUND AND FLUOROSCOPIC GUIDANCE FLUOROSCOPY TIME:  0.1 minute; 0.31 mGy ANESTHESIA/SEDATION: Intravenous Fentanyl 66mg and Versed 0.575mwere administered as conscious sedation during continuous monitoring of the patient's level of consciousness and physiological /  cardiorespiratory status by the radiology RN, with a total moderate sedation time of 32 minutes. TECHNIQUE: The procedure, risks, benefits, and alternatives were explained to the patient. Questions regarding the procedure were encouraged and answered. The patient understands and consents to the procedure. As antibiotic prophylaxis, cefazolin 2 g was ordered pre-procedure and administered intravenously within 1 hour of incision. Patency of the right IJ vein was confirmed with ultrasound with image documentation. An appropriate skin site was determined. Skin site was marked. Region was prepped using maximum barrier technique including cap and mask, sterile gown, sterile gloves, large sterile sheet, and Chlorhexidine as cutaneous antisepsis. The region was infiltrated locally with 1% lidocaine. Under real-time ultrasound guidance, the right IJ vein was accessed with a 21 gauge micropuncture needle; the needle tip within the vein was confirmed with ultrasound image documentation. Needle was exchanged over a 018 guidewire for transitional dilator, and vascular measurement was performed. A small incision was made on the right anterior chest wall and a subcutaneous pocket fashioned. The power-injectable port was positioned and its catheter tunneled to the right IJ dermatotomy site. The transitional dilator was exchanged  over an Amplatz wire for a peel-away sheath, through which the port catheter, which had been trimmed to the appropriate length, was advanced and positioned under fluoroscopy with its tip at the cavoatrial junction. Spot chest radiograph confirms good catheter position and no pneumothorax. The port was flushed per protocol. The pocket was closed with deep interrupted and subcuticular continuous 3-0 Monocryl sutures. The incisions were covered with Dermabond then covered with a sterile dressing. The patient tolerated the procedure well. COMPLICATIONS: COMPLICATIONS None immediate IMPRESSION: Technically  successful right IJ power-injectable port catheter placement. Ready for routine use. Electronically Signed   By: Lucrezia Europe M.D.   On: 01/17/2020 08:23   Assessment and plan- Patient is a 86 y.o. female who presents to symptom management for concerns of persistent abdominal pain, early satiety and bloating X 3 days.   Stage IV ovarian cancer-status post 1 cycle of carbo/Taxol with neulasta support.  There is some concern for a malignant bowel obstruction versus small bowel obstruction.  She will need imaging ASAP.  Unfortunately, she is not stable for outpatient management.   Labs today show elevated creatinine secondary to dehydration/decreased oral intake.  Leukocytosis secondary to on pro-Neulasta with treatment.  WAs hypotensive on arrival with a blood pressure of 80 systolically.  Abdomen is very distended and tight.  She was given 500 mL of normal saline while in clinic.  Plan: Transfer to the emergency room for further work-up with imaging.  Discussed at length with her grandson, option should this be a bowel obstruction.  Patient and grandson wishes to pursue imaging in the emergency room.  I personally called and spoke with the charge RN to arrange for patient's arrival.  Disposition: To be determined.   Visit Diagnosis 1. Malignant neoplasm of ovary, unspecified laterality [facility])     Patient expressed understanding and was in agreement with this plan. She also understands that She can call clinic at any time with any questions, concerns, or complaints.   Greater than 50% was spent in counseling and coordination of care with this patient including but not limited to discussion of the relevant topics above (See A&P) including, but not limited to diagnosis and management of acute and chronic medical conditions.   Thank you for allowing me to participate in the care of this very pleasant patient.    Jacquelin Hawking, NP [place] at [facility] Cell -  [phone] Pager- [phone] 01/24/2020 2:04 PM

## 2020-01-24 NOTE — ED Notes (Signed)
Ray pt's nephew [phone] requested to be called if pt's room is assigned.

## 2020-01-24 NOTE — ED Notes (Signed)
Pt c/o of heartburn at this time. Denies pain and NV. Admitting MD made aware at this time.

## 2020-01-24 NOTE — ED Triage Notes (Signed)
Pt here from the cancer center with abd pain and nausea. Pt has been seen x2 for the same complaint.Pt has stage 4 ovarian CA and has gotten 1 treatment so far. Pt does not have ascites but they are concerned about a SBO. Pt NAD in triage.

## 2020-01-24 NOTE — Progress Notes (Signed)
Acute add on. Pt remains unable to take in any food or fluids. Feels abd discomfort w/ fullness. Back pain. Very listless and lethargic. Very pale. 1 word responses. Needs full care with ADL's. Family not prepared to meet these needs. Becoming incontinent of bowel/bladder. Oozing stool. Giving 578ml NS at present.NP called report to ED. Wheeled pt to ED. RN spoke to [place] in Triage. Reported port remains accessed in right chest.

## 2020-01-24 NOTE — Progress Notes (Signed)
Pharmacy Antibiotic Note  Sherri [name] is a 86 y.o. female admitted on 01/24/2020 with intra abdominal infection.  Pharmacy has been consulted for zosyn dosing. CrCl = 17.5 ml/min  Plan: Zosyn 3.375 gm IV Q12H EI ordered.  Height: 5\' 4"  (162.6 cm) Weight: 57.2 kg (126 lb) IBW/kg (Calculated) : 54.7  Temp (24hrs), Avg:97.3 F (36.3 C), Min:97 F (36.1 C), Max:97.5 F (36.4 C)  Recent Labs  Lab 01/22/20 1424 01/24/20 1050 01/24/20 1237  WBC 5.0 23.2* 24.0*  CREATININE 1.51* 2.13* 2.07*  LATICACIDVEN  --   --  1.3    Estimated Creatinine Clearance: 17.5 mL/min (A) (by C-G formula based on SCr of 2.07 mg/dL (H)).    Allergies  Allergen Reactions  . Prednisone Other (See Comments)    Mood alterations    Antimicrobials this admission:   >>    >>   Dose adjustments this admission:   Microbiology results:  BCx:   UCx:    Sputum:    MRSA PCR:   Thank you for allowing pharmacy to be a part of this patient's care.  Zarif Rathje D 01/24/2020 7:34 PM

## 2020-01-24 NOTE — ED Notes (Signed)
Attempted to obtain urine sample at this time. Pt ambulatory to the restroom with standby assistance but missed the hat.

## 2020-01-24 NOTE — ED Notes (Signed)
Spoke with Olean Ree, [place]. Requested not to put in an NG tube at this time due to the placement of the obstruction. Pt denies NV at this time.

## 2020-01-24 NOTE — H&P (Signed)
History and Physical    Sherri [name] QJJ:[id number] DOB: 1932-12-05 DOA: 01/24/2020  PCP: [name], MD   Patient coming from: Home   Chief Complaint: abdominal pain  HPI: Sherri [name] is a 86 y.o. female with medical history significant of recently diagnosed ovarian cancer 8/6 2021, she started chemotherapy on 8/17.  For the following 2 days after her chemotherapy she had nausea and vomiting that improved with antiemetics.   For the last 6 days patient has been experiencing worsening abdominal distention and pain, moderate to severe in intensity, associated with watery stool incontinence, generalized weakness, no improving or worsening factors, she does not recall when she stopped feeling flatus. She was seen as an outpatient by urology, for hydroureteronephrosis, found not to be candidate for surgical intervention. On 01/22/2020 she had a follow-up with oncology, she received 1 L of normal saline and had further work-up with abdominal ultrasound which was negative for ascites.  Due to persistent symptoms she was brought to the hospital.   ED Course: She was found to be very dehydrated, further work-up revealed colonic obstruction associated with colitis due to metastatic disease to the surface of the sigmoid colon. Surgery was consulted with recommendations for admission and conservative medical therapy.  Review of Systems:  1. General: Generalized weakness, poor appetite, generalized malaise. 2. ENT: No runny nose or sore throat, no hearing disturbances 3. Pulmonary: No dyspnea, cough, wheezing, or hemoptysis 4. Cardiovascular: No angina, claudication, lower extremity edema, pnd or orthopnea 5. Gastrointestinal: Positive, nausea, vomiting abdominal distention, stool incontinence diarrhea 6. Hematology: No easy bruisability or frequent infections 7. Urology: No dysuria, hematuria or increased urinary frequency 8. Dermatology: No rashes. 9. Neurology: No seizures or  paresthesias 10. Musculoskeletal: No joint pain or deformities  Past Medical History:  Diagnosis Date  . Anxiety   . Arthritis   . Breast cancer ([place]) 2013   RIGHT lumpectomy  . Bursitis of elbow    RIGHT  . Cancer [facility]) 2013   right breast lumpectomy with a few nodes removed  . Chronic kidney disease    stage 3  . Colitis   . Coronary artery disease   . Diverticulitis   . Dysrhythmia    according to anxiety, tachy  . Family history of breast cancer   . Family history of melanoma   . GERD (gastroesophageal reflux disease)    okay since took her gallbladder out  . H/O lumpectomy   . Heart disease   . History of breast cancer   . History of coronary artery stent placement 2007   one stent  . HOH (hard of hearing)   . Hypercholesteremia   . Hypertension   . Hypothyroidism   . Myocardial infarction ([place])    2007  . Osteopenia   . Palpitations   . Personal history of radiation therapy   . Skin cancer     Past Surgical History:  Procedure Laterality Date  . BREAST LUMPECTOMY Right 2013   lymph node dissection  . CATARACT EXTRACTION W/PHACO Right 07/23/2015   Procedure: CATARACT EXTRACTION PHACO AND INTRAOCULAR LENS PLACEMENT (IOC);  Surgeon: Birder Robson, MD;  Location: [facility] ORS;  Service: Ophthalmology;  Laterality: Right;  [place] 01:24 AP% 26.7 CDE 22.47 fluid pack lot # [id number]  . CATARACT EXTRACTION W/PHACO Left 08/13/2015   Procedure: CATARACT EXTRACTION PHACO AND INTRAOCULAR LENS PLACEMENT (IOC);  Surgeon: Birder Robson, MD;  Location: [facility] ORS;  Service: Ophthalmology;  Laterality: Left;  [place] 02:40 AP% 30.7 CDE 49.28  fluid pack lot # [id number]  . CHOLECYSTECTOMY    . COLONOSCOPY    . CORONARY ANGIOPLASTY  2007  . EYE SURGERY     bilateral cataract  . HARDWARE REMOVAL Right 04/29/2016   Procedure: HARDWARE REMOVAL;  Surgeon: Hessie Knows, MD;  Location: [facility] ORS;  Service: Orthopedics;  Laterality: Right;  . IR IMAGING GUIDED PORT INSERTION  01/12/2020  .  ORIF ELBOW FRACTURE Right 10/01/2015   Procedure: OPEN REDUCTION INTERNAL FIXATION (ORIF) ELBOW/OLECRANON FRACTURE;  Surgeon: Hessie Knows, MD;  Location: [facility] ORS;  Service: Orthopedics;  Laterality: Right;  Marland Kitchen VIDEO BRONCHOSCOPY WITH ENDOBRONCHIAL NAVIGATION N/A 01/01/2020   Procedure: VIDEO BRONCHOSCOPY WITH ENDOBRONCHIAL NAVIGATION WITH CELLZIZIO;  Surgeon: Tyler Pita, MD;  Location: [facility] ORS;  Service: Pulmonary;  Laterality: N/A;  . VIDEO BRONCHOSCOPY WITH ENDOBRONCHIAL ULTRASOUND N/A 01/01/2020   Procedure: VIDEO BRONCHOSCOPY WITH ENDOBRONCHIAL ULTRASOUND;  Surgeon: Tyler Pita, MD;  Location: [facility] ORS;  Service: Pulmonary;  Laterality: N/A;     reports that she quit smoking about 64 years ago. Her smoking use included cigarettes. She has a 0.50 pack-year smoking history. She has never used smokeless tobacco. She reports current alcohol use. She reports that she does not use drugs.  Allergies  Allergen Reactions  . Prednisone Other (See Comments)    Mood alterations    Family History  Problem Relation Age of Onset  . Cancer Father        unsure type  . Breast cancer Sister 61  . Melanoma Paternal Uncle   . Cancer Sister        unsure type  . Cancer Cousin        unsure type     Prior to Admission medications   Medication Sig Start Date End Date Taking? Authorizing Provider  acetaminophen (TYLENOL) 500 MG tablet Take 500 mg by mouth every 8 (eight) hours as needed for moderate pain. Patient not taking: Reported on 01/24/2020    [provider]  ALPRAZolam Duanne Moron) 0.25 MG tablet Take 1 tablet (0.25 mg total) by mouth 2 (two) times daily as needed for anxiety. Patient not taking: Reported on 01/24/2020 01/16/20   Lloyd Huger, MD  aspirin EC 81 MG tablet Take 81 mg by mouth at bedtime. Swallow whole. Patient not taking: Reported on 01/24/2020    [provider]  carvedilol (COREG) 3.125 MG tablet Take 3.125 mg by mouth 2 (two) times daily.      [provider]  Cholecalciferol (VITAMIN D-3 PO) Take 2,000 mg by mouth at bedtime.  Patient not taking: Reported on 01/24/2020    [provider]  Coenzyme Q10 (CO Q 10 PO) Take 100 mg by mouth daily. Patient not taking: Reported on 01/24/2020    [provider]  EUTHYROX 25 MCG tablet Take 25 mcg by mouth daily before breakfast. 30 minutes to 1 hour prior to eating Patient not taking: Reported on 01/24/2020 11/10/19   [provider]  lidocaine-prilocaine (EMLA) cream Apply to affected area once 01/08/20   Lloyd Huger, MD  lisinopril (PRINIVIL,ZESTRIL) 10 MG tablet Take 10 mg by mouth in the morning and at bedtime.  Patient not taking: Reported on 01/24/2020    [provider]  mirtazapine (REMERON) 7.5 MG tablet Take 1 tablet (7.5 mg total) by mouth at bedtime. Patient not taking: Reported on 01/24/2020 01/16/20   Borders, Kirt Boys, NP  nitroGLYCERIN (NITROSTAT) 0.4 MG SL tablet Place 0.4 mg under the tongue every 5 (five) minutes  x 3 doses as needed for chest pain.  Patient not taking: Reported on 01/24/2020 12/17/17   [provider]  ondansetron (ZOFRAN ODT) 8 MG disintegrating tablet Take 1 tablet (8 mg total) by mouth every 8 (eight) hours as needed for nausea or vomiting. 01/24/20   Verlon Au, NP  ondansetron (ZOFRAN) 8 MG tablet Take 1 tablet (8 mg total) by mouth 2 (two) times daily as needed for refractory nausea / vomiting. 01/08/20   Lloyd Huger, MD  prochlorperazine (COMPAZINE) 10 MG tablet Take 1 tablet (10 mg total) by mouth every 6 (six) hours as needed (Nausea or vomiting). Patient not taking: Reported on 01/24/2020 01/08/20   Lloyd Huger, MD  senna (SENOKOT) 8.6 MG tablet Take 1 tablet by mouth daily as needed for constipation. Patient not taking: Reported on 01/24/2020    [provider]  simvastatin (ZOCOR) 40 MG tablet Take 40 mg by mouth at bedtime. Patient not taking: Reported on 01/24/2020     [provider]    Physical Exam: Vitals:   01/24/20 1220 01/24/20 1300 01/24/20 1345 01/24/20 1518  BP:  114/63 119/61 (!) 132/58  Pulse:  83 78 94  Resp:  19 17 (!) 23  Temp:      TempSrc:      SpO2:  94% 94% 93%  Weight: 57.2 kg     Height: 5\' 4"  (1.626 m)       Vitals:   01/24/20 1220 01/24/20 1300 01/24/20 1345 01/24/20 1518  BP:  114/63 119/61 (!) 132/58  Pulse:  83 78 94  Resp:  19 17 (!) 23  Temp:      TempSrc:      SpO2:  94% 94% 93%  Weight: 57.2 kg     Height: 5\' 4"  (1.626 m)      General: deconditioned and ill looking appearing  Neurology: Awake and alert, non focal Head and Neck. Head normocephalic. Neck supple with no adenopathy or thyromegaly.   E ENT: positive pallor, no icterus, oral mucosa dry Cardiovascular: No JVD. S1-S2 present, rhythmic, no gallops, rubs, or murmurs. Trace bilateral ankle lower extremity edema. Pulmonary: positive breath sounds bilaterally, adequate air movement, no wheezing, rhonchi or rales. Gastrointestinal. Abdomen distended and tender, no rebound, no masses, no ascites.  Skin. No rashes Musculoskeletal: no joint deformities    Labs on Admission: I have personally reviewed following labs and imaging studies  CBC: Recent Labs  Lab 01/22/20 1424 01/24/20 1050 01/24/20 1237  WBC 5.0 23.2* 24.0*  NEUTROABS 2.6 19.7* 19.9*  HGB 11.8* 10.4* 10.5*  HCT 34.2* 29.8* 32.2*  MCV 88.8 88.4 92.8  PLT 54* 72* 72*   Basic Metabolic Panel: Recent Labs  Lab 01/22/20 1424 01/24/20 1050 01/24/20 1237  NA 131* 133* 133*  K 3.8 3.4* 4.0  CL 96* 96* 97*  CO2 24 22 20*  GLUCOSE 163* 94 84  BUN 60* 83* 77*  CREATININE 1.51* 2.13* 2.07*  CALCIUM 8.4* 8.0* 8.0*  MG 1.9  --  2.2   GFR: Estimated Creatinine Clearance: 17.5 mL/min (A) (by C-G formula based on SCr of 2.07 mg/dL (H)). Liver Function Tests: Recent Labs  Lab 01/22/20 1424 01/24/20 1050 01/24/20 1237  AST 15 20 23   ALT 11 11 10   ALKPHOS 48 61 63   BILITOT 0.8 1.0 1.4*  PROT 6.8 6.3* 6.0*  ALBUMIN 3.5 3.1* 2.9*   Recent Labs  Lab 01/24/20 1237  LIPASE 39   No results for input(s):  AMMONIA in the last 168 hours. Coagulation Profile: No results for input(s): INR, PROTIME in the last 168 hours. Cardiac Enzymes: No results for input(s): CKTOTAL, CKMB, CKMBINDEX, TROPONINI in the last 168 hours. BNP (last 3 results) No results for input(s): PROBNP in the last 8760 hours. HbA1C: No results for input(s): HGBA1C in the last 72 hours. CBG: No results for input(s): GLUCAP in the last 168 hours. Lipid Profile: No results for input(s): CHOL, HDL, LDLCALC, TRIG, CHOLHDL, LDLDIRECT in the last 72 hours. Thyroid Function Tests: No results for input(s): TSH, T4TOTAL, FREET4, T3FREE, THYROIDAB in the last 72 hours. Anemia Panel: No results for input(s): VITAMINB12, FOLATE, FERRITIN, TIBC, IRON, RETICCTPCT in the last 72 hours. Urine analysis: No results found for: COLORURINE, APPEARANCEUR, [place], [place], GLUCOSEU, [place], [place], [place], PROTEINUR, UROBILINOGEN, NITRITE, LEUKOCYTESUR  Radiological Exams on Admission: CT ABDOMEN PELVIS WO CONTRAST  Result Date: 01/24/2020 CLINICAL DATA:  Abdominal pain, acute nonlocalized in the setting of metastatic ovarian cancer EXAM: CT ABDOMEN AND PELVIS WITHOUT CONTRAST TECHNIQUE: Multidetector CT imaging of the abdomen and pelvis was performed following the standard protocol without IV contrast. COMPARISON:  Chest CT of November 16, 2019 FINDINGS: Lower chest: Signs of pulmonary metastatic disease and interstitial lung disease. No effusion. Lingular nodule measuring approximately 1.4 cm previously 1.4-1.5 cm. The disease in the lung bases grossly unchanged. Upper lobes not assessed Hepatobiliary: Interval development of ascites since the prior study. No focal, suspicious hepatic lesion. Fluid is mainly about the liver. Pancreas: Pancreas without ductal dilation, inflammation or focal lesion.  Spleen: Spleen normal size with lobulated contours. Adrenals/Urinary Tract: Adrenal glands are normal. Marked RIGHT-sided hydroureteronephrosis with chronic appearing RIGHT-sided cortical thinning. RIGHT ureteral involvement from RIGHT pelvic soft tissue mass (image 66, series 2) 4.9 x 3.2 cm, contiguous with RIGHT adnexa and inseparable from with may represent the uterus but is obscured by soft tissue. Distortion also at the RIGHT bladder base on image 77 of series 2. No LEFT-sided hydronephrosis. Cyst in the LEFT kidney. Stomach/Bowel: Distal colonic obstruction with sigmoid narrowing best seen on image 67 of series 2. Irregular margins of the sigmoid colon at this level. Process results in small bowel dilation. There is a large duodenal diverticulum. Pericolonic stranding throughout. Some nodularity of lateral conal fascia and fascial planes in the abdomen. Omental nodularity seen anterior to the transverse colon on image 52 of series 2. No free air. Tethering of small bowel loops in the pelvis. Small hiatal hernia. Vascular/Lymphatic: Calcified atheromatous plaque of the abdominal aorta. Gastrohepatic adenopathy is suspected. Soft tissue may also be present about the porta hepatis. No aneurysmal dilation. Pelvic mass encases the RIGHT ureter and external iliac vasculature on the RIGHT. Reproductive: Mass in the RIGHT hemipelvis inseparable from remaining pelvic viscera. Also tethering the upper portion of the urinary bladder and RIGHT bladder wall. Other: Small volume ascites mainly about the liver. Musculoskeletal: Spinal degenerative changes. No acute or destructive bone finding. IMPRESSION: 1. Distal colonic obstruction associated with colitis presumably due to metastatic disease to the surface of the sigmoid colon. Signs of colitis with bowel distension of the colon up to 8 cm. GI and or surgical consultation may be helpful. 2. Mass in the RIGHT hemipelvis obstructs the RIGHT ureter. Chronic thinning of the  RIGHT renal cortex in the setting of severe RIGHT-sided hydro ureteral nephrosis. Mass also involves the RIGHT urinary bladder, the uterus and tethers small-bowel loops in the pelvis and adjacent sigmoid colon. 3. Extension of mass into the RIGHT pelvic sidewall as  described. 4. Findings that suggest more diffuse peritoneal involvement. A component of the ascitic fluid could also be due to colonic inflammation. 5. Evidence of pulmonary metastatic disease and interstitial lung disease. 6. Signs of pulmonary metastatic disease and interstitial lung disease. 7. Aortic atherosclerosis. Aortic Atherosclerosis (ICD10-I70.0). Electronically Signed   By: Zetta Bills M.D.   On: 01/24/2020 14:28   US Abdomen Limited  Result Date: 01/23/2020 CLINICAL DATA:  Ovarian cancer. Patient with abdominal distension. Evaluate for ascites. EXAM: LIMITED ABDOMEN ULTRASOUND FOR ASCITES TECHNIQUE: Limited ultrasound survey for ascites was performed in all four abdominal quadrants. COMPARISON:  PET-CT 12/11/2019 FINDINGS: No significant ascites. Marked dilatation of the right renal collecting system appears chronic and similar to the recent PET-CT. IMPRESSION: No evidence for ascites. Chronic right hydronephrosis. Electronically Signed   By: Markus Daft M.D.   On: 01/23/2020 15:48    EKG: Independently reviewed.  84 bpm, normal axis, normal intervals, manually corrected QTC 478, sinus rhythm, no ST segment or T wave changes, low voltage.  Assessment/Plan Principal Problem:   SBO (small bowel obstruction) (HCC) Active Problems:   Benign essential hypertension   CAD (coronary artery disease)   GERD without esophagitis   Moderate mitral insufficiency   Vitamin B12 deficiency   Ovarian cancer (HCC)   Colitis   CKD (chronic kidney disease) stage 3, GFR 30-59 ml/min   86 year old female with significant past medical history for recently diagnosed ovarian cancer currently under chemotherapy.  She also has hypertension,  coronary artery disease and chronic kidney disease stage IIIa.  Presents with 6 days of abdominal distention, abdominal pain, generalized malaise, generalized weakness, and watery stool incontinence.  On her initial physical examination blood pressure 101/52, heart rate 86, respiratory rate 18, oxygen saturation 93%.  She has dry mucous membranes, her lungs are clear to auscultation bilaterally, heart S1-S2, present rhythm, abdomen is distended, tender to deep palpation, no masses or ascites, no guarding, positive trace bilateral extremity edema. Sodium 133, potassium 4.0, chloride 97, bicarb 20, glucose 84, BUN 77, creatinine 2.0, calcium 8.0, anion gap 16, magnesium 2.2, lactic acid 1.3, white count 24.0, hemoglobin 10.5, hematocrit 32.2, platelets 72.  SARS COVID-19 negative. CT of the abdomen with distal colonic obstruction associated with colitis presumably due to metastatic disease in the surface of the sigmoid colon.  Signs of colitis with bowel distention of the colon up to 8 cm.  Mass in the right hemipelvis obstructs the right ureter.  Right-sided hydroureteronephrosis.  Mass also involving urinary bladder, the uterus and small bowel loops.  Extension of mass into the right pelvic sidewall.  Positive signs of metastatic pulmonary disease.  Patient will be admitted to the hospital with a working diagnosis colonic obstruction, colitis.  1.  Colonic obstruction and colitis, related to metastatic ovarian cancer.  Patient will be admitted to the medical ward.  Continue supportive medical therapy with isotonic saline and dextrose at 75 mL/h.  Antibiotic therapy with intravenous Zosyn, further work-up with stool C. Difficile and stool GI panel.  Surgery has been consulted.  Certainly extensive malignancy at the surface of the sigmoid colon, also affecting the right kidney.  Currently poor prognosis.  Add GI prophylaxis with pantoprazole IV.   2.  Acute kidney injury on chronic kidney disease stage III  a/anion gap metabolic acidosis.  Likely hypovolemic, continue volume resuscitation with isotonic saline and dextrose at 75 ml per hour. Close follow-up of kidney function and electrolytes.  Currently no signs of bladder outlet obstruction.  3.  Hypertension.  Patient critically ill will hold on antihypertensive medications.  4.  Coronary artery disease.  No active chest pain.  5. Hypothyroid. Change levothyroxine to IV formulation, patient will be NPO likely for more than 72 H.   Status is: Inpatient  Remains inpatient appropriate because:IV treatments appropriate due to intensity of illness or inability to take PO   Dispo: The patient is from: Home              Anticipated d/c is to: Home              Anticipated d/c date is: 3 days              Patient currently is not medically stable to d/c.   DVT prophylaxis: Heparin   Code Status:   full  Family Communication:  I spoke with patient's nephew at the bedside, we talked in detail about patient's condition, plan of care and prognosis and all questions were addressed.     Consults called:  Surgery   Admission status:  Inpatient    Kye Silverstein Gerome Apley MD [facility]   01/24/2020, 3:50 PM

## 2020-01-24 NOTE — ED Provider Notes (Signed)
[facility] Emergency Department Provider Note ____________________________________________   First MD Initiated Contact with Patient 01/24/20 1222     (approximate)  I have reviewed the triage vital signs and the nursing notes.  HISTORY  Chief Complaint Abdominal Pain   HPI Sherri [name] is a 86 y.o. femalewho presents to the ED for evaluation of abdominal pain.  Chart review indicates hx breast cancer s/p right lumpectomy, stage 4 ovarian CA that was recently diagnosed with widely metastatic disease, CAD, HTN, HLD.  Right IJ port placed 2 weeks ago.  Recently started chemotherapy just 1 week ago, receiving her first treatment.. Lives at home with husband, 2 grown children and their families live on the same street nearby and help out day-to-day.  Patient presents from home with her nephew due to complaints of increasing abdominal distention and pain over the past few days. Nephew reports poor p.o. intake over the past 1 week, and patient reporting 10/10 abdominal pain last night that resolved after a matter of hours.  Patient reports this pain was constant, diffuse/generalized and very severe at that time.  She denies pain or nausea right now.  Patient reports concurrent watery diarrhea this morning, and she cannot recall when her last solid stool was.  She reports to being pain-free at this time.  Denies nausea, fevers, syncope, chest pain, shortness of breath.   Nephew reports an outpatient ultrasound-guided paracentesis yesterday that reported no ascites, but markedly dilated loops of bowel.  Chart review of abdominal ultrasound confirms this.  Also noted right-sided hydronephrosis.  Past Medical History:  Diagnosis Date  . Anxiety   . Arthritis   . Breast cancer ([place]) 2013   RIGHT lumpectomy  . Bursitis of elbow    RIGHT  . Cancer [facility]) 2013   right breast lumpectomy with a few nodes removed  . Chronic kidney disease    stage 3  . Colitis   .  Coronary artery disease   . Diverticulitis   . Dysrhythmia    according to anxiety, tachy  . Family history of breast cancer   . Family history of melanoma   . GERD (gastroesophageal reflux disease)    okay since took her gallbladder out  . H/O lumpectomy   . Heart disease   . History of breast cancer   . History of coronary artery stent placement 2007   one stent  . HOH (hard of hearing)   . Hypercholesteremia   . Hypertension   . Hypothyroidism   . Myocardial infarction ([place])    2007  . Osteopenia   . Palpitations   . Personal history of radiation therapy   . Skin cancer     Patient Active Problem List   Diagnosis Date Noted  . SBO (small bowel obstruction) ([place]) 01/24/2020  . Family history of breast cancer   . Family history of melanoma   . History of breast cancer   . Goals of care, counseling/discussion 01/08/2020  . Ovarian cancer ([place]) 01/05/2020  . Mediastinal lymphadenopathy 11/28/2019  . Bilateral carotid artery stenosis 07/21/2017  . Bradycardia 07/21/2017  . Dizziness 01/18/2017  . Primary cancer of upper inner quadrant of right female breast ([place]) 06/08/2016  . Moderate mitral insufficiency 04/06/2016  . CAD (coronary artery disease) 03/09/2016  . Non-ST elevation myocardial infarction (NSTEMI), subendocardial infarction, subsequent episode of care ([place]) 03/09/2016  . Age related osteoporosis 01/24/2016  . Benign essential hypertension 01/24/2016  . Breast cancer, right breast ([place]) 01/24/2016  .  GERD without esophagitis 01/24/2016  . Pure hypercholesterolemia 01/24/2016  . Vitamin B12 deficiency 01/24/2016    Past Surgical History:  Procedure Laterality Date  . BREAST LUMPECTOMY Right 2013   lymph node dissection  . CATARACT EXTRACTION W/PHACO Right 07/23/2015   Procedure: CATARACT EXTRACTION PHACO AND INTRAOCULAR LENS PLACEMENT (IOC);  Surgeon: Birder Robson, MD;  Location: [facility] ORS;  Service: Ophthalmology;  Laterality: Right;  [place] 01:24 AP%  26.7 CDE 22.47 fluid pack lot # [id number]  . CATARACT EXTRACTION W/PHACO Left 08/13/2015   Procedure: CATARACT EXTRACTION PHACO AND INTRAOCULAR LENS PLACEMENT (IOC);  Surgeon: Birder Robson, MD;  Location: [facility] ORS;  Service: Ophthalmology;  Laterality: Left;  [place] 02:40 AP% 30.7 CDE 49.28 fluid pack lot # [id number]  . CHOLECYSTECTOMY    . COLONOSCOPY    . CORONARY ANGIOPLASTY  2007  . EYE SURGERY     bilateral cataract  . HARDWARE REMOVAL Right 04/29/2016   Procedure: HARDWARE REMOVAL;  Surgeon: Hessie Knows, MD;  Location: [facility] ORS;  Service: Orthopedics;  Laterality: Right;  . IR IMAGING GUIDED PORT INSERTION  01/12/2020  . ORIF ELBOW FRACTURE Right 10/01/2015   Procedure: OPEN REDUCTION INTERNAL FIXATION (ORIF) ELBOW/OLECRANON FRACTURE;  Surgeon: Hessie Knows, MD;  Location: [facility] ORS;  Service: Orthopedics;  Laterality: Right;  Marland Kitchen VIDEO BRONCHOSCOPY WITH ENDOBRONCHIAL NAVIGATION N/A 01/01/2020   Procedure: VIDEO BRONCHOSCOPY WITH ENDOBRONCHIAL NAVIGATION WITH CELLZIZIO;  Surgeon: Tyler Pita, MD;  Location: [facility] ORS;  Service: Pulmonary;  Laterality: N/A;  . VIDEO BRONCHOSCOPY WITH ENDOBRONCHIAL ULTRASOUND N/A 01/01/2020   Procedure: VIDEO BRONCHOSCOPY WITH ENDOBRONCHIAL ULTRASOUND;  Surgeon: Tyler Pita, MD;  Location: [facility] ORS;  Service: Pulmonary;  Laterality: N/A;    Prior to Admission medications   Medication Sig Start Date End Date Taking? Authorizing Provider  acetaminophen (TYLENOL) 500 MG tablet Take 500 mg by mouth every 8 (eight) hours as needed for moderate pain. Patient not taking: Reported on 01/24/2020    [provider]  ALPRAZolam Duanne Moron) 0.25 MG tablet Take 1 tablet (0.25 mg total) by mouth 2 (two) times daily as needed for anxiety. Patient not taking: Reported on 01/24/2020 01/16/20   Lloyd Huger, MD  aspirin EC 81 MG tablet Take 81 mg by mouth at bedtime. Swallow whole. Patient not taking: Reported on 01/24/2020    [provider]   carvedilol (COREG) 3.125 MG tablet Take 3.125 mg by mouth 2 (two) times daily.     [provider]  Cholecalciferol (VITAMIN D-3 PO) Take 2,000 mg by mouth at bedtime.  Patient not taking: Reported on 01/24/2020    [provider]  Coenzyme Q10 (CO Q 10 PO) Take 100 mg by mouth daily. Patient not taking: Reported on 01/24/2020    [provider]  EUTHYROX 25 MCG tablet Take 25 mcg by mouth daily before breakfast. 30 minutes to 1 hour prior to eating Patient not taking: Reported on 01/24/2020 11/10/19   [provider]  lidocaine-prilocaine (EMLA) cream Apply to affected area once 01/08/20   Lloyd Huger, MD  lisinopril (PRINIVIL,ZESTRIL) 10 MG tablet Take 10 mg by mouth in the morning and at bedtime.  Patient not taking: Reported on 01/24/2020    [provider]  mirtazapine (REMERON) 7.5 MG tablet Take 1 tablet (7.5 mg total) by mouth at bedtime. Patient not taking: Reported on 01/24/2020 01/16/20   Borders, Kirt Boys, NP  nitroGLYCERIN (NITROSTAT) 0.4 MG SL tablet Place 0.4 mg under the tongue every 5 (five) minutes x 3  doses as needed for chest pain.  Patient not taking: Reported on 01/24/2020 12/17/17   [provider]  ondansetron (ZOFRAN ODT) 8 MG disintegrating tablet Take 1 tablet (8 mg total) by mouth every 8 (eight) hours as needed for nausea or vomiting. 01/24/20   Verlon Au, NP  ondansetron (ZOFRAN) 8 MG tablet Take 1 tablet (8 mg total) by mouth 2 (two) times daily as needed for refractory nausea / vomiting. 01/08/20   Lloyd Huger, MD  prochlorperazine (COMPAZINE) 10 MG tablet Take 1 tablet (10 mg total) by mouth every 6 (six) hours as needed (Nausea or vomiting). Patient not taking: Reported on 01/24/2020 01/08/20   Lloyd Huger, MD  senna (SENOKOT) 8.6 MG tablet Take 1 tablet by mouth daily as needed for constipation. Patient not taking: Reported on 01/24/2020    [provider]  simvastatin (ZOCOR) 40 MG  tablet Take 40 mg by mouth at bedtime. Patient not taking: Reported on 01/24/2020    [provider]    Allergies Prednisone  Family History  Problem Relation Age of Onset  . Cancer Father        unsure type  . Breast cancer Sister 12  . Melanoma Paternal Uncle   . Cancer Sister        unsure type  . Cancer Cousin        unsure type    Social History Social History   Tobacco Use  . Smoking status: Former Smoker    Packs/day: 0.25    Years: 2.00    Pack years: 0.50    Types: Cigarettes    Quit date: 12/12/1955    Years since quitting: 64.1  . Smokeless tobacco: Never Used  Vaping Use  . Vaping Use: Never used  Substance Use Topics  . Alcohol use: Yes    Comment: wine occasionally  . Drug use: No    Review of Systems  Constitutional: No fever/chills Eyes: No visual changes. ENT: No sore throat. Cardiovascular: Denies chest pain. Respiratory: Denies shortness of breath. Gastrointestinal: Positive for abdominal pain, distention and diarrhea. no nausea, no vomiting. No constipation. Genitourinary: Negative for dysuria. Musculoskeletal: Negative for back pain. Skin: Negative for rash. Neurological: Negative for headaches, focal weakness or numbness.  ____________________________________________   PHYSICAL EXAM:  VITAL SIGNS: Vitals:   01/24/20 1345 01/24/20 1518  BP: 119/61 (!) 132/58  Pulse: 78 94  Resp: 17 (!) 23  Temp:    SpO2: 94% 93%      Constitutional: Alert and oriented.  No acute distress.  Chronically ill-appearing.  Conversational in full sentences. Eyes: Conjunctivae are normal. PERRL. EOMI. Head: Atraumatic. Nose: No congestion/rhinnorhea. Mouth/Throat: Mucous membranes are dry.  Oropharynx non-erythematous. Neck: No stridor. No cervical spine tenderness to palpation. Cardiovascular: Tachycardic rate, regular rhythm. Grossly normal heart sounds.  Good peripheral circulation. Respiratory: Normal respiratory effort.  No  retractions. Lungs CTAB. Gastrointestinal: Firm and obviously distended.  Mild diffuse tenderness without peritoneal features.   No abdominal bruits. No CVA tenderness. Musculoskeletal: No lower extremity tenderness.  No joint effusions. No signs of acute trauma. Minimal edema to bilateral lower extremities symmetrically without overlying skin changes or signs of trauma. Neurologic:  Normal speech and language. No gross focal neurologic deficits are appreciated. No gait instability noted. Skin:  Skin is warm, dry and intact. No rash noted. Psychiatric: Mood and affect are normal. Speech and behavior are normal.  ____________________________________________   LABS (all labs ordered are listed, but only abnormal results are  displayed)  Labs Reviewed  CBC WITH DIFFERENTIAL/PLATELET - Abnormal; Notable for the following components:      Result Value   WBC 24.0 (*)    RBC 3.47 (*)    Hemoglobin 10.5 (*)    HCT 32.2 (*)    Platelets 72 (*)    Neutro Abs 19.9 (*)    Monocytes Absolute 1.9 (*)    Abs Immature Granulocytes 1.01 (*)    All other components within normal limits  COMPREHENSIVE METABOLIC PANEL - Abnormal; Notable for the following components:   Sodium 133 (*)    Chloride 97 (*)    CO2 20 (*)    BUN 77 (*)    Creatinine, Ser 2.07 (*)    Calcium 8.0 (*)    Total Protein 6.0 (*)    Albumin 2.9 (*)    Total Bilirubin 1.4 (*)    GFR calc non Af Amer 21 (*)    GFR calc Af Amer 25 (*)    Anion gap 16 (*)    All other components within normal limits  SARS CORONAVIRUS 2 BY RT PCR (HOSPITAL ORDER, [place] [facility])  LIPASE, BLOOD  LACTIC ACID, PLASMA  MAGNESIUM  URINALYSIS, COMPLETE (UACMP) WITH MICROSCOPIC   ____________________________________________  12 Lead EKG Sinus rhythm, rate of 84 bpm, normal axis.  QTC of 660, otherwise normal intervals.  No evidence of acute ischemia.  ____________________________________________  RADIOLOGY  ED MD  interpretation: CT abdomen/pelvis reviewed with dilated loops of bowel and air/fluid levels concerning for SBO  Official radiology report(s): CT ABDOMEN PELVIS WO CONTRAST  Result Date: 01/24/2020 CLINICAL DATA:  Abdominal pain, acute nonlocalized in the setting of metastatic ovarian cancer EXAM: CT ABDOMEN AND PELVIS WITHOUT CONTRAST TECHNIQUE: Multidetector CT imaging of the abdomen and pelvis was performed following the standard protocol without IV contrast. COMPARISON:  Chest CT of November 16, 2019 FINDINGS: Lower chest: Signs of pulmonary metastatic disease and interstitial lung disease. No effusion. Lingular nodule measuring approximately 1.4 cm previously 1.4-1.5 cm. The disease in the lung bases grossly unchanged. Upper lobes not assessed Hepatobiliary: Interval development of ascites since the prior study. No focal, suspicious hepatic lesion. Fluid is mainly about the liver. Pancreas: Pancreas without ductal dilation, inflammation or focal lesion. Spleen: Spleen normal size with lobulated contours. Adrenals/Urinary Tract: Adrenal glands are normal. Marked RIGHT-sided hydroureteronephrosis with chronic appearing RIGHT-sided cortical thinning. RIGHT ureteral involvement from RIGHT pelvic soft tissue mass (image 66, series 2) 4.9 x 3.2 cm, contiguous with RIGHT adnexa and inseparable from with may represent the uterus but is obscured by soft tissue. Distortion also at the RIGHT bladder base on image 77 of series 2. No LEFT-sided hydronephrosis. Cyst in the LEFT kidney. Stomach/Bowel: Distal colonic obstruction with sigmoid narrowing best seen on image 67 of series 2. Irregular margins of the sigmoid colon at this level. Process results in small bowel dilation. There is a large duodenal diverticulum. Pericolonic stranding throughout. Some nodularity of lateral conal fascia and fascial planes in the abdomen. Omental nodularity seen anterior to the transverse colon on image 52 of series 2. No free air.  Tethering of small bowel loops in the pelvis. Small hiatal hernia. Vascular/Lymphatic: Calcified atheromatous plaque of the abdominal aorta. Gastrohepatic adenopathy is suspected. Soft tissue may also be present about the porta hepatis. No aneurysmal dilation. Pelvic mass encases the RIGHT ureter and external iliac vasculature on the RIGHT. Reproductive: Mass in the RIGHT hemipelvis inseparable from remaining pelvic viscera. Also tethering the upper  portion of the urinary bladder and RIGHT bladder wall. Other: Small volume ascites mainly about the liver. Musculoskeletal: Spinal degenerative changes. No acute or destructive bone finding. IMPRESSION: 1. Distal colonic obstruction associated with colitis presumably due to metastatic disease to the surface of the sigmoid colon. Signs of colitis with bowel distension of the colon up to 8 cm. GI and or surgical consultation may be helpful. 2. Mass in the RIGHT hemipelvis obstructs the RIGHT ureter. Chronic thinning of the RIGHT renal cortex in the setting of severe RIGHT-sided hydro ureteral nephrosis. Mass also involves the RIGHT urinary bladder, the uterus and tethers small-bowel loops in the pelvis and adjacent sigmoid colon. 3. Extension of mass into the RIGHT pelvic sidewall as described. 4. Findings that suggest more diffuse peritoneal involvement. A component of the ascitic fluid could also be due to colonic inflammation. 5. Evidence of pulmonary metastatic disease and interstitial lung disease. 6. Signs of pulmonary metastatic disease and interstitial lung disease. 7. Aortic atherosclerosis. Aortic Atherosclerosis (ICD10-I70.0). Electronically Signed   By: Zetta Bills M.D.   On: 01/24/2020 14:28    ____________________________________________   PROCEDURES and INTERVENTIONS  Procedure(s) performed (including Critical Care):  Procedures  Medications  lidocaine (XYLOCAINE) 2 % viscous mouth solution 15 mL (has no administration in time range)   lactated ringers bolus 1,000 mL (0 mLs Intravenous Stopped 01/24/20 1437)  iohexol (OMNIPAQUE) 9 MG/ML oral solution 500 mL (500 mLs Oral Contrast Given 01/24/20 1252)    ____________________________________________   MDM / ED COURSE  86 year old woman presenting from home with evidence of SBO requiring medical admission.  Normal vital signs on room air.  Examination reveals distended abdomen that is diffusely tender without peritoneal features, otherwise without evidence of acute derangements.  Blood work demonstrates leukocytosis and AKI on CKD, but no lactic acidosis.  Fluids provided for her prerenal AKI.  High suspicion for SBO, and so CT imaging obtained and demonstrates progression of metastatic disease in the her abdomen and SBO.  Spoke with surgeon on-call who recommends medical admission and attempt at conservative measures with NGT decompression, but will follow along.  NGT placed in the ED. will be the patient to hospitalist medicine for further work-up and management of her AKI and SBO.  Clinical Course as of Jan 23 1549  Wed Jan 24, 2020  1401 CT images reviewed with evidence of SBO.    [DS]  Porter with Dr. Christian Mate, surgeon on call. Requests medical admit and NGT   [DS]  49 Educated patient and nephew on diagnosis of SBO and management with NG tube initially.  We discussed admission and SBO management generally.   [DS]  1549 Discussed case with hospitalist who agrees to admit the patient.   [DS]    Clinical Course User Index [DS] Vladimir Crofts, MD     ____________________________________________   FINAL CLINICAL IMPRESSION(S) / ED DIAGNOSES  Final diagnoses:  Malignant neoplasm of both ovaries ([place])  Sinus tachycardia  AKI (acute kidney injury) ([place])  SBO (small bowel obstruction) ([place])  Generalized abdominal pain     ED Discharge Orders    None       Eloyse Causey   Note:  This document was prepared using Dragon voice recognition software and may  include unintentional dictation errors.   Vladimir Crofts, MD 01/24/20 [phone]

## 2020-01-24 NOTE — ED Notes (Signed)
Pt presents to the ED for abdominal pain, nausea, and diarrhea. Pt is a cancer pt, pt has a hx ovarian cancer and per pt it has metastasized to her lungs. Pt was seen at the cancer center this morning. Pt had a BM this AM but says it was mostly diarrhea. Pt is A&Ox4 and NAD. On assessment, abdominal was firm and distended which is not normal for the pt. Pt also c/o intermittent SOB

## 2020-01-24 NOTE — ED Notes (Signed)
Pt unable tolerate PO oral contrast. Dr. Tamala Julian, MD made aware and CT made aware.

## 2020-01-25 ENCOUNTER — Encounter: Payer: Self-pay | Admitting: Internal Medicine

## 2020-01-25 ENCOUNTER — Other Ambulatory Visit: Payer: Self-pay

## 2020-01-25 ENCOUNTER — Encounter: Payer: Self-pay | Admitting: Nurse Practitioner

## 2020-01-25 ENCOUNTER — Inpatient Hospital Stay: Payer: Medicare PPO

## 2020-01-25 DIAGNOSIS — K56609 Unspecified intestinal obstruction, unspecified as to partial versus complete obstruction: Secondary | ICD-10-CM

## 2020-01-25 DIAGNOSIS — Z515 Encounter for palliative care: Secondary | ICD-10-CM

## 2020-01-25 LAB — BASIC METABOLIC PANEL
Anion gap: 14 (ref 5–15)
BUN: 69 mg/dL — ABNORMAL HIGH (ref 8–23)
CO2: 21 mmol/L — ABNORMAL LOW (ref 22–32)
Calcium: 7.6 mg/dL — ABNORMAL LOW (ref 8.9–10.3)
Chloride: 100 mmol/L (ref 98–111)
Creatinine, Ser: 1.77 mg/dL — ABNORMAL HIGH (ref 0.44–1.00)
GFR calc Af Amer: 30 mL/min — ABNORMAL LOW (ref >60)
GFR calc non Af Amer: 26 mL/min — ABNORMAL LOW (ref >60)
Glucose, Bld: 124 mg/dL — ABNORMAL HIGH (ref 70–99)
Potassium: 3.4 mmol/L — ABNORMAL LOW (ref 3.5–5.1)
Sodium: 135 mmol/L (ref 135–145)

## 2020-01-25 LAB — CBC
HCT: 29 % — ABNORMAL LOW (ref 36.0–46.0)
Hemoglobin: 10.1 g/dL — ABNORMAL LOW (ref 12.0–15.0)
MCH: 31.3 pg (ref 26.0–34.0)
MCHC: 34.8 g/dL (ref 30.0–36.0)
MCV: 89.8 fL (ref 80.0–100.0)
Platelets: 67 10*3/uL — ABNORMAL LOW (ref 150–400)
RBC: 3.23 MIL/uL — ABNORMAL LOW (ref 3.87–5.11)
RDW: 14.7 % (ref 11.5–15.5)
WBC: 24.6 10*3/uL — ABNORMAL HIGH (ref 4.0–10.5)
nRBC: 0 % (ref 0.0–0.2)

## 2020-01-25 LAB — SURGICAL PCR SCREEN
MRSA, PCR: NEGATIVE
Staphylococcus aureus: NEGATIVE

## 2020-01-25 MED ORDER — SODIUM CHLORIDE 0.9 % IV SOLN
INTRAVENOUS | Status: DC | PRN
  Administered 2020-01-25 – 2020-01-29 (×2): 250 mL via INTRAVENOUS

## 2020-01-25 MED ORDER — CHLORHEXIDINE GLUCONATE CLOTH 2 % EX PADS
6.0000 | MEDICATED_PAD | Freq: Every day | CUTANEOUS | Status: DC
  Administered 2020-01-25 – 2020-01-27 (×3): 6 via TOPICAL

## 2020-01-25 MED ORDER — POTASSIUM CHLORIDE 10 MEQ/100ML IV SOLN
10.0000 meq | INTRAVENOUS | Status: AC
  Administered 2020-01-25 (×4): 10 meq via INTRAVENOUS
  Filled 2020-01-25 (×4): qty 100

## 2020-01-25 MED ORDER — PIPERACILLIN-TAZOBACTAM 3.375 G IVPB
3.3750 g | Freq: Three times a day (TID) | INTRAVENOUS | Status: DC
  Administered 2020-01-25 – 2020-01-30 (×14): 3.375 g via INTRAVENOUS
  Filled 2020-01-25 (×14): qty 50

## 2020-01-25 NOTE — Progress Notes (Signed)
[facility] SURGICAL PROGRESS NOTE (cpt [phone])  Hospital Day(s): 1.   Interval History: Patient seen and examined, no acute events or new complaints overnight. Patient reports she believes she feels a little better this morning but her abdomen remains markedly distended. She denies fever, chills, nausea, emesis, or abdominal pain. No BM but does report some flatus. Leukocytosis remains persistent at 24K. Additional labs and stool studies are pending this morning. KUB this morning continues to show markedly distended colon similar to presentation without free air. She has remained NPO.   Review of Systems:  Constitutional: denies fever, chills  HEENT: denies cough or congestion  Respiratory: denies any shortness of breath  Cardiovascular: denies chest pain or palpitations  Gastrointestinal: + Distension, denies abdominal pain, N/V, or diarrhea/and bowel function as per interval history Genitourinary: denies burning with urination or urinary frequency Musculoskeletal: denies pain, decreased motor or sensation  Vital signs in last 24 hours: [min-max] current  Temp:  [97 F (36.1 C)-98 F (36.7 C)] 98 F (36.7 C) (08/26 0423) Pulse Rate:  [78-105] 85 (08/26 0423) Resp:  [15-27] 20 (08/26 0423) BP: (85-132)/(47-67) 98/48 (08/26 0423) SpO2:  [90 %-95 %] 91 % (08/26 0423) Weight:  [57.2 kg] 57.2 kg (08/25 1220)     Height: 5\' 4"  (162.6 cm) Weight: 57.2 kg BMI (Calculated): 21.62   Intake/Output last 2 shifts:  08/25 0701 - 08/26 0700 In: 1627.5 [I.V.:627.5; IV Piggyback:1000] Out: -    Physical Exam:  Constitutional: alert, cooperative and no distress  HENT: normocephalic without obvious abnormality  Eyes: PERRL, EOM's grossly intact and symmetric   Respiratory: breathing non-labored at rest  Cardiovascular: regular rate and sinus rhythm  Gastrointestinal: Soft, non-tender, markedly distended and tympanic, no rebound/guarding Musculoskeletal: No edema or wounds, motor  and sensation grossly intact, NT    Labs:  CBC Latest Ref Rng & Units 01/24/2020 01/24/2020 01/24/2020  WBC 4.0 - 10.5 K/uL 24.1(H) 24.0(H) 23.2(H)  Hemoglobin 12.0 - 15.0 g/dL 9.1(L) 10.5(L) 10.4(L)  Hematocrit 36 - 46 % 27.1(L) 32.2(L) 29.8(L)  Platelets 150 - 400 K/uL 74(L) 72(L) 72(L)   CMP Latest Ref Rng & Units 01/24/2020 01/24/2020 01/22/2020  Glucose 70 - 99 mg/dL 84 94 163(H)  BUN 8 - 23 mg/dL 77(H) 83(H) 60(H)  Creatinine 0.44 - 1.00 mg/dL 2.07(H) 2.13(H) 1.51(H)  Sodium 135 - 145 mmol/L 133(L) 133(L) 131(L)  Potassium 3.5 - 5.1 mmol/L 4.0 3.4(L) 3.8  Chloride 98 - 111 mmol/L 97(L) 96(L) 96(L)  CO2 22 - 32 mmol/L 20(L) 22 24  Calcium 8.9 - 10.3 mg/dL 8.0(L) 8.0(L) 8.4(L)  Total Protein 6.5 - 8.1 g/dL 6.0(L) 6.3(L) 6.8  Total Bilirubin 0.3 - 1.2 mg/dL 1.4(H) 1.0 0.8  Alkaline Phos 38 - 126 U/L 63 61 48  AST 15 - 41 U/L 23 20 15   ALT 0 - 44 U/L 10 11 11     Imaging studies:   KUB + CXR (01/25/2020) personally reviewed showing colonic distension consistent with known obstruction without significant change and without pneumoperitoneum, and radiologist report reviewed: IMPRESSION: 1. Low colonic obstruction associated with malignancy. The degree of distension is similar to CT from yesterday. 2. Known pulmonary metastases with superimposed interstitial lung Disease.   Assessment/Plan: (ICD-10's: C56.1) 86 y.o. female with abdominal distension concerning for colonic obstruction secondary to large right pelvic malignancy (stage IV ovarian CA) which is now compressing the distal colon with likely impending complete obstruction.   - Recommend remain NPO; okay for sips with comfort  - We will  tentatively plan for diverting loop colostomy tomorrow (08/27) with Dr Christian Mate pending OR/Anesthesia availability  - Again, recommend involvement of palliative care today.   - Closely monitor abdominal examination; on-going bowel function             - I will get serial KUBs to ensure no  worsening in colonic distension             - I appreciate medicine admission; we will closely follow    All of the above findings and recommendations were discussed with the patient, and the medical team, and all of patient's questions were answered to her expressed satisfaction.  -- Edison Simon, PA-C [facility] 01/25/2020, 7:12 AM [phone] M-F: 7am - 4pm

## 2020-01-25 NOTE — ED Notes (Signed)
Pt transported to 2C-223

## 2020-01-25 NOTE — Progress Notes (Signed)
I was called back to bedside by RN as patient had questions regarding her CODE STATUS.  I met again with patient and nephew and discussed CODE STATUS at length.  Patient confirmed once again that she would not want to be resuscitated or have her life prolonged artificially on machines.  She is in agreement with DNR/DNI.  Nephew also confirmed that family agrees with this decision.  Time Total: 15 minutes  Visit consisted of counseling and education dealing with the complex and emotionally intense issues of symptom management and palliative care in the setting of serious and potentially life-threatening illness.Greater than 50%  of this time was spent counseling and coordinating care related to the above assessment and plan.  Signed by: Altha Harm, PhD, NP-C

## 2020-01-25 NOTE — Progress Notes (Signed)
PROGRESS NOTE    Sherri [name]  JJH:[id number] DOB: 06/26/1932 DOA: 01/24/2020 PCP: [name], MD    Brief Narrative:  Patient admitted to the hospital with a working diagnosis colonic obstruction, colitis in the setting of advanced ovarian cancer.   86 year old female with significant past medical history for recently diagnosed ovarian cancer currently under chemotherapy.  She also has hypertension, coronary artery disease and chronic kidney disease stage IIIa.  Presents with 6 days of abdominal distention, abdominal pain, generalized malaise, generalized weakness, and watery stool incontinence.  On her initial physical examination blood pressure 101/52, heart rate 86, respiratory rate 18, oxygen saturation 93%.  She has dry mucous membranes, her lungs are clear to auscultation bilaterally, heart S1-S2, present rhythm, abdomen is distended, tender to deep palpation, no masses or ascites, no guarding, positive trace bilateral extremity edema. Sodium 133, potassium 4.0, chloride 97, bicarb 20, glucose 84, BUN 77, creatinine 2.0, calcium 8.0, anion gap 16, magnesium 2.2, lactic acid 1.3, white count 24.0, hemoglobin 10.5, hematocrit 32.2, platelets 72.  SARS COVID-19 negative. CT of the abdomen with distal colonic obstruction associated with colitis presumably due to metastatic disease in the surface of the sigmoid colon.  Signs of colitis with bowel distention of the colon up to 8 cm.  Mass in the right hemipelvis obstructs the right ureter.  Right-sided hydroureteronephrosis.  Mass also involving urinary bladder, the uterus and small bowel loops.  Extension of mass into the right pelvic sidewall.  Positive signs of metastatic pulmonary disease.   Assessment & Plan:   Principal Problem:   Colonic obstruction (HCC) Active Problems:   Benign essential hypertension   CAD (coronary artery disease)   GERD without esophagitis   Moderate mitral insufficiency   Vitamin B12 deficiency    Ovarian cancer (HCC)   Colitis   CKD (chronic kidney disease) stage 3, GFR 30-59 ml/min   Palliative care encounter   1.  Colonic obstruction and colitis, related to metastatic ovarian cancer/.  No significant abdominal pain, but positive distention. Persistent leukocytosis at 24,6. Abdominal films with persistent colonic obstruction. Positive pulmonary metastatic lesions.  Pending stool work up with C diff and GI panel.    Continue with isotonic saline and dextrose at 75 mL/h and antibiotic therapy with Zosyn. GI prophylaxis with pantoprazole.  Patient continue to have poor prognosis due to extensive malignancy. Plan for surgical colonic intervention in am, as palliative intervention. Continue pain control with morphine.   2.  Acute kidney injury on chronic kidney disease stage III a/anion gap metabolic acidosis/ hypokalemia/ right hydronephrosis due to malignancy. Renal function with serum cr at 1,77 with K at 3,4 and serum bicarbonate at 21.   Will add Kcl 40 meq IV and will continue close monitoring of renal function and electrolytes, continue isotonic saline and dextrose infusion. Check Mg in am.  Avoid hypotension and nephrotoxic medications.   3.  Hypertension.  Blood pressure 818 mmHg systolic, will continue to hold on antihypertensive medications, continue with isotonic saline volume repletion.   4.  Coronary artery disease.  No current active chest pain.  5. Hypothyroid. On IV levothyroxine   6. Anemia related to malignancy. Hgb is stable at 10,1 and Hct at 29.0. no current indication for prbc transfusion.   Patient continue to be at high risk for worsening colonic obstruction and peritonitis.   Status is: Inpatient  Remains inpatient appropriate because:IV treatments appropriate due to intensity of illness or inability to take PO   Dispo: The patient  is from: Home              Anticipated d/c is to: Home              Anticipated d/c date is: > 3 days               Patient currently is not medically stable to d/c.   DVT prophylaxis: Enoxaparin   Code Status:   dnr   Family Communication:  I spoke with patient's nephew at the bedside, we talked in detail about patient's condition, plan of care and prognosis and all questions were addressed.     Consultants:   Surgery   Oncology   Antimicrobials:   IV zosyn     Subjective: Patient continue to be very weak and deconditioned, positive abdominal distention, no nausea or vomiting.   Objective: Vitals:   01/25/20 1000 01/25/20 1200 01/25/20 1315 01/25/20 1339  BP: 106/60 (!) 126/57  (!) 126/57  Pulse: 80 81 84 83  Resp: 18   19  Temp:      TempSrc:      SpO2: 91% 90% 91% 95%  Weight:      Height:        Intake/Output Summary (Last 24 hours) at 01/25/2020 1705 Last data filed at 01/25/2020 0501 Gross per 24 hour  Intake 627.5 ml  Output --  Net 627.5 ml   Filed Weights   01/24/20 1220  Weight: 57.2 kg    Examination:   General: Not in pain or dyspnea, deconditioned and ill looking appearing  Neurology: Awake and alert, non focal  E ENT: positive pallor, no icterus, oral mucosa dry Cardiovascular: No JVD. S1-S2 present, rhythmic, positive extra beats, with no gallops, rubs, or murmurs. Trace lower extremity edema. Pulmonary: positive breath sounds bilaterally, adequate air movement, no wheezing, rhonchi or rales. Gastrointestinal. Abdomen distended and tense, non tender to superficial palpation, no rebound or guarding Skin. No rashes Musculoskeletal: no joint deformities     Data Reviewed: I have personally reviewed following labs and imaging studies  CBC: Recent Labs  Lab 01/22/20 1424 01/24/20 1050 01/24/20 1237 01/24/20 2237 01/25/20 0711  WBC 5.0 23.2* 24.0* 24.1* 24.6*  NEUTROABS 2.6 19.7* 19.9*  --   --   HGB 11.8* 10.4* 10.5* 9.1* 10.1*  HCT 34.2* 29.8* 32.2* 27.1* 29.0*  MCV 88.8 88.4 92.8 92.5 89.8  PLT 54* 72* 72* 74* 67*   Basic Metabolic  Panel: Recent Labs  Lab 01/22/20 1424 01/24/20 1050 01/24/20 1237 01/25/20 0711  NA 131* 133* 133* 135  K 3.8 3.4* 4.0 3.4*  CL 96* 96* 97* 100  CO2 24 22 20* 21*  GLUCOSE 163* 94 84 124*  BUN 60* 83* 77* 69*  CREATININE 1.51* 2.13* 2.07* 1.77*  CALCIUM 8.4* 8.0* 8.0* 7.6*  MG 1.9  --  2.2  --    GFR: Estimated Creatinine Clearance: 20.4 mL/min (A) (by C-G formula based on SCr of 1.77 mg/dL (H)). Liver Function Tests: Recent Labs  Lab 01/22/20 1424 01/24/20 1050 01/24/20 1237  AST 15 20 23   ALT 11 11 10   ALKPHOS 48 61 63  BILITOT 0.8 1.0 1.4*  PROT 6.8 6.3* 6.0*  ALBUMIN 3.5 3.1* 2.9*   Recent Labs  Lab 01/24/20 1237  LIPASE 39   No results for input(s): AMMONIA in the last 168 hours. Coagulation Profile: No results for input(s): INR, PROTIME in the last 168 hours. Cardiac Enzymes: No results for input(s): CKTOTAL, CKMB, CKMBINDEX, TROPONINI in  the last 168 hours. BNP (last 3 results) No results for input(s): PROBNP in the last 8760 hours. HbA1C: No results for input(s): HGBA1C in the last 72 hours. CBG: No results for input(s): GLUCAP in the last 168 hours. Lipid Profile: No results for input(s): CHOL, HDL, LDLCALC, TRIG, CHOLHDL, LDLDIRECT in the last 72 hours. Thyroid Function Tests: No results for input(s): TSH, T4TOTAL, FREET4, T3FREE, THYROIDAB in the last 72 hours. Anemia Panel: No results for input(s): VITAMINB12, FOLATE, FERRITIN, TIBC, IRON, RETICCTPCT in the last 72 hours.    Radiology Studies: I have reviewed all of the imaging during this hospital visit personally     Scheduled Meds: . Chlorhexidine Gluconate Cloth  6 each Topical Daily  . heparin  5,000 Units Subcutaneous Q8H  . levothyroxine  12.5 mcg Intravenous Daily  . pantoprazole (PROTONIX) IV  40 mg Intravenous Q24H   Continuous Infusions: . sodium chloride 250 mL (01/25/20 1633)  . dextrose 5 % and 0.9% NaCl 75 mL/hr at 01/25/20 1451  . piperacillin-tazobactam 3.375 g  (01/25/20 1634)     LOS: 1 day        Kawana Hegel Gerome Apley, MD

## 2020-01-25 NOTE — Progress Notes (Signed)
Pharmacy Antibiotic Note  Sherri [name] is a 86 y.o. female admitted on 01/24/2020 with intra abdominal infection.  Pharmacy has been consulted for zosyn dosing. CrCl now 20.4 ml/min  Plan: Will adjust Zosyn from 3.375gm EI q12h to q8h for Crcl > 20 ml/min   Height: 5\' 4"  (162.6 cm) Weight: 57.2 kg (126 lb) IBW/kg (Calculated) : 54.7  Temp (24hrs), Avg:98 F (36.7 C), Min:98 F (36.7 C), Max:98 F (36.7 C)  Recent Labs  Lab 01/22/20 1424 01/24/20 1050 01/24/20 1237 01/24/20 2237 01/25/20 0711  WBC 5.0 23.2* 24.0* 24.1* 24.6*  CREATININE 1.51* 2.13* 2.07*  --  1.77*  LATICACIDVEN  --   --  1.3  --   --     Estimated Creatinine Clearance: 20.4 mL/min (A) (by C-G formula based on SCr of 1.77 mg/dL (H)).    Allergies  Allergen Reactions  . Prednisone Other (See Comments)    Mood alterations    Antimicrobials this admission:  Zosyn 8/25 (evening)>>    >>   Dose adjustments this admission:   Microbiology results:  BCx:   UCx:    Sputum:    MRSA PCR:   Thank you for allowing pharmacy to be a part of this patient's care.  Dewie Ahart A 01/25/2020 12:34 PM

## 2020-01-25 NOTE — Consult Note (Signed)
[place]  Telephone:([phone] [phone]   Name: Sherri [name] Date: 01/25/2020 MRN: [id number]  DOB: [birth year]  Patient Care Team: [name], MD as PCP - General (Family Medicine) Lloyd Huger, MD as Consulting Physician (Oncology) Clent Jacks, RN as Oncology Nurse Navigator    REASON FOR CONSULTATION: Sherri [name] is a 86 y.o. female with multiple medical problems including history of stage IIb breast cancer status post lumpectomy and XRT with completion of 5 years of Arimidex in October 2019.  She was subsequently diagnosed with stage IV ovarian cancer in June 2021.  PET scan on 12/11/2019 revealed diffuse pulmonary metastatic disease with extensive retroperitoneal lymphadenopathy and right ureter obstructed by her pelvic mass with right-sided hydronephrosis.  Currently on systemic chemotherapy.    Patient was admitted to the hospital on 01/24/2020 with progressive abdominal pain and distention.  CT of abdomen and pelvis revealed colonic obstruction secondary to her large right pelvic malignancy.  She is pending diverting loop colostomy on 8/27 for palliation.  Palliative care was consulted to help address goals and manage ongoing symptoms..   SOCIAL HISTORY:     reports that she quit smoking about 64 years ago. Her smoking use included cigarettes. She has a 0.50 pack-year smoking history. She has never used smokeless tobacco. She reports current alcohol use. She reports that she does not use drugs.  Patient is divorced.  She lives at home with her brother.  She has a son in [place] and another son and daughter who live out of state.  Patient previously worked as a Educational psychologist.  ADVANCE DIRECTIVES:  Does not have  CODE STATUS: DNR  PAST MEDICAL HISTORY: Past Medical History:  Diagnosis Date  . Anxiety   . Arthritis   . Breast cancer ([place]) 2013   RIGHT lumpectomy  . Bursitis of elbow    RIGHT   . Cancer [facility]) 2013   right breast lumpectomy with a few nodes removed  . Chronic kidney disease    stage 3  . Colitis   . Coronary artery disease   . Diverticulitis   . Dysrhythmia    according to anxiety, tachy  . Family history of breast cancer   . Family history of melanoma   . GERD (gastroesophageal reflux disease)    okay since took her gallbladder out  . H/O lumpectomy   . Heart disease   . History of breast cancer   . History of coronary artery stent placement 2007   one stent  . HOH (hard of hearing)   . Hypercholesteremia   . Hypertension   . Hypothyroidism   . Myocardial infarction ([place])    2007  . Osteopenia   . Palpitations   . Personal history of radiation therapy   . Skin cancer     PAST SURGICAL HISTORY:  Past Surgical History:  Procedure Laterality Date  . BREAST LUMPECTOMY Right 2013   lymph node dissection  . CATARACT EXTRACTION W/PHACO Right 07/23/2015   Procedure: CATARACT EXTRACTION PHACO AND INTRAOCULAR LENS PLACEMENT (IOC);  Surgeon: Birder Robson, MD;  Location: [facility] ORS;  Service: Ophthalmology;  Laterality: Right;  [place] 01:24 AP% 26.7 CDE 22.47 fluid pack lot # [id number]  . CATARACT EXTRACTION W/PHACO Left 08/13/2015   Procedure: CATARACT EXTRACTION PHACO AND INTRAOCULAR LENS PLACEMENT (IOC);  Surgeon: Birder Robson, MD;  Location: [facility] ORS;  Service: Ophthalmology;  Laterality: Left;  [place] 02:40 AP% 30.7 CDE 49.28 fluid pack lot #  [id number]  . CHOLECYSTECTOMY    . COLONOSCOPY    . CORONARY ANGIOPLASTY  2007  . EYE SURGERY     bilateral cataract  . HARDWARE REMOVAL Right 04/29/2016   Procedure: HARDWARE REMOVAL;  Surgeon: Hessie Knows, MD;  Location: [facility] ORS;  Service: Orthopedics;  Laterality: Right;  . IR IMAGING GUIDED PORT INSERTION  01/12/2020  . ORIF ELBOW FRACTURE Right 10/01/2015   Procedure: OPEN REDUCTION INTERNAL FIXATION (ORIF) ELBOW/OLECRANON FRACTURE;  Surgeon: Hessie Knows, MD;  Location: [facility] ORS;  Service: Orthopedics;   Laterality: Right;  Marland Kitchen VIDEO BRONCHOSCOPY WITH ENDOBRONCHIAL NAVIGATION N/A 01/01/2020   Procedure: VIDEO BRONCHOSCOPY WITH ENDOBRONCHIAL NAVIGATION WITH CELLZIZIO;  Surgeon: Tyler Pita, MD;  Location: [facility] ORS;  Service: Pulmonary;  Laterality: N/A;  . VIDEO BRONCHOSCOPY WITH ENDOBRONCHIAL ULTRASOUND N/A 01/01/2020   Procedure: VIDEO BRONCHOSCOPY WITH ENDOBRONCHIAL ULTRASOUND;  Surgeon: Tyler Pita, MD;  Location: [facility] ORS;  Service: Pulmonary;  Laterality: N/A;    HEMATOLOGY/ONCOLOGY HISTORY:  Oncology History  Ovarian cancer ([place])  01/05/2020 Initial Diagnosis   Ovarian cancer ([place])   01/12/2020 Cancer Staging   Staging form: Ovary, Fallopian Tube, and Primary Peritoneal Carcinoma, AJCC 8th Edition - Clinical: FIGO Stage IVB (cTX, cN1b, pM1b) - Signed by Lloyd Huger, MD on 01/12/2020   01/16/2020 -  Chemotherapy   The patient had palonosetron (ALOXI) injection 0.25 mg, 0.25 mg, Intravenous,  Once, 1 of 6 cycles Administration: 0.25 mg (01/16/2020) pegfilgrastim (NEULASTA ONPRO KIT) injection 6 mg, 6 mg, Subcutaneous, Once, 1 of 6 cycles Administration: 6 mg (01/16/2020) CARBOplatin (PARAPLATIN) 210 mg in sodium chloride 0.9 % 250 mL chemo infusion, 210 mg (103.7 % of original dose 204.4 mg), Intravenous,  Once, 1 of 6 cycles Dose modification:   (original dose 204.4 mg, Cycle 1) Administration: 210 mg (01/16/2020) fosaprepitant (EMEND) 150 mg in sodium chloride 0.9 % 145 mL IVPB, 150 mg, Intravenous,  Once, 1 of 6 cycles Administration: 150 mg (01/16/2020) PACLitaxel (TAXOL) 180 mg in sodium chloride 0.9 % 250 mL chemo infusion (> $RemoveBef'80mg'KztAaJYxDh$ /m2), 110 mg/m2 = 180 mg (100 % of original dose 110 mg/m2), Intravenous,  Once, 1 of 6 cycles Dose modification: 110 mg/m2 (original dose 110 mg/m2, Cycle 1, Reason: Patient Age) Administration: 180 mg (01/16/2020)  for chemotherapy treatment.      ALLERGIES:  is allergic to prednisone.  MEDICATIONS:  Current Facility-Administered  Medications  Medication Dose Route Frequency Provider Last Rate Last Admin  . Chlorhexidine Gluconate Cloth 2 % PADS 6 each  6 each Topical Daily Arrien, Jimmy Picket, MD      . dextrose 5 %-0.9 % sodium chloride infusion   Intravenous Continuous Arrien, Jimmy Picket, MD 75 mL/hr at 01/25/20 1451 New Bag at 01/25/20 1451  . heparin injection 5,000 Units  5,000 Units Subcutaneous Q8H Arrien, Jimmy Picket, MD   5,000 Units at 01/25/20 [phone]  . levothyroxine (SYNTHROID, LEVOTHROID) injection 12.5 mcg  12.5 mcg Intravenous Daily Oswald Hillock, RPH   12.5 mcg at 01/25/20 5638  . morphine 2 MG/ML injection 2 mg  2 mg Intravenous Q2H PRN Arrien, Jimmy Picket, MD      . ondansetron [facility]) tablet 4 mg  4 mg Oral Q6H PRN Arrien, Jimmy Picket, MD       Or  . ondansetron [facility]) injection 4 mg  4 mg Intravenous Q6H PRN Arrien, Jimmy Picket, MD      . pantoprazole (PROTONIX) injection 40 mg  40 mg Intravenous Q24H Arrien, Jimmy Picket, MD  40 mg at 01/24/20 2219  . piperacillin-tazobactam (ZOSYN) IVPB 3.375 g  3.375 g Intravenous Q8H Arrien, Jimmy Picket, MD        VITAL SIGNS: BP (!) 126/57   Pulse 83   Temp 98 F (36.7 C) (Oral)   Resp 19   Ht $R'5\' 4"'kx$  (1.626 m)   Wt 126 lb (57.2 kg)   SpO2 95%   BMI 21.63 kg/m  Filed Weights   01/24/20 1220  Weight: 126 lb (57.2 kg)    Estimated body mass index is 21.63 kg/m as calculated from the following:   Height as of this encounter: $RemoveBeforeD'5\' 4"'pTBhYXzkhlsoLD$  (1.626 m).   Weight as of this encounter: 126 lb (57.2 kg).  LABS: CBC:    Component Value Date/Time   WBC 24.6 (H) 01/25/2020 0711   HGB 10.1 (L) 01/25/2020 0711   HCT 29.0 (L) 01/25/2020 0711   PLT 67 (L) 01/25/2020 0711   MCV 89.8 01/25/2020 0711   NEUTROABS 19.9 (H) 01/24/2020 1237   LYMPHSABS 1.3 01/24/2020 1237   MONOABS 1.9 (H) 01/24/2020 1237   EOSABS 0.0 01/24/2020 1237   BASOSABS 0.0 01/24/2020 1237   Comprehensive Metabolic Panel:    Component Value Date/Time   NA  135 01/25/2020 0711   K 3.4 (L) 01/25/2020 0711   CL 100 01/25/2020 0711   CO2 21 (L) 01/25/2020 0711   BUN 69 (H) 01/25/2020 0711   CREATININE 1.77 (H) 01/25/2020 0711   GLUCOSE 124 (H) 01/25/2020 0711   CALCIUM 7.6 (L) 01/25/2020 0711   AST 23 01/24/2020 1237   ALT 10 01/24/2020 1237   ALKPHOS 63 01/24/2020 1237   BILITOT 1.4 (H) 01/24/2020 1237   PROT 6.0 (L) 01/24/2020 1237   ALBUMIN 2.9 (L) 01/24/2020 1237    RADIOGRAPHIC STUDIES: CT ABDOMEN PELVIS WO CONTRAST  Result Date: 01/24/2020 CLINICAL DATA:  Abdominal pain, acute nonlocalized in the setting of metastatic ovarian cancer EXAM: CT ABDOMEN AND PELVIS WITHOUT CONTRAST TECHNIQUE: Multidetector CT imaging of the abdomen and pelvis was performed following the standard protocol without IV contrast. COMPARISON:  Chest CT of November 16, 2019 FINDINGS: Lower chest: Signs of pulmonary metastatic disease and interstitial lung disease. No effusion. Lingular nodule measuring approximately 1.4 cm previously 1.4-1.5 cm. The disease in the lung bases grossly unchanged. Upper lobes not assessed Hepatobiliary: Interval development of ascites since the prior study. No focal, suspicious hepatic lesion. Fluid is mainly about the liver. Pancreas: Pancreas without ductal dilation, inflammation or focal lesion. Spleen: Spleen normal size with lobulated contours. Adrenals/Urinary Tract: Adrenal glands are normal. Marked RIGHT-sided hydroureteronephrosis with chronic appearing RIGHT-sided cortical thinning. RIGHT ureteral involvement from RIGHT pelvic soft tissue mass (image 66, series 2) 4.9 x 3.2 cm, contiguous with RIGHT adnexa and inseparable from with may represent the uterus but is obscured by soft tissue. Distortion also at the RIGHT bladder base on image 77 of series 2. No LEFT-sided hydronephrosis. Cyst in the LEFT kidney. Stomach/Bowel: Distal colonic obstruction with sigmoid narrowing best seen on image 67 of series 2. Irregular margins of the sigmoid  colon at this level. Process results in small bowel dilation. There is a large duodenal diverticulum. Pericolonic stranding throughout. Some nodularity of lateral conal fascia and fascial planes in the abdomen. Omental nodularity seen anterior to the transverse colon on image 52 of series 2. No free air. Tethering of small bowel loops in the pelvis. Small hiatal hernia. Vascular/Lymphatic: Calcified atheromatous plaque of the abdominal aorta. Gastrohepatic adenopathy is suspected. Soft tissue may  also be present about the porta hepatis. No aneurysmal dilation. Pelvic mass encases the RIGHT ureter and external iliac vasculature on the RIGHT. Reproductive: Mass in the RIGHT hemipelvis inseparable from remaining pelvic viscera. Also tethering the upper portion of the urinary bladder and RIGHT bladder wall. Other: Small volume ascites mainly about the liver. Musculoskeletal: Spinal degenerative changes. No acute or destructive bone finding. IMPRESSION: 1. Distal colonic obstruction associated with colitis presumably due to metastatic disease to the surface of the sigmoid colon. Signs of colitis with bowel distension of the colon up to 8 cm. GI and or surgical consultation may be helpful. 2. Mass in the RIGHT hemipelvis obstructs the RIGHT ureter. Chronic thinning of the RIGHT renal cortex in the setting of severe RIGHT-sided hydro ureteral nephrosis. Mass also involves the RIGHT urinary bladder, the uterus and tethers small-bowel loops in the pelvis and adjacent sigmoid colon. 3. Extension of mass into the RIGHT pelvic sidewall as described. 4. Findings that suggest more diffuse peritoneal involvement. A component of the ascitic fluid could also be due to colonic inflammation. 5. Evidence of pulmonary metastatic disease and interstitial lung disease. 6. Signs of pulmonary metastatic disease and interstitial lung disease. 7. Aortic atherosclerosis. Aortic Atherosclerosis (ICD10-I70.0). Electronically Signed   By:  Zetta Bills M.D.   On: 01/24/2020 14:28   US Abdomen Limited  Result Date: 01/23/2020 CLINICAL DATA:  Ovarian cancer. Patient with abdominal distension. Evaluate for ascites. EXAM: LIMITED ABDOMEN ULTRASOUND FOR ASCITES TECHNIQUE: Limited ultrasound survey for ascites was performed in all four abdominal quadrants. COMPARISON:  PET-CT 12/11/2019 FINDINGS: No significant ascites. Marked dilatation of the right renal collecting system appears chronic and similar to the recent PET-CT. IMPRESSION: No evidence for ascites. Chronic right hydronephrosis. Electronically Signed   By: Markus Daft M.D.   On: 01/23/2020 15:48   DG Chest Port 1 View  Result Date: 01/01/2020 CLINICAL DATA:  Status post biopsy. EXAM: PORTABLE CHEST 1 VIEW COMPARISON:  November 16, 2019. FINDINGS: The heart size and mediastinal contours are within normal limits. No pneumothorax or pleural effusion is noted. Multiple irregular nodular densities in reticular densities are noted throughout both lungs consistent with metastatic disease as described on prior CT scan. The visualized skeletal structures are unremarkable. IMPRESSION: Multiple irregular nodular densities and reticular densities are noted throughout both lungs consistent with metastatic disease as described on prior CT scan. Electronically Signed   By: Marijo Conception M.D.   On: 01/01/2020 15:43   DG ABD ACUTE 2+V W 1V CHEST  Result Date: 01/25/2020 CLINICAL DATA:  Colonic obstruction EXAM: DG ABDOMEN ACUTE W/ 1V CHEST COMPARISON:  Abdominal CT from yesterday FINDINGS: Low colonic obstruction. A loop in the central abdomen measures up to 7 cm in diameter, essentially stable from scanogram yesterday. No evidence of extraluminal gas collection. Reticulonodular opacities in the bilateral lungs. There is metastatic disease and pulmonary fibrosis at the lung bases by prior CT. Normal heart size. No visible effusion or pneumothorax. IMPRESSION: 1. Low colonic obstruction associated with  malignancy. The degree of distension is similar to CT from yesterday. 2. Known pulmonary metastases with superimposed interstitial lung disease. Electronically Signed   By: Monte Fantasia M.D.   On: 01/25/2020 06:36   DG C-Arm 1-60 Min-No Report  Result Date: 01/01/2020 Fluoroscopy was utilized by the requesting physician.  No radiographic interpretation.   IR IMAGING GUIDED PORT INSERTION  Result Date: 01/17/2020 CLINICAL DATA:  Ovarian neoplasm, needs durable venous access for planned treatment regimen EXAM: TUNNELED PORT  CATHETER PLACEMENT WITH ULTRASOUND AND FLUOROSCOPIC GUIDANCE FLUOROSCOPY TIME:  0.1 minute; 0.31 mGy ANESTHESIA/SEDATION: Intravenous Fentanyl 66mcg and Versed 0.$RemoveBefo'5mg'JJLYwQozxIb$  were administered as conscious sedation during continuous monitoring of the patient's level of consciousness and physiological / cardiorespiratory status by the radiology RN, with a total moderate sedation time of 32 minutes. TECHNIQUE: The procedure, risks, benefits, and alternatives were explained to the patient. Questions regarding the procedure were encouraged and answered. The patient understands and consents to the procedure. As antibiotic prophylaxis, cefazolin 2 g was ordered pre-procedure and administered intravenously within 1 hour of incision. Patency of the right IJ vein was confirmed with ultrasound with image documentation. An appropriate skin site was determined. Skin site was marked. Region was prepped using maximum barrier technique including cap and mask, sterile gown, sterile gloves, large sterile sheet, and Chlorhexidine as cutaneous antisepsis. The region was infiltrated locally with 1% lidocaine. Under real-time ultrasound guidance, the right IJ vein was accessed with a 21 gauge micropuncture needle; the needle tip within the vein was confirmed with ultrasound image documentation. Needle was exchanged over a 018 guidewire for transitional dilator, and vascular measurement was performed. A small incision  was made on the right anterior chest wall and a subcutaneous pocket fashioned. The power-injectable port was positioned and its catheter tunneled to the right IJ dermatotomy site. The transitional dilator was exchanged over an Amplatz wire for a peel-away sheath, through which the port catheter, which had been trimmed to the appropriate length, was advanced and positioned under fluoroscopy with its tip at the cavoatrial junction. Spot chest radiograph confirms good catheter position and no pneumothorax. The port was flushed per protocol. The pocket was closed with deep interrupted and subcuticular continuous 3-0 Monocryl sutures. The incisions were covered with Dermabond then covered with a sterile dressing. The patient tolerated the procedure well. COMPLICATIONS: COMPLICATIONS None immediate IMPRESSION: Technically successful right IJ power-injectable port catheter placement. Ready for routine use. Electronically Signed   By: Lucrezia Europe M.D.   On: 01/17/2020 08:23    PERFORMANCE STATUS (ECOG) : 3 - Symptomatic, >50% confined to bed  Review of Systems Unless otherwise noted, a complete review of systems is negative.  Physical Exam General: NAD Pulmonary: Unlabored Extremities: no edema, no joint deformities Skin: no rashes Neurological: Weakness but otherwise nonfocal  IMPRESSION: Patient known to me from the clinic.  I met with patient and her nephew.  Patient seems to have a reasonable understanding of her current medical problems.  She verbalized options for comfort care versus surgery.  She stated repeatedly that she is not afraid to die but "wants to fight."  She has elected to pursue diverting loop colostomy for palliation, which is scheduled for tomorrow.  Her nephew verbalized agreement with this decision.  Both recognize that cancer treatment will be entirely dependent upon how she does postoperatively.  We discussed options for rehab versus home health versus hospice.  We discussed  CODE STATUS at length.  Patient says that she wants to continue to treat the treatable but would not want resuscitation or to have her life prolonged artificially on machines.  She and nephew were in agreement with DNR/DNI.  PLAN: -Continue current scope of treatment -DNR/DNI -Will follow  Case and plan discussed with Dr. [name].   Time Total: 75 minutes  Visit consisted of counseling and education dealing with the complex and emotionally intense issues of symptom management and palliative care in the setting of serious and potentially life-threatening illness.Greater than 50%  of this time  was spent counseling and coordinating care related to the above assessment and plan.  Signed by: Altha Harm, PhD, NP-C

## 2020-01-25 NOTE — ED Notes (Signed)
Surgery at bedside.

## 2020-01-25 NOTE — Care Management Important Message (Signed)
Important Message  Patient Details  Name: Sherri [name] MRN: [id number] Date of Birth: [birth year]   Medicare Important Message Given:  Yes   Initial Medicare IM given by Patient Access Associate on 01/25/2020 at 4:07pm.  Dannette Barbara 01/25/2020, 5:50 PM

## 2020-01-26 ENCOUNTER — Inpatient Hospital Stay: Payer: Medicare PPO | Admitting: Anesthesiology

## 2020-01-26 ENCOUNTER — Encounter
Admission: EM | Disposition: A | Discharge status: Home / Self Care | Payer: Self-pay | Source: Home / Self Care | Attending: Internal Medicine

## 2020-01-26 ENCOUNTER — Inpatient Hospital Stay: Payer: Medicare PPO

## 2020-01-26 DIAGNOSIS — K56699 Other intestinal obstruction unspecified as to partial versus complete obstruction: Secondary | ICD-10-CM

## 2020-01-26 HISTORY — PX: TRANSVERSE LOOP COLOSTOMY: SHX6478

## 2020-01-26 LAB — BASIC METABOLIC PANEL
Anion gap: 14 (ref 5–15)
BUN: 50 mg/dL — ABNORMAL HIGH (ref 8–23)
CO2: 29 mmol/L (ref 22–32)
Calcium: 7.9 mg/dL — ABNORMAL LOW (ref 8.9–10.3)
Chloride: 110 mmol/L (ref 98–111)
Creatinine, Ser: 1.63 mg/dL — ABNORMAL HIGH (ref 0.44–1.00)
GFR calc Af Amer: 33 mL/min — ABNORMAL LOW (ref >60)
GFR calc non Af Amer: 29 mL/min — ABNORMAL LOW (ref >60)
Glucose, Bld: 154 mg/dL — ABNORMAL HIGH (ref 70–99)
Potassium: 4.2 mmol/L (ref 3.5–5.1)
Sodium: 140 mmol/L (ref 135–145)

## 2020-01-26 LAB — CBC WITH DIFFERENTIAL/PLATELET
Abs Immature Granulocytes: 1.47 10*3/uL — ABNORMAL HIGH (ref 0.00–0.07)
Basophils Absolute: 0 10*3/uL (ref 0.0–0.1)
Basophils Relative: 0 %
Eosinophils Absolute: 0 10*3/uL (ref 0.0–0.5)
Eosinophils Relative: 0 %
HCT: 30.3 % — ABNORMAL LOW (ref 36.0–46.0)
Hemoglobin: 10.2 g/dL — ABNORMAL LOW (ref 12.0–15.0)
Immature Granulocytes: 6 %
Lymphocytes Relative: 6 %
Lymphs Abs: 1.5 10*3/uL (ref 0.7–4.0)
MCH: 30.4 pg (ref 26.0–34.0)
MCHC: 33.7 g/dL (ref 30.0–36.0)
MCV: 90.4 fL (ref 80.0–100.0)
Monocytes Absolute: 1.4 10*3/uL — ABNORMAL HIGH (ref 0.1–1.0)
Monocytes Relative: 6 %
Neutro Abs: 20.5 10*3/uL — ABNORMAL HIGH (ref 1.7–7.7)
Neutrophils Relative %: 82 %
Platelets: 96 10*3/uL — ABNORMAL LOW (ref 150–400)
RBC: 3.35 MIL/uL — ABNORMAL LOW (ref 3.87–5.11)
RDW: 15 % (ref 11.5–15.5)
WBC: 24.9 10*3/uL — ABNORMAL HIGH (ref 4.0–10.5)
nRBC: 0 % (ref 0.0–0.2)

## 2020-01-26 LAB — MAGNESIUM: Magnesium: 2.1 mg/dL (ref 1.7–2.4)

## 2020-01-26 SURGERY — CREATION, COLOSTOMY, LOOP, TRANSVERSE COLON
Anesthesia: General

## 2020-01-26 MED ORDER — PROPOFOL 10 MG/ML IV BOLUS
INTRAVENOUS | Status: DC | PRN
  Administered 2020-01-26: 120 mg via INTRAVENOUS

## 2020-01-26 MED ORDER — LACTATED RINGERS IV SOLN: INTRAVENOUS | Status: DC | PRN

## 2020-01-26 MED ORDER — PIPERACILLIN-TAZOBACTAM 3.375 G IVPB
INTRAVENOUS | Status: AC
  Filled 2020-01-26: qty 50

## 2020-01-26 MED ORDER — LIDOCAINE HCL (CARDIAC) PF 100 MG/5ML IV SOSY
PREFILLED_SYRINGE | INTRAVENOUS | Status: DC | PRN
  Administered 2020-01-26: 50 mg via INTRAVENOUS

## 2020-01-26 MED ORDER — FENTANYL CITRATE (PF) 100 MCG/2ML IJ SOLN
INTRAMUSCULAR | Status: DC | PRN
Start: 2020-01-26 — End: 2020-01-26
  Administered 2020-01-26 (×2): 50 ug via INTRAVENOUS

## 2020-01-26 MED ORDER — PROPOFOL 10 MG/ML IV BOLUS
INTRAVENOUS | Status: AC
  Filled 2020-01-26: qty 20

## 2020-01-26 MED ORDER — BUPIVACAINE LIPOSOME 1.3 % IJ SUSP
INTRAMUSCULAR | Status: AC
  Filled 2020-01-26: qty 20

## 2020-01-26 MED ORDER — SUGAMMADEX SODIUM 200 MG/2ML IV SOLN
INTRAVENOUS | Status: DC | PRN
  Administered 2020-01-26: 30 mg via INTRAVENOUS
  Administered 2020-01-26: 120 mg via INTRAVENOUS

## 2020-01-26 MED ORDER — EPHEDRINE SULFATE 50 MG/ML IJ SOLN
INTRAMUSCULAR | Status: DC | PRN
  Administered 2020-01-26 (×2): 5 mg via INTRAVENOUS

## 2020-01-26 MED ORDER — ALBUMIN HUMAN 5 % IV SOLN: INTRAVENOUS | Status: DC | PRN

## 2020-01-26 MED ORDER — ONDANSETRON HCL 4 MG/2ML IJ SOLN
INTRAMUSCULAR | Status: AC
  Filled 2020-01-26: qty 2

## 2020-01-26 MED ORDER — PHENYLEPHRINE HCL-NACL 20-0.9 MG/250ML-% IV SOLN
INTRAVENOUS | Status: DC | PRN
  Administered 2020-01-26: 50 ug/min via INTRAVENOUS

## 2020-01-26 MED ORDER — BUPIVACAINE LIPOSOME 1.3 % IJ SUSP
INTRAMUSCULAR | Status: DC | PRN
  Administered 2020-01-26: 7 mL

## 2020-01-26 MED ORDER — VASOPRESSIN 20 UNIT/ML IV SOLN
INTRAVENOUS | Status: DC | PRN
  Administered 2020-01-26 (×4): 1 [IU] via INTRAVENOUS

## 2020-01-26 MED ORDER — ROCURONIUM BROMIDE 100 MG/10ML IV SOLN
INTRAVENOUS | Status: DC | PRN
  Administered 2020-01-26: 50 mg via INTRAVENOUS

## 2020-01-26 MED ORDER — FENTANYL CITRATE (PF) 100 MCG/2ML IJ SOLN: 25.0000 ug | INTRAMUSCULAR | Status: DC | PRN

## 2020-01-26 MED ORDER — ALBUMIN HUMAN 5 % IV SOLN
INTRAVENOUS | Status: AC
  Filled 2020-01-26: qty 500

## 2020-01-26 MED ORDER — BUPIVACAINE-EPINEPHRINE 0.25% -1:200000 IJ SOLN
INTRAMUSCULAR | Status: DC | PRN
  Administered 2020-01-26: 7 mL

## 2020-01-26 MED ORDER — FENTANYL CITRATE (PF) 100 MCG/2ML IJ SOLN
INTRAMUSCULAR | Status: AC
  Filled 2020-01-26: qty 2

## 2020-01-26 MED ORDER — PHENYLEPHRINE HCL (PRESSORS) 10 MG/ML IV SOLN
INTRAVENOUS | Status: DC | PRN
  Administered 2020-01-26 (×3): 200 ug via INTRAVENOUS

## 2020-01-26 MED ORDER — ONDANSETRON HCL 4 MG/2ML IJ SOLN
4.0000 mg | Freq: Once | INTRAMUSCULAR | Status: AC | PRN
  Administered 2020-01-26: 4 mg via INTRAVENOUS

## 2020-01-26 MED ORDER — BUPIVACAINE-EPINEPHRINE (PF) 0.25% -1:200000 IJ SOLN
INTRAMUSCULAR | Status: AC
  Filled 2020-01-26: qty 20

## 2020-01-26 SURGICAL SUPPLY — 41 items
APL PRP STRL LF DISP 70% ISPRP (MISCELLANEOUS) ×1
BLADE SURG 15 STRL LF DISP TIS (BLADE) ×1 IMPLANT
BLADE SURG 15 STRL SS (BLADE) ×2
CANISTER SUCT 1200ML W/VALVE (MISCELLANEOUS) ×2 IMPLANT
CATH ROBINSON RED A/P 20FR (CATHETERS) ×2 IMPLANT
CHLORAPREP W/TINT 26 (MISCELLANEOUS) ×2 IMPLANT
COVER WAND RF STERILE (DRAPES) ×2 IMPLANT
DRAIN PENROSE 1/4X12 LTX STRL (WOUND CARE) IMPLANT
DRAPE INCISE IOBAN 66X45 STRL (DRAPES) IMPLANT
DRAPE LAPAROTOMY 100X77 ABD (DRAPES) ×2 IMPLANT
ELECT BLADE 6.5 EXT (BLADE) ×2 IMPLANT
ELECT CAUTERY BLADE 6.4 (BLADE) ×2 IMPLANT
ELECT REM PT RETURN 9FT ADLT (ELECTROSURGICAL) ×2
ELECTRODE REM PT RTRN 9FT ADLT (ELECTROSURGICAL) ×1 IMPLANT
GLOVE ORTHO TXT STRL SZ7.5 (GLOVE) ×4 IMPLANT
GOWN STRL REUS W/ TWL LRG LVL3 (GOWN DISPOSABLE) ×2 IMPLANT
GOWN STRL REUS W/TWL LRG LVL3 (GOWN DISPOSABLE) ×4
HANDLE YANKAUER SUCT BULB TIP (MISCELLANEOUS) IMPLANT
KIT OSTOMY 2 PC DRNBL 2.25 STR (WOUND CARE) ×1 IMPLANT
KIT OSTOMY DRAINABLE 2.25 STR (WOUND CARE) ×2
KIT OSTOMY DRAINABLE 2.75 STR (WOUND CARE) IMPLANT
KIT TURNOVER KIT A (KITS) ×2 IMPLANT
LABEL OR SOLS (LABEL) ×2 IMPLANT
LIGASURE IMPACT 36 18CM CVD LR (INSTRUMENTS) IMPLANT
LOOP OSTOMY BRIDGE (OSTOMY) IMPLANT
NEEDLE HYPO 22GX1.5 SAFETY (NEEDLE) ×2 IMPLANT
NS IRRIG 1000ML POUR BTL (IV SOLUTION) ×2 IMPLANT
PACK BASIN MAJOR ARMC (MISCELLANEOUS) ×2 IMPLANT
SHEARS HARMONIC STRL 23CM (MISCELLANEOUS) IMPLANT
SPONGE LAP 18X18 RF (DISPOSABLE) ×2 IMPLANT
STAPLER SKIN PROX 35W (STAPLE) IMPLANT
SUT PDS AB 0 CT1 27 (SUTURE) IMPLANT
SUT SILK 2 0 (SUTURE) ×2
SUT SILK 2-0 18XBRD TIE 12 (SUTURE) ×1 IMPLANT
SUT SILK 3-0 (SUTURE) ×2 IMPLANT
SUT VIC AB 3-0 SH 27 (SUTURE)
SUT VIC AB 3-0 SH 27X BRD (SUTURE) IMPLANT
SUT VICRYL 0 AB UR-6 (SUTURE) ×2 IMPLANT
SUT VICRYL 3-0 SH-1 18IN (SUTURE) ×4 IMPLANT
SYR 10ML LL (SYRINGE) ×2 IMPLANT
TRAY FOLEY MTR SLVR 16FR STAT (SET/KITS/TRAYS/PACK) IMPLANT

## 2020-01-26 NOTE — Progress Notes (Signed)
Initial Nutrition Assessment  DOCUMENTATION CODES:   Not applicable  INTERVENTION:   RD will add supplements once diet advanced  Pt at high refeed risk; recommend monitor K, Mg and P labs daily as oral intake improves.   NUTRITION DIAGNOSIS:   Increased nutrient needs related to cancer and cancer related treatments as evidenced by increased estimated needs.  GOAL:   Patient will meet greater than or equal to 90% of their needs  MONITOR:   PO intake, Supplement acceptance, Labs, Weight trends, Skin, I & O's  REASON FOR ASSESSMENT:   Malnutrition Screening Tool    ASSESSMENT:   86 year old female with past medical history significant for hypertension, CAD, mitral insufficiency, GERD, osteoporosis, history of breast cancer status post lumpectomy- XRT and AI x6 months and high cholesterol who was recently diagnosed with stage IV ovarian cancer. colonic obstruction secondary to large right pelvic malignancy (stage IV ovarian CA) which is now compressing the distal colon.   Unable to see pt today as pt in procedure at time of RD visit. Per chart review, pt with poor appetite and oral intake for ~1 week pta r/t nausea, vomiting and abdominal distension. Pt NPO today for scheduled  transverse loop colostomy. RD will add supplements once diet advanced. Pt is at high refeed risk. Per chart, pt down 10lbs(7%) over the past 8 months; this is not significant. Pt at high risk for malnutrition. RD will obtain nutrition related history and exam at follow-up.    Medications reviewed and include: heparin, synthroid, protonix, NaCl w/ 5% dextrose @75ml /hr, zosyn  Labs reviewed: K 3.4(L), BUN 69(H), creat 1.77(H), Mg 2.2 wnl Wbc- 24.9(H), Hgb 10.2(L), Hct 30.3(L)  NUTRITION - FOCUSED PHYSICAL EXAM: Unable to perform at this time   Diet Order:   Diet Order            Diet NPO time specified  Diet effective midnight                EDUCATION NEEDS:   Not appropriate for education at  this time  Skin:  Skin Assessment: Reviewed RN Assessment  Last BM:  8/26- TYPE 7  Height:   Ht Readings from Last 1 Encounters:  01/24/20 5\' 4"  (1.626 m)    Weight:   Wt Readings from Last 1 Encounters:  01/26/20 57.2 kg    Ideal Body Weight:  54.5 kg  BMI:  Body mass index is 21.63 kg/m.  Estimated Nutritional Needs:   Kcal:  1500-1700kcal/day  Protein:  75-85g/day  Fluid:  >1.4L/day  Koleen Distance MS, RD, LDN Please refer to [facility] for RD and/or RD on-call/weekend/after hours pager

## 2020-01-26 NOTE — Progress Notes (Signed)
Phone consent received by this nurse and Ernestine Mcmurray, RN. From Ray patients nephew along with [place], [place].

## 2020-01-26 NOTE — Progress Notes (Signed)
PROGRESS NOTE    Sherri [name]  WJX:[id number] DOB: 18-Feb-1933 DOA: 01/24/2020 PCP: [name], MD    Brief Narrative:  Patient admitted to the hospital with a working diagnosis acute colonic obstruction, colitis in the setting of advanced ovarian cancer.   86 year old female with significant past medical history for recently diagnosed ovarian cancer currently under chemotherapy. She also has hypertension, coronary artery disease and chronic kidney disease stage IIIa. Presents with 6 days of abdominal distention, abdominal pain, generalized malaise, generalized weakness, and watery stool incontinence. On her initial physical examination blood pressure 101/52, heart rate 86, respiratory rate 18, oxygen saturation 93%. She had dry mucous membranes, her lungs were clear to auscultation bilaterally, heart S1-S2, present rhythm, abdomen was distended, tender to deep palpation, no masses or ascites, no guarding, positive trace bilateral extremity edema. Sodium 133, potassium 4.0, chloride 97, bicarb 20, glucose 84, BUN 77, creatinine 2.0, calcium 8.0, anion gap 16, magnesium 2.2, lactic acid 1.3, white count 24.0, hemoglobin 10.5, hematocrit 32.2, platelets 72.SARS COVID-19 negative.  CT of the abdomen with distal colonic obstruction associated with colitis presumably due to metastatic disease in the surface of the sigmoid colon. Signs of colitis with bowel distention of the colon up to 8 cm. Mass in the right hemipelvis obstructs the right ureter. Right-sided hydroureteronephrosis. Mass also involving urinary bladder, the uterus and small bowel loops. Extension of mass into the right pelvic sidewall. Positive signs of metastatic pulmonary disease.  Patient with persistent abdominal distention, she will undergo palliative loop colostomy. Palliative care was consulted and code status changed to DNR, patient with poor prognosis.    Assessment & Plan:   Principal Problem:   Colonic  obstruction (HCC) Active Problems:   Benign essential hypertension   CAD (coronary artery disease)   GERD without esophagitis   Moderate mitral insufficiency   Vitamin B12 deficiency   Ovarian cancer (HCC)   Colitis   CKD (chronic kidney disease) stage 3, GFR 30-59 ml/min   Palliative care encounter   1.Colonic obstruction and colitis, related to metastatic ovarian cancer. Patient with persistent abdominal distention, positive watery diarrhea, no vomiting. Pending this am wbc. Systolic blood pressure 213 to 129 mmHg, HR 80. Stool studies pending. Will continue contact precautions for now.   Hydration with dextrose/ NS at 75 mL/h, continue antibiotic therapy with Zosyn and GI prophylaxis with IV pantoprazole. Supportive medical therapy with as needed antiemetics and analgesics.   Patient continue to have poor prognosis due to extensive malignancy.  Plan for palliative loop colostomy today.   2.Acute kidney injury on chronic kidney disease stage III a/anion gap metabolic acidosis/ hypokalemia/ right hydronephrosis due to malignancy. Renal function pending this am.  Patient has remained NPO, continue hydration with isotonic saline at 75 ml per H, no clinical signs of volume overload.   Continue to follow renal function, avoid hypotension and nephrotoxic medications.   3.Hypertension.Holding antihypertensive medications.   4.Coronary artery disease.No chest pain,   5. Hypothyroid. Continue with hormonal replacement with IV levothyroxine   6. Anemia related to malignancy. Pending Hgb and Hct, threshold for PRBC transfusion Hgb less than 7.0.   Patient continue to be at high risk for worsening colitis.   Status is: Inpatient  Remains inpatient appropriate because:IV treatments appropriate due to intensity of illness or inability to take PO   Dispo: The patient is from: Home              Anticipated d/c is to: SNF  Anticipated d/c date is: 3 days               Patient currently is not medically stable to d/c.   DVT prophylaxis: heparin  Code Status:   dnr   Family Communication:  No family at the bedside      Consultants:   Surgery  Oncology   Palliative care  Antimicrobials:   Zosyn     Subjective: Patient very weak and deconditioned, continue to have significant abdominal distention, with nausea but not vomiting, no dyspnea and no chest pain.   Objective: Vitals:   01/25/20 1339 01/25/20 2107 01/25/20 2200 01/26/20 0542  BP: (!) 126/57 (!) 100/48  129/63  Pulse: 83 83  80  Resp: 19 18  18   Temp:  98.5 F (36.9 C)  97.6 F (36.4 C)  TempSrc:  Oral  Oral  SpO2: 95% 90% 95% 91%  Weight:      Height:        Intake/Output Summary (Last 24 hours) at 01/26/2020 0830 Last data filed at 01/26/2020 0308 Gross per 24 hour  Intake 1168.54 ml  Output 200 ml  Net 968.54 ml   Filed Weights   01/24/20 1220  Weight: 57.2 kg    Examination:   General: deconditioned and ill looking appearing  Neurology: Awake and alert, non focal  E ENT: positive pallor, no icterus, oral mucosa dry Cardiovascular: No JVD. S1-S2 present, rhythmic, no gallops, rubs, or murmurs. Trace ankle  edema. Pulmonary: positive breath sounds bilaterally, decreased air movement at bases, no wheezing, rhonchi or rales. Gastrointestinal. Abdomen very distended and tense, tender to deep and superfical palpation more at the right side, decreased bowel sounds. No ascites.  Skin. No rashes Musculoskeletal: no joint deformities     Data Reviewed: I have personally reviewed following labs and imaging studies  CBC: Recent Labs  Lab 01/22/20 1424 01/24/20 1050 01/24/20 1237 01/24/20 2237 01/25/20 0711  WBC 5.0 23.2* 24.0* 24.1* 24.6*  NEUTROABS 2.6 19.7* 19.9*  --   --   HGB 11.8* 10.4* 10.5* 9.1* 10.1*  HCT 34.2* 29.8* 32.2* 27.1* 29.0*  MCV 88.8 88.4 92.8 92.5 89.8  PLT 54* 72* 72* 74* 67*   Basic Metabolic Panel: Recent Labs  Lab  01/22/20 1424 01/24/20 1050 01/24/20 1237 01/25/20 0711  NA 131* 133* 133* 135  K 3.8 3.4* 4.0 3.4*  CL 96* 96* 97* 100  CO2 24 22 20* 21*  GLUCOSE 163* 94 84 124*  BUN 60* 83* 77* 69*  CREATININE 1.51* 2.13* 2.07* 1.77*  CALCIUM 8.4* 8.0* 8.0* 7.6*  MG 1.9  --  2.2  --    GFR: Estimated Creatinine Clearance: 20.4 mL/min (A) (by C-G formula based on SCr of 1.77 mg/dL (H)). Liver Function Tests: Recent Labs  Lab 01/22/20 1424 01/24/20 1050 01/24/20 1237  AST 15 20 23   ALT 11 11 10   ALKPHOS 48 61 63  BILITOT 0.8 1.0 1.4*  PROT 6.8 6.3* 6.0*  ALBUMIN 3.5 3.1* 2.9*   Recent Labs  Lab 01/24/20 1237  LIPASE 39   No results for input(s): AMMONIA in the last 168 hours. Coagulation Profile: No results for input(s): INR, PROTIME in the last 168 hours. Cardiac Enzymes: No results for input(s): CKTOTAL, CKMB, CKMBINDEX, TROPONINI in the last 168 hours. BNP (last 3 results) No results for input(s): PROBNP in the last 8760 hours. HbA1C: No results for input(s): HGBA1C in the last 72 hours. CBG: No results for input(s): GLUCAP in the last  168 hours. Lipid Profile: No results for input(s): CHOL, HDL, LDLCALC, TRIG, CHOLHDL, LDLDIRECT in the last 72 hours. Thyroid Function Tests: No results for input(s): TSH, T4TOTAL, FREET4, T3FREE, THYROIDAB in the last 72 hours. Anemia Panel: No results for input(s): VITAMINB12, FOLATE, FERRITIN, TIBC, IRON, RETICCTPCT in the last 72 hours.    Radiology Studies: I have reviewed all of the imaging during this hospital visit personally     Scheduled Meds: . Chlorhexidine Gluconate Cloth  6 each Topical Daily  . heparin  5,000 Units Subcutaneous Q8H  . levothyroxine  12.5 mcg Intravenous Daily  . pantoprazole (PROTONIX) IV  40 mg Intravenous Q24H   Continuous Infusions: . sodium chloride Stopped (01/25/20 1634)  . dextrose 5 % and 0.9% NaCl 75 mL/hr at 01/26/20 0810  . piperacillin-tazobactam 3.375 g (01/26/20 0542)     LOS: 2  days        Nahima Ales Gerome Apley, MD

## 2020-01-26 NOTE — Transfer of Care (Signed)
Immediate Anesthesia Transfer of Care Note  Patient: Sherri [name]  Procedure(s) Performed: TRANSVERSE LOOP COLOSTOMY (N/A )  Patient Location: PACU  Anesthesia Type:General  Level of Consciousness: awake, alert  and oriented  Airway & Oxygen Therapy: Patient Spontanous Breathing and Patient connected to face mask oxygen  Post-op Assessment: Report given to RN and Post -op Vital signs reviewed and stable  Post vital signs: stable  Last Vitals:  Vitals Value Taken Time  BP 113/63 01/26/20 1157  Temp    Pulse 65 01/26/20 1203  Resp 20 01/26/20 1203  SpO2 99 % 01/26/20 1203  Vitals shown include unvalidated device data.  Last Pain:  Vitals:   01/26/20 1201  TempSrc:   PainSc: (P) Asleep         Complications: No complications documented.

## 2020-01-26 NOTE — Progress Notes (Signed)
Pharmacy Antibiotic Note  Sherri [name] is a 86 y.o. female admitted on 01/24/2020 with colitis with loop colostomy today performed today.  Pharmacy was consulted for zosyn dosing. This is day # 2 of Zosyn; since admission her renal function has improved but not yet back to her baseline SCr of approximately 1.3 mg/dL, WBC remains elevated but with no recent febrile episodes.  Plan:   Continue Zosyn 3.375gm EI  q8h   Height: 5\' 4"  (162.6 cm) Weight: 57.2 kg (126 lb) IBW/kg (Calculated) : 54.7  Temp (24hrs), Avg:98.1 F (36.7 C), Min:97.6 F (36.4 C), Max:98.5 F (36.9 C)  Recent Labs  Lab 01/22/20 1424 01/24/20 1050 01/24/20 1237 01/24/20 2237 01/25/20 0711  WBC 5.0 23.2* 24.0* 24.1* 24.6*  CREATININE 1.51* 2.13* 2.07*  --  1.77*  LATICACIDVEN  --   --  1.3  --   --     Estimated Creatinine Clearance: 20.4 mL/min (A) (by C-G formula based on SCr of 1.77 mg/dL (H)).    Allergies  Allergen Reactions  . Prednisone Other (See Comments)    Mood alterations    Antimicrobials this admission:  Zosyn 8/25 (evening)>>   Microbiology results: 8/25 SARS CoV-2: negative:   8/26 MRSA PCR: negative  Thank you for allowing pharmacy to be a part of this patient's care.  Dallie Piles 01/26/2020 8:38 AM

## 2020-01-26 NOTE — Anesthesia Preprocedure Evaluation (Signed)
Anesthesia Evaluation  Patient identified by MRN, date of birth, ID band Patient awake    Reviewed: Allergy & Precautions, H&P , NPO status , Patient's Chart, lab work & pertinent test results, reviewed documented beta blocker date and time   History of Anesthesia Complications Negative for: history of anesthetic complications  Airway Mallampati: III       Dental   Pulmonary neg pulmonary ROS, neg sleep apnea, neg COPD, Not current smoker, former smoker,           Cardiovascular Exercise Tolerance: Poor hypertension, On Medications + CAD, + Past MI and + Cardiac Stents  + dysrhythmias   July eval and clearance per Dr. Nehemiah Massed.  JA   Neuro/Psych neg Seizures Anxiety negative neurological ROS  negative psych ROS   GI/Hepatic Neg liver ROS, GERD  Medicated,  Endo/Other  neg diabetesHypothyroidism   Renal/GU Renal InsufficiencyRenal disease  negative genitourinary   Musculoskeletal   Abdominal   Peds  Hematology negative hematology ROS (+)   Anesthesia Other Findings   Reproductive/Obstetrics negative OB ROS                             Anesthesia Physical  Anesthesia Plan  ASA: IV  Anesthesia Plan: General   Post-op Pain Management:    Induction: Intravenous  PONV Risk Score and Plan:   Airway Management Planned: Oral ETT  Additional Equipment:   Intra-op Plan:   Post-operative Plan:   Informed Consent: I have reviewed the patients History and Physical, chart, labs and discussed the procedure including the risks, benefits and alternatives for the proposed anesthesia with the patient or authorized representative who has indicated his/her understanding and acceptance.       Plan Discussed with:   Anesthesia Plan Comments:         Anesthesia Quick Evaluation

## 2020-01-26 NOTE — Anesthesia Procedure Notes (Signed)
Procedure Name: Intubation Date/Time: 01/26/2020 10:33 AM Performed by: Natasha Mead, CRNA Pre-anesthesia Checklist: Patient identified, Emergency Drugs available, Suction available and Patient being monitored Patient Re-evaluated:Patient Re-evaluated prior to induction Oxygen Delivery Method: Circle system utilized Preoxygenation: Pre-oxygenation with 100% oxygen Induction Type: IV induction Ventilation: Mask ventilation without difficulty Laryngoscope Size: Miller and 2 Tube type: Oral Number of attempts: 1 Airway Equipment and Method: Stylet and Oral airway Placement Confirmation: ETT inserted through vocal cords under direct vision,  positive ETCO2 and breath sounds checked- equal and bilateral Tube secured with: Tape Dental Injury: Teeth and Oropharynx as per pre-operative assessment

## 2020-01-26 NOTE — Op Note (Signed)
Loop colostomy creation, transverse colon.  Pre-operative Diagnosis: Colonic obstruction secondary to metastatic ovarian carcinoma.  Post-operative Diagnosis: same.    Surgeon: Ronny Bacon, M.D., FACS  Assistant: Shon Millet, PA-C.  Anesthesia: General endotracheal.  Findings: Clear peritoneal fluid.  Estimated Blood Loss: 5 mL         Specimens: None          Complications: none              Procedure Details  The patient was seen again in the Holding Room. The benefits, complications, treatment options, and expected outcomes were discussed with the patient. The risks of bleeding, infection, recurrence of symptoms, failure to resolve symptoms, unanticipated injury, prosthetic placement, prosthetic infection, any of which could require further surgery were reviewed with the patient. The likelihood of improving the patient's symptoms with return to their baseline status is good.  The patient and/or family concurred with the proposed plan, giving informed consent.  The patient was taken to Operating Room, identified and the procedure verified.    Prior to the induction of general anesthesia, antibiotic prophylaxis was administered. VTE prophylaxis was in place.  General anesthesia was then administered and tolerated well. After the induction, the patient was positioned in the supine position and the abdomen was prepped with Chloraprep and draped in the sterile fashion.  A Time Out was held and the above information confirmed. With local anesthesia of a one-to-one ratio mixture of Exparel to quarter percent Marcaine.  A transverse incision was made in the right upper quadrant.  It was extended down through the subcutaneous tissues with electrosurgery, maintaining excellent hemostasis.  The anterior rectus sheath was opened transversely, the rectus muscle was then split vertically without cutting muscle.  Posterior sheath was then opened and the peritoneum entered.  Egress of clear  peritoneal fluid was noted.  I was able to reach up and palpate the omentum, and gradually work my way to the loop of transverse colon.  With [employer] I was able to work it with traction into the operative field.  Some lysis of omental tethering was necessary to free it for adequate mobility. I then created a blunt window through the transverse mesocolon, and passed a red Robinson catheter through this loop.  This helped in retracting the colon.  I then utilized a single horizontal mattress suture of 0 Vicryl to approximate the fascia within the same omental window.  We then opened the colon transversely, and matured the stoma with 3-0 Vicryl interrupted sutures.  We then secured the red Quentin Cornwall to itself and applied a stoma appliance over a dermal coating of benzoin.  This completed the procedure, which she tolerated well.      Ronny Bacon M.D., [employer] [facility] 01/26/2020 12:07 PM

## 2020-01-26 NOTE — Anesthesia Postprocedure Evaluation (Signed)
Anesthesia Post Note  Patient: Sherri [name]  Procedure(s) Performed: TRANSVERSE LOOP COLOSTOMY (N/A )  Patient location during evaluation: PACU Anesthesia Type: General Level of consciousness: awake and alert Pain management: pain level controlled Vital Signs Assessment: post-procedure vital signs reviewed and stable Respiratory status: spontaneous breathing and respiratory function stable Cardiovascular status: stable Anesthetic complications: no   No complications documented.   Last Vitals:  Vitals:   01/26/20 1307 01/26/20 1340  BP: (!) 105/53 (!) 112/56  Pulse: 79 77  Resp: 16 16  Temp: 36.8 C 36.6 C  SpO2: 96% 97%    Last Pain:  Vitals:   01/26/20 1340  TempSrc: Oral  PainSc:                  Yazleemar Strassner K

## 2020-01-26 NOTE — Consult Note (Addendum)
[place] Nurse ostomy consult note Stoma type/location: Pt had colostomy surgery performed today; she just arrived to the floor and is groggy and nauseated.    Family member at the bedside states she is cared for at home and will need someone to learn to apply the pouch and empty; they do not feel that the patient will be able to learn to perform these activities without assistance.  Discussed that our team is not here on Sat/Sun and we will perform the first pouch change and teaching session on Mon. [place] team will need to attempt a family member to be present at the time which is arranged for a teaching session on that day.   Stomal assessment/size: Stoma is red and viable when visualized through the pouch, which is intact with a good seal, small amt brown liquid stool.  Red rubber rod in place.  Educational materials left at the bedside with extra pouching supplies for staff nurse use.  Enrolled patient in [place] program: Not yet Julien Girt MSN, [place], [place], [place], [place]

## 2020-01-26 NOTE — Progress Notes (Signed)
[facility] Hospital Day(s): 2.   Interval History: Patient seen and examined, no acute events or new complaints overnight. Patient reports she is doing well, still with abdominal distension. No abdominal pain, nausea, emesis, fever, chills. Labs are still pending this morning. KUB still with marked colonic distension. Plan for transverse loop colostomy this afternoon.   Vital signs in last 24 hours: [min-max] current  Temp:  [97.6 F (36.4 C)-98.5 F (36.9 C)] 97.6 F (36.4 C) (08/27 0542) Pulse Rate:  [80-84] 80 (08/27 0542) Resp:  [18-19] 18 (08/27 0542) BP: (100-129)/(48-63) 129/63 (08/27 0542) SpO2:  [90 %-95 %] 91 % (08/27 0542)     Height: 5\' 4"  (162.6 cm) Weight: 57.2 kg BMI (Calculated): 21.62   Intake/Output last 2 shifts:  08/26 0701 - 08/27 0700 In: 1168.5 [I.V.:1039.4; IV Piggyback:129.1] Out: 200 [Urine:200]   Physical Exam:  Constitutional: alert, cooperative and no distress  HENT: normocephalic without obvious abnormality  Eyes: PERRL, EOM's grossly intact and symmetric   Respiratory: breathing non-labored at rest  Cardiovascular: regular rate and sinus rhythm  Gastrointestinal: Soft, non-tender, markedly distended and tympanic, no rebound/guarding Musculoskeletal: No edema or wounds, motor and sensation grossly intact, NT   Labs:  CBC Latest Ref Rng & Units 01/25/2020 01/24/2020 01/24/2020  WBC 4.0 - 10.5 K/uL 24.6(H) 24.1(H) 24.0(H)  Hemoglobin 12.0 - 15.0 g/dL 10.1(L) 9.1(L) 10.5(L)  Hematocrit 36 - 46 % 29.0(L) 27.1(L) 32.2(L)  Platelets 150 - 400 K/uL 67(L) 74(L) 72(L)   CMP Latest Ref Rng & Units 01/25/2020 01/24/2020 01/24/2020  Glucose 70 - 99 mg/dL 124(H) 84 94  BUN 8 - 23 mg/dL 69(H) 77(H) 83(H)  Creatinine 0.44 - 1.00 mg/dL 1.77(H) 2.07(H) 2.13(H)  Sodium 135 - 145 mmol/L 135 133(L) 133(L)  Potassium 3.5 - 5.1 mmol/L 3.4(L) 4.0 3.4(L)  Chloride 98 - 111 mmol/L 100 97(L) 96(L)  CO2 22 - 32 mmol/L 21(L) 20(L) 22   Calcium 8.9 - 10.3 mg/dL 7.6(L) 8.0(L) 8.0(L)  Total Protein 6.5 - 8.1 g/dL - 6.0(L) 6.3(L)  Total Bilirubin 0.3 - 1.2 mg/dL - 1.4(H) 1.0  Alkaline Phos 38 - 126 U/L - 63 61  AST 15 - 41 U/L - 23 20  ALT 0 - 44 U/L - 10 11     Imaging studies:   KUB (01/26/2020) personally reviewed showing colonic distension consistent with known obstruction without significant change and without pneumoperitoneum, and radiologist report reviewed: IMPRESSION: Persistent diffuse colonic and small bowel distention without interim change from prior exam. Findings consistent with distal colonic obstruction as noted on prior CT of 01/24/2020.   Assessment/Plan: (ICD-10's: C56.1) 86 y.o. female with abdominal distension concerning for colonic obstruction secondary to large right pelvic malignancy (stage IV ovarian CA) which is now compressing the distal colon with likely impending complete obstruction.   - Plan for transverse loop colostomy this afternoon with Dr Christian Mate pending OR/Anesthesia availability  - All risks, benefits, and alternatives to above procedure(s) were discussed with the patient, all of her questions were answered to her expressed satisfaction, patient expresses she wishes to proceed, and informed consent was obtained.  - NPO for now + IVF Resuscitation   - Monitor abdominal examination; on-going bowel function   - Pain control prn; antiemetics prn  - Appreciate palliative care consult  - Further management per primary service; we will of course follow   All of the above findings and recommendations were discussed with the patient, and the medical team, and all of patient's  questions were answered to her expressed satisfaction.  -- Edison Simon, PA-C [facility] 01/26/2020, 8:01 AM [phone] M-F: 7am - 4pm

## 2020-01-27 ENCOUNTER — Encounter: Payer: Self-pay | Admitting: Surgery

## 2020-01-27 LAB — CBC WITH DIFFERENTIAL/PLATELET
Abs Immature Granulocytes: 1.38 10*3/uL — ABNORMAL HIGH (ref 0.00–0.07)
Basophils Absolute: 0 10*3/uL (ref 0.0–0.1)
Basophils Relative: 0 %
Eosinophils Absolute: 0 10*3/uL (ref 0.0–0.5)
Eosinophils Relative: 0 %
HCT: 28.9 % — ABNORMAL LOW (ref 36.0–46.0)
Hemoglobin: 9.4 g/dL — ABNORMAL LOW (ref 12.0–15.0)
Immature Granulocytes: 6 %
Lymphocytes Relative: 7 %
Lymphs Abs: 1.5 10*3/uL (ref 0.7–4.0)
MCH: 30.4 pg (ref 26.0–34.0)
MCHC: 32.5 g/dL (ref 30.0–36.0)
MCV: 93.5 fL (ref 80.0–100.0)
Monocytes Absolute: 0.9 10*3/uL (ref 0.1–1.0)
Monocytes Relative: 4 %
Neutro Abs: 19.1 10*3/uL — ABNORMAL HIGH (ref 1.7–7.7)
Neutrophils Relative %: 83 %
Platelets: 92 10*3/uL — ABNORMAL LOW (ref 150–400)
RBC: 3.09 MIL/uL — ABNORMAL LOW (ref 3.87–5.11)
RDW: 15.2 % (ref 11.5–15.5)
WBC Morphology: INCREASED
WBC: 22.9 10*3/uL — ABNORMAL HIGH (ref 4.0–10.5)
nRBC: 0 % (ref 0.0–0.2)

## 2020-01-27 LAB — BASIC METABOLIC PANEL
Anion gap: 5 (ref 5–15)
BUN: 39 mg/dL — ABNORMAL HIGH (ref 8–23)
CO2: 27 mmol/L (ref 22–32)
Calcium: 7.1 mg/dL — ABNORMAL LOW (ref 8.9–10.3)
Chloride: 110 mmol/L (ref 98–111)
Creatinine, Ser: 1.15 mg/dL — ABNORMAL HIGH (ref 0.44–1.00)
GFR calc Af Amer: 51 mL/min — ABNORMAL LOW (ref >60)
GFR calc non Af Amer: 44 mL/min — ABNORMAL LOW (ref >60)
Glucose, Bld: 143 mg/dL — ABNORMAL HIGH (ref 70–99)
Potassium: 3 mmol/L — ABNORMAL LOW (ref 3.5–5.1)
Sodium: 142 mmol/L (ref 135–145)

## 2020-01-27 MED ORDER — LEVOTHYROXINE SODIUM 50 MCG PO TABS
25.0000 ug | ORAL_TABLET | Freq: Every day | ORAL | Status: DC
  Administered 2020-01-28 – 2020-01-30 (×3): 25 ug via ORAL
  Filled 2020-01-27 (×3): qty 1

## 2020-01-27 MED ORDER — DEXTROSE IN LACTATED RINGERS 5 % IV SOLN: INTRAVENOUS | Status: DC

## 2020-01-27 MED ORDER — PANTOPRAZOLE SODIUM 40 MG PO TBEC
40.0000 mg | DELAYED_RELEASE_TABLET | Freq: Every day | ORAL | Status: DC
  Administered 2020-01-27 – 2020-01-30 (×4): 40 mg via ORAL
  Filled 2020-01-27 (×4): qty 1

## 2020-01-27 MED ORDER — ENOXAPARIN SODIUM 40 MG/0.4ML ~~LOC~~ SOLN
40.0000 mg | SUBCUTANEOUS | Status: DC
  Administered 2020-01-27: 40 mg via SUBCUTANEOUS
  Filled 2020-01-27: qty 0.4

## 2020-01-27 MED ORDER — POTASSIUM CHLORIDE CRYS ER 20 MEQ PO TBCR
40.0000 meq | EXTENDED_RELEASE_TABLET | ORAL | Status: AC
  Administered 2020-01-27 (×2): 40 meq via ORAL
  Filled 2020-01-27 (×2): qty 2

## 2020-01-27 NOTE — Progress Notes (Signed)
Subjective:  CC: Sherri [name] is a 86 y.o. female  Hospital stay day 3, 1 Day Post-Op transverse loop colostomy for obstructing metastatic ovarian CA  HPI: No acute issues overnight.  ROS:  General: Denies weight loss, weight gain, fatigue, fevers, chills, and night sweats. Heart: Denies chest pain, palpitations, racing heart, irregular heartbeat, leg pain or swelling, and decreased activity tolerance. Respiratory: Denies breathing difficulty, shortness of breath, wheezing, cough, and sputum. GI: Denies change in appetite, heartburn, nausea, vomiting, constipation, diarrhea, and blood in stool. GU: Denies difficulty urinating, pain with urinating, urgency, frequency, blood in urine.   Objective:   Temp:  [97.3 F (36.3 C)-98.7 F (37.1 C)] 98.5 F (36.9 C) (08/28 0007) Pulse Rate:  [65-93] 91 (08/28 0007) Resp:  [12-26] 17 (08/28 0007) BP: (105-129)/(53-71) 109/67 (08/28 0007) SpO2:  [91 %-99 %] 93 % (08/28 0007) Weight:  [57.2 kg] 57.2 kg (08/27 0910)     Height: 5\' 4"  (162.6 cm) Weight: 57.2 kg BMI (Calculated): 21.62   Intake/Output this shift:   Intake/Output Summary (Last 24 hours) at 01/27/2020 0340 Last data filed at 01/26/2020 1210 Gross per 24 hour  Intake 1000 ml  Output 215 ml  Net 785 ml    Constitutional :  alert, cooperative, appears stated age and no distress  Respiratory:  clear to auscultation bilaterally  Cardiovascular:  regular rate and rhythm  Gastrointestinal: soft, no guarding, but with some distention noted?baseline?.  ostomy is patent with liquid stool and gas.   Skin: Cool and moist.   Psychiatric: Normal affect, non-agitated, not confused       LABS:  CMP Latest Ref Rng & Units 01/26/2020 01/25/2020 01/24/2020  Glucose 70 - 99 mg/dL 154(H) 124(H) 84  BUN 8 - 23 mg/dL 50(H) 69(H) 77(H)  Creatinine 0.44 - 1.00 mg/dL 1.63(H) 1.77(H) 2.07(H)  Sodium 135 - 145 mmol/L 140 135 133(L)  Potassium 3.5 - 5.1 mmol/L 4.2 3.4(L) 4.0  Chloride 98 - 111  mmol/L 110 100 97(L)  CO2 22 - 32 mmol/L 29 21(L) 20(L)  Calcium 8.9 - 10.3 mg/dL 7.9(L) 7.6(L) 8.0(L)  Total Protein 6.5 - 8.1 g/dL - - 6.0(L)  Total Bilirubin 0.3 - 1.2 mg/dL - - 1.4(H)  Alkaline Phos 38 - 126 U/L - - 63  AST 15 - 41 U/L - - 23  ALT 0 - 44 U/L - - 10   CBC Latest Ref Rng & Units 01/26/2020 01/25/2020 01/24/2020  WBC 4.0 - 10.5 K/uL 24.9(H) 24.6(H) 24.1(H)  Hemoglobin 12.0 - 15.0 g/dL 10.2(L) 10.1(L) 9.1(L)  Hematocrit 36 - 46 % 30.3(L) 29.0(L) 27.1(L)  Platelets 150 - 400 K/uL 96(L) 67(L) 74(L)    RADS: n/a Assessment:   S/p  transverse loop colostomy for obstructing metastatic ovarian CA.  Some baseline? Distention noted but ostomy seems functional.  Will keep on clears for today just in case.  Continue to monitor.

## 2020-01-27 NOTE — Progress Notes (Signed)
PROGRESS NOTE    Sherri [name]  BBC:[id number] DOB: [birth year] DOA: 01/24/2020 PCP: [name], MD    Brief Narrative:  Patient admitted to the hospital with a working diagnosis acute colonic obstruction, colitisin the setting of advanced ovarian cancer.  86 year old female with significant past medical history for recently diagnosed ovarian cancer currently under chemotherapy. She also has hypertension, coronary artery disease and chronic kidney disease stage IIIa. Presents with 6 days of abdominal distention, abdominal pain, generalized malaise, generalized weakness, and watery stool incontinence. On her initial physical examination blood pressure 101/52, heart rate 86, respiratory rate 18, oxygen saturation 93%. She had dry mucous membranes, her lungs were clear to auscultation bilaterally, heart S1-S2, present rhythm, abdomen was distended, tender to deep palpation, no masses or ascites, no guarding, positive trace bilateral extremity edema. Sodium 133, potassium 4.0, chloride 97, bicarb 20, glucose 84, BUN 77, creatinine 2.0, calcium 8.0, anion gap 16, magnesium 2.2, lactic acid 1.3, white count 24.0, hemoglobin 10.5, hematocrit 32.2, platelets 72.SARS COVID-19 negative.  CT of the abdomen with distal colonic obstruction associated with colitis presumably due to metastatic disease in the surface of the sigmoid colon. Signs of colitis with bowel distention of the colon up to 8 cm. Mass in the right hemipelvis obstructs the right ureter. Right-sided hydroureteronephrosis. Mass also involving urinary bladder, the uterus and small bowel loops. Extension of mass into the right pelvic sidewall. Positive signs of metastatic pulmonary disease.  Patient with persistent abdominal distention, she unerwent palliative transverse loop colostomy creation.   Palliative care was consulted and code status changed to DNR, patient with poor prognosis.    Assessment & Plan:    Principal Problem:   Colonic obstruction (HCC) Active Problems:   Benign essential hypertension   CAD (coronary artery disease)   GERD without esophagitis   Moderate mitral insufficiency   Vitamin B12 deficiency   Ovarian cancer (HCC)   Colitis   CKD (chronic kidney disease) stage 3, GFR 30-59 ml/min   Palliative care encounter   1.Colonic obstruction and colitis, related to metastatic ovarian cancer. Sp palliative transverse loop colostomy creation 08/27. Wbc is down to 22.9. No stool studies. Will continue with IV zosyn for now, OK to discontinue contact precautions.   Her abdominal pain has improved, continue to have distention but is able to tolerate clears with no nausea or vomiting.  Continue with supportive IV fluids, will decrease rate to 50 ml per H, continue electrolyte correction. Pain control with morphine and GI prophylaxis with pantoprazole. As needed ondansetron for nausea.   Follow up with surgical recommendations.  Out of bed to chair tid with meals, physical and occupational therapy.   2.Acute kidney injury on chronic kidney disease stage III a/anion gap metabolic acidosis/ hypokalemia/ right hydronephrosis due to malignancy. Renal function this am with serum cr at 1,15 with K at 3,0 and serum bicarbonate at 27. Na 142 and Cl 110.  Patient now tolerating clear liquids. Change IV fluids to balanced electrolyte solutions with dextrose at 50 ml per H.  Add 80 meq kcl po in 2 divided doses and follow up on renal function and electrolytes in am, including Mg.    3.Hypertension.blood pressure this am 100 to 503 mmHg systolic. Continue close monitoring, hold on antihypertensive medications.   4.Coronary artery disease.No active chest pain,   5. Hypothyroid.Will change back to oral levothyroxine   6. Anemia related to malignancy. Hgb stable at 9,4 with Hct at 28,9. Continue close monitoring of cell count. No  current indication for PRBC transfusion.     Patient continue to be at high risk for worsening colitis.   Status is: Inpatient  Remains inpatient appropriate because:IV treatments appropriate due to intensity of illness or inability to take PO   Dispo: The patient is from: Home              Anticipated d/c is to: Home              Anticipated d/c date is: 3 days              Patient currently is not medically stable to d/c.  DVT prophylaxis: Enoxaparin   Code Status:    dnr   Family Communication:  I spoke with patient's nice at the bedside, we talked in detail about patient's condition, plan of care and prognosis and all questions were addressed.      Nutrition Status: Nutrition Problem: Increased nutrient needs Etiology: cancer and cancer related treatments Signs/Symptoms: estimated needs Interventions: Ensure Enlive (each supplement provides 350kcal and 20 grams of protein), MVI, Magic cup      Consultants:   Surgery   Procedures:   palliative transverse loop colostomy creation.     Antimicrobials:   Zosyn    Subjective: Patient is feeling better and tolerating clears, continue to have abdominal distention   Objective: Vitals:   01/26/20 1953 01/27/20 0007 01/27/20 0453 01/27/20 1212  BP: 125/71 109/67 117/64 (!) 100/58  Pulse: 93 91 92 81  Resp: 17 17  20   Temp: 98.3 F (36.8 C) 98.5 F (36.9 C) 98.3 F (36.8 C) 98.3 F (36.8 C)  TempSrc:  Oral Oral Oral  SpO2: 95% 93% 94% 97%  Weight:      Height:        Intake/Output Summary (Last 24 hours) at 01/27/2020 1219 Last data filed at 01/27/2020 0600 Gross per 24 hour  Intake --  Output 420 ml  Net -420 ml   Filed Weights   01/24/20 1220 01/26/20 0910  Weight: 57.2 kg 57.2 kg    Examination:   General: Not in pain or dyspnea, deconditioned  Neurology: Awake and alert, non focal  E ENT:  Positive pallor, no icterus, oral mucosa moist Cardiovascular: No JVD. S1-S2 present, rhythmic, no gallops, rubs, or murmurs. No lower extremity  edema. Pulmonary: vesicular breath sounds bilaterally, adequate air movement, no wheezing, rhonchi or rales. Gastrointestinal. Abdomen colostomy bag in place with liquid stool, distended abdomen non tender to superficial palpation.  Skin. No rashes Musculoskeletal: no joint deformities     Data Reviewed: I have personally reviewed following labs and imaging studies  CBC: Recent Labs  Lab 01/22/20 1424 01/22/20 1424 01/24/20 1050 01/24/20 1050 01/24/20 1237 01/24/20 2237 01/25/20 0711 01/26/20 0729 01/27/20 0520  WBC 5.0   < > 23.2*   < > 24.0* 24.1* 24.6* 24.9* 22.9*  NEUTROABS 2.6  --  19.7*  --  19.9*  --   --  20.5* 19.1*  HGB 11.8*   < > 10.4*   < > 10.5* 9.1* 10.1* 10.2* 9.4*  HCT 34.2*   < > 29.8*   < > 32.2* 27.1* 29.0* 30.3* 28.9*  MCV 88.8   < > 88.4   < > 92.8 92.5 89.8 90.4 93.5  PLT 54*   < > 72*   < > 72* 74* 67* 96* 92*   < > = values in this interval not displayed.   Basic Metabolic Panel: Recent Labs  Lab 01/22/20 1424  01/22/20 1424 01/24/20 1050 01/24/20 1237 01/25/20 0711 01/26/20 0729 01/27/20 0520  NA 131*   < > 133* 133* 135 140 142  K 3.8   < > 3.4* 4.0 3.4* 4.2 3.0*  CL 96*   < > 96* 97* 100 110 110  CO2 24   < > 22 20* 21* 29 27  GLUCOSE 163*   < > 94 84 124* 154* 143*  BUN 60*   < > 83* 77* 69* 50* 39*  CREATININE 1.51*   < > 2.13* 2.07* 1.77* 1.63* 1.15*  CALCIUM 8.4*   < > 8.0* 8.0* 7.6* 7.9* 7.1*  MG 1.9  --   --  2.2  --  2.1  --    < > = values in this interval not displayed.   GFR: Estimated Creatinine Clearance: 31.4 mL/min (A) (by C-G formula based on SCr of 1.15 mg/dL (H)). Liver Function Tests: Recent Labs  Lab 01/22/20 1424 01/24/20 1050 01/24/20 1237  AST 15 20 23   ALT 11 11 10   ALKPHOS 48 61 63  BILITOT 0.8 1.0 1.4*  PROT 6.8 6.3* 6.0*  ALBUMIN 3.5 3.1* 2.9*   Recent Labs  Lab 01/24/20 1237  LIPASE 39   No results for input(s): AMMONIA in the last 168 hours. Coagulation Profile: No results for input(s):  INR, PROTIME in the last 168 hours. Cardiac Enzymes: No results for input(s): CKTOTAL, CKMB, CKMBINDEX, TROPONINI in the last 168 hours. BNP (last 3 results) No results for input(s): PROBNP in the last 8760 hours. HbA1C: No results for input(s): HGBA1C in the last 72 hours. CBG: No results for input(s): GLUCAP in the last 168 hours. Lipid Profile: No results for input(s): CHOL, HDL, LDLCALC, TRIG, CHOLHDL, LDLDIRECT in the last 72 hours. Thyroid Function Tests: No results for input(s): TSH, T4TOTAL, FREET4, T3FREE, THYROIDAB in the last 72 hours. Anemia Panel: No results for input(s): VITAMINB12, FOLATE, FERRITIN, TIBC, IRON, RETICCTPCT in the last 72 hours.    Radiology Studies: I have reviewed all of the imaging during this hospital visit personally     Scheduled Meds: . Chlorhexidine Gluconate Cloth  6 each Topical Daily  . heparin  5,000 Units Subcutaneous Q8H  . levothyroxine  12.5 mcg Intravenous Daily  . pantoprazole (PROTONIX) IV  40 mg Intravenous Q24H  . potassium chloride  40 mEq Oral Q4H   Continuous Infusions: . sodium chloride Stopped (01/25/20 1634)  . dextrose 5 % and 0.9% NaCl 75 mL/hr at 01/27/20 0111  . piperacillin-tazobactam 3.375 g (01/27/20 0457)     LOS: 3 days        Cereniti Curb Gerome Apley, MD

## 2020-01-28 DIAGNOSIS — I2510 Atherosclerotic heart disease of native coronary artery without angina pectoris: Secondary | ICD-10-CM

## 2020-01-28 LAB — BASIC METABOLIC PANEL
Anion gap: 9 (ref 5–15)
BUN: 37 mg/dL — ABNORMAL HIGH (ref 8–23)
CO2: 24 mmol/L (ref 22–32)
Calcium: 7.9 mg/dL — ABNORMAL LOW (ref 8.9–10.3)
Chloride: 108 mmol/L (ref 98–111)
Creatinine, Ser: 1.25 mg/dL — ABNORMAL HIGH (ref 0.44–1.00)
GFR calc Af Amer: 46 mL/min — ABNORMAL LOW (ref >60)
GFR calc non Af Amer: 39 mL/min — ABNORMAL LOW (ref >60)
Glucose, Bld: 116 mg/dL — ABNORMAL HIGH (ref 70–99)
Potassium: 3.4 mmol/L — ABNORMAL LOW (ref 3.5–5.1)
Sodium: 141 mmol/L (ref 135–145)

## 2020-01-28 LAB — CBC WITH DIFFERENTIAL/PLATELET
Abs Immature Granulocytes: 1.34 10*3/uL — ABNORMAL HIGH (ref 0.00–0.07)
Basophils Absolute: 0 10*3/uL (ref 0.0–0.1)
Basophils Relative: 0 %
Eosinophils Absolute: 0.1 10*3/uL (ref 0.0–0.5)
Eosinophils Relative: 0 %
HCT: 30.2 % — ABNORMAL LOW (ref 36.0–46.0)
Hemoglobin: 9.7 g/dL — ABNORMAL LOW (ref 12.0–15.0)
Immature Granulocytes: 5 %
Lymphocytes Relative: 9 %
Lymphs Abs: 2.3 10*3/uL (ref 0.7–4.0)
MCH: 30.6 pg (ref 26.0–34.0)
MCHC: 32.1 g/dL (ref 30.0–36.0)
MCV: 95.3 fL (ref 80.0–100.0)
Monocytes Absolute: 1.1 10*3/uL — ABNORMAL HIGH (ref 0.1–1.0)
Monocytes Relative: 4 %
Neutro Abs: 22 10*3/uL — ABNORMAL HIGH (ref 1.7–7.7)
Neutrophils Relative %: 82 %
Platelets: 103 10*3/uL — ABNORMAL LOW (ref 150–400)
RBC: 3.17 MIL/uL — ABNORMAL LOW (ref 3.87–5.11)
RDW: 15.4 % (ref 11.5–15.5)
Smear Review: DECREASED
WBC: 26.9 10*3/uL — ABNORMAL HIGH (ref 4.0–10.5)
nRBC: 0 % (ref 0.0–0.2)

## 2020-01-28 LAB — MAGNESIUM: Magnesium: 1.8 mg/dL (ref 1.7–2.4)

## 2020-01-28 MED ORDER — ENOXAPARIN SODIUM 30 MG/0.3ML ~~LOC~~ SOLN
30.0000 mg | SUBCUTANEOUS | Status: DC
  Administered 2020-01-28: 30 mg via SUBCUTANEOUS
  Filled 2020-01-28: qty 0.3

## 2020-01-28 MED ORDER — MAGNESIUM SULFATE 2 GM/50ML IV SOLN
2.0000 g | Freq: Once | INTRAVENOUS | Status: AC
  Administered 2020-01-28: 2 g via INTRAVENOUS
  Filled 2020-01-28: qty 50

## 2020-01-28 MED ORDER — POTASSIUM CHLORIDE 10 MEQ/100ML IV SOLN
10.0000 meq | INTRAVENOUS | Status: AC
  Administered 2020-01-28 (×4): 10 meq via INTRAVENOUS
  Filled 2020-01-28 (×2): qty 100

## 2020-01-28 NOTE — Progress Notes (Signed)
PROGRESS NOTE    NOVALYN LAJARA  SHF:[id number] DOB: 03-22-1933 DOA: 01/24/2020 PCP: [name], MD    Brief Narrative:  Patient admitted to the hospital with a working diagnosisacutecolonic obstruction, colitisin the setting of advanced ovarian cancer.  86 year old female with significant past medical history for recently diagnosed ovarian cancer currently under chemotherapy. She also has hypertension, coronary artery disease and chronic kidney disease stage IIIa. Presents with 6 days of abdominal distention, abdominal pain, generalized malaise, generalized weakness, and watery stool incontinence. On her initial physical examination blood pressure 101/52, heart rate 86, respiratory rate 18, oxygen saturation 93%. She haddry mucous membranes, her lungs were clear to auscultation bilaterally, heart S1-S2, present rhythm, abdomenwasdistended, tender to deep palpation, no masses or ascites, no guarding, positive trace bilateral extremity edema. Sodium 133, potassium 4.0, chloride 97, bicarb 20, glucose 84, BUN 77, creatinine 2.0, calcium 8.0, anion gap 16, magnesium 2.2, lactic acid 1.3, white count 24.0, hemoglobin 10.5, hematocrit 32.2, platelets 72.SARS COVID-19 negative.  CT of the abdomen with distal colonic obstruction associated with colitis presumably due to metastatic disease in the surface of the sigmoid colon. Signs of colitis with bowel distention of the colon up to 8 cm. Mass in the right hemipelvis obstructs the right ureter. Right-sided hydroureteronephrosis. Mass also involving urinary bladder, the uterus and small bowel loops. Extension of mass into the right pelvic sidewall. Positive signs of metastatic pulmonary disease.  Patient with persistent abdominal distention, she unerwent palliative transverse loop colostomy creation.   Palliative care was consulted and code status changed to DNR, patient with poor prognosis.    Assessment & Plan:    Principal Problem:   Colonic obstruction (HCC) Active Problems:   Benign essential hypertension   CAD (coronary artery disease)   GERD without esophagitis   Moderate mitral insufficiency   Vitamin B12 deficiency   Ovarian cancer (HCC)   Colitis   CKD (chronic kidney disease) stage 3, GFR 30-59 ml/min   Palliative care encounter   1.Colonic obstruction and colitis, related to metastatic ovarian cancer. Sp palliative transverse loop colostomy creation 08/27. Wbc continue to be elevated up to 26.9, patient has been afebrile, no significant abdominal pain and tolerating clears.   Continue with IV fluids and will continue supportive medical care. Differential white cell count with 82% PMN and 1,34 absolute immature granulocytes. For now will continue with Zosyn.   Continue to follow with surgery recommendations to advance diet.   2.Acute kidney injury on chronic kidney disease stage III a/anion gap metabolic acidosis/ hypokalemia/ right hydronephrosis due to malignancy. Stable renal function with serum cr at 1.25 with K down to 3,4 and serum bicarbonate 224. Na 141 and Cl 108. Mg 1,8. Documented stool output 60 ml and urine 300 ml over last 24 H.   Continue dextrose and balanced electrolyte solutions at 50 ml per H, K correction with Kcl IV 40 meq and Mg correction with 2 g Mag sulfate.   Follow renal panel in am. Avoid hypotension and nephrotoxic medications.   3.Hypertension.Continue holding blood pressure medications. Systolic blood pressure has been 100 to 120 mmHg.   4.Coronary artery disease.No current active chest pain,  5. Hypothyroid.On oral levothyroxine   6. Anemia related to malignancy.Hgb stable at 9,7 with Hct at 30,2. Continue with close follow up.    Status is: Inpatient  Remains inpatient appropriate because:IV treatments appropriate due to intensity of illness or inability to take PO   Dispo: The patient is from: Home  Anticipated d/c is to: Home              Anticipated d/c date is: 2 days              Patient currently is not medically stable to d/c. Plan for discharge home with home health services, when improvement in leukocytosis and continue toleration of po intake.     DVT prophylaxis:  enoxaparin   Code Status:   dnr   Family Communication:  No family at the bedside      Nutrition Status: Nutrition Problem: Increased nutrient needs Etiology: cancer and cancer related treatments Signs/Symptoms: estimated needs Interventions: Ensure Enlive (each supplement provides 350kcal and 20 grams of protein), MVI, Magic cup   Consultants:   Surgery   Procedures:   palliative transverse loop colostomy creation.     Antimicrobials:   Zosyn     Subjective: Patient is out of bed to chair, no nausea or vomiting, no chest pain or dyspnea, continue to have abdominal distention.   Objective: Vitals:   01/27/20 0453 01/27/20 1212 01/27/20 1923 01/28/20 0342  BP: 117/64 (!) 100/58 121/65 122/76  Pulse: 92 81 89 84  Resp:  20 18 18   Temp: 98.3 F (36.8 C) 98.3 F (36.8 C) 97.7 F (36.5 C) 98 F (36.7 C)  TempSrc: Oral Oral Oral   SpO2: 94% 97% 92% 91%  Weight:      Height:        Intake/Output Summary (Last 24 hours) at 01/28/2020 1208 Last data filed at 01/28/2020 1131 Gross per 24 hour  Intake 3822.92 ml  Output 910 ml  Net 2912.92 ml   Filed Weights   01/24/20 1220 01/26/20 0910  Weight: 57.2 kg 57.2 kg    Examination:   General: Not in pain or dyspnea, deconditioned  Neurology: Awake and alert, non focal  E ENT: positive pallor, no icterus, oral mucosa moist Cardiovascular: No JVD. S1-S2 present, rhythmic, no gallops, rubs, or murmurs. Trace lower extremity edema. Pulmonary: positive breath sounds bilaterally, adequate air movement, no wheezing, rhonchi or rales. Gastrointestinal. Abdomen distended but not tender, colostomy in place with liquid stool present,brown in  color.  Skin. No rashes Musculoskeletal: no joint deformities     Data Reviewed: I have personally reviewed following labs and imaging studies  CBC: Recent Labs  Lab 01/24/20 1050 01/24/20 1050 01/24/20 1237 01/24/20 1237 01/24/20 2237 01/25/20 0711 01/26/20 0729 01/27/20 0520 01/28/20 0712  WBC 23.2*   < > 24.0*   < > 24.1* 24.6* 24.9* 22.9* 26.9*  NEUTROABS 19.7*  --  19.9*  --   --   --  20.5* 19.1* 22.0*  HGB 10.4*   < > 10.5*   < > 9.1* 10.1* 10.2* 9.4* 9.7*  HCT 29.8*   < > 32.2*   < > 27.1* 29.0* 30.3* 28.9* 30.2*  MCV 88.4   < > 92.8   < > 92.5 89.8 90.4 93.5 95.3  PLT 72*   < > 72*   < > 74* 67* 96* 92* 103*   < > = values in this interval not displayed.   Basic Metabolic Panel: Recent Labs  Lab 01/22/20 1424 01/24/20 1050 01/24/20 1237 01/25/20 0711 01/26/20 0729 01/27/20 0520 01/28/20 0712  NA 131*   < > 133* 135 140 142 141  K 3.8   < > 4.0 3.4* 4.2 3.0* 3.4*  CL 96*   < > 97* 100 110 110 108  CO2 24   < >  20* 21* 29 27 24   GLUCOSE 163*   < > 84 124* 154* 143* 116*  BUN 60*   < > 77* 69* 50* 39* 37*  CREATININE 1.51*   < > 2.07* 1.77* 1.63* 1.15* 1.25*  CALCIUM 8.4*   < > 8.0* 7.6* 7.9* 7.1* 7.9*  MG 1.9  --  2.2  --  2.1  --  1.8   < > = values in this interval not displayed.   GFR: Estimated Creatinine Clearance: 28.9 mL/min (A) (by C-G formula based on SCr of 1.25 mg/dL (H)). Liver Function Tests: Recent Labs  Lab 01/22/20 1424 01/24/20 1050 01/24/20 1237  AST 15 20 23   ALT 11 11 10   ALKPHOS 48 61 63  BILITOT 0.8 1.0 1.4*  PROT 6.8 6.3* 6.0*  ALBUMIN 3.5 3.1* 2.9*   Recent Labs  Lab 01/24/20 1237  LIPASE 39   No results for input(s): AMMONIA in the last 168 hours. Coagulation Profile: No results for input(s): INR, PROTIME in the last 168 hours. Cardiac Enzymes: No results for input(s): CKTOTAL, CKMB, CKMBINDEX, TROPONINI in the last 168 hours. BNP (last 3 results) No results for input(s): PROBNP in the last 8760  hours. HbA1C: No results for input(s): HGBA1C in the last 72 hours. CBG: No results for input(s): GLUCAP in the last 168 hours. Lipid Profile: No results for input(s): CHOL, HDL, LDLCALC, TRIG, CHOLHDL, LDLDIRECT in the last 72 hours. Thyroid Function Tests: No results for input(s): TSH, T4TOTAL, FREET4, T3FREE, THYROIDAB in the last 72 hours. Anemia Panel: No results for input(s): VITAMINB12, FOLATE, FERRITIN, TIBC, IRON, RETICCTPCT in the last 72 hours.    Radiology Studies: I have reviewed all of the imaging during this hospital visit personally     Scheduled Meds: . Chlorhexidine Gluconate Cloth  6 each Topical Daily  . enoxaparin (LOVENOX) injection  30 mg Subcutaneous Q24H  . levothyroxine  25 mcg Oral Q0600  . pantoprazole  40 mg Oral Daily   Continuous Infusions: . sodium chloride Stopped (01/26/20 0542)  . dextrose 5% lactated ringers 50 mL/hr at 01/28/20 0848  . piperacillin-tazobactam 3.375 g (01/28/20 0518)     LOS: 4 days        Melia Hopes Gerome Apley, MD

## 2020-01-28 NOTE — Progress Notes (Signed)
PHARMACIST - PHYSICIAN COMMUNICATION  CONCERNING:  Enoxaparin (Lovenox) for DVT Prophylaxis    RECOMMENDATION: Patient was prescribed enoxaprin 40mg  q24 hours for VTE prophylaxis.   Filed Weights   01/24/20 1220 01/26/20 0910  Weight: 57.2 kg (126 lb) 57.2 kg (126 lb)    Body mass index is 21.63 kg/m.  Estimated Creatinine Clearance: 28.4 mL/min (A) (by C-G formula based on SCr of 1.25 mg/dL (H)).  Patient is candidate for enoxaparin 30mg  every 24 hours based on CrCl <3ml/min   DESCRIPTION: Pharmacy has adjusted enoxaparin dose per [facility] policy.  Patient is now receiving enoxaparin 30mg  every 24 hours    Pernell Dupre, PharmD, BCPS Clinical Pharmacist 01/28/2020 11:17 AM

## 2020-01-28 NOTE — Evaluation (Signed)
Physical Therapy Evaluation Patient Details Name: Sherri [name] MRN: [id number] DOB: Mar 02, 1933 Today's Date: 01/28/2020   History of Present Illness  Patient is an 86 year old female who presents for colonic obstruction with PMH of HTN, CAD, GERD, mitral insufficency, ovarian cancer (12/2019) started chemo on 01/16/20, ,colitis, CKD stage III, HOH, osteopenia, diverticulitis, dysrhythmia,  and palliative care. Patient underwent colostomy of ROQ on 01/26/20. Patient lives with brother and sister in law who help with ADLs.    Clinical Impression  Patient is a very pleasant 86 year old female with newly placed ROQ colostomy who presents with generalized weakness and instability. Patient started chemo on 01/16/20 for her ovarian cancer (12/2019) and lives at home with her brother and his wife. She has a son in [place] and another son and daughter who live out of state. Patient reports she has been more weak lately and her brother and sister in law have been helping with more ADL's such as bathing and dressing. Reports she doesn't use a walker at baseline but hasn't been able to walk much due to weakness. Patient is able to follow simple and complex commands and educated on use of RW for transfers and mobility with Mod cueing required. She is able to perform a STS with CGA and use of RW as well as ambulate ~35 ft with RW and CGA with cues for AD. Upon standing with RW she is able to utilize one hand to help don briefs for ambulation due to incontinence without LOB.  She fatigued and required return to chair however was eager to participate in seated strengthening interventions once she "caught her second wind". Patient's chair alarm set and needs met. Patient will benefit from skilled physical therapy while hospitalized to increase strength, capacity for mobility, and stability. Upon discharge patient would benefit from an aide while OOB and Home Health Physical Therapy to address deficits and improve quality  of life while reducing risk of falling. Patient will also require use of a RW upon discharge.     Follow Up Recommendations Home health PT;Supervision for mobility/OOB    Equipment Recommendations  Rolling walker with 5" wheels    Recommendations for Other Services       Precautions / Restrictions Precautions Precautions: Fall;Other (comment) Precaution Comments: colostomy Restrictions Weight Bearing Restrictions: No      Mobility  Bed Mobility               General bed mobility comments: Patient in chair upon PT arrival.  Transfers Overall transfer level: Modified independent Equipment used: Rolling walker (2 wheeled)             General transfer comment: Patient transfers STS with RW with CGA/ supervision.  Ambulation/Gait Ambulation/Gait assistance: Min guard Gait Distance (Feet): 35 Feet Assistive device: Rolling walker (2 wheeled) Gait Pattern/deviations: Step-through pattern;Narrow base of support;Shuffle Gait velocity: decreased   General Gait Details: Patient is unfamiliar with ambulating with RW and requires mod cueing for use of AD, is fatigued by ambulating around room but able to perform with short shuffle step pattern safely.  Stairs            Wheelchair Mobility    Modified Rankin (Stroke Patients Only)       Balance Overall balance assessment: Needs assistance Sitting-balance support: Feet supported Sitting balance-Leahy Scale: Fair Sitting balance - Comments: able to retain COM with feet supported   Standing balance support: Single extremity supported;Bilateral upper extremity supported;During functional activity Standing balance-Leahy Scale: Fair  Standing balance comment: Patient is able to static stand with one hand support on RW and opp UE to aide PT in donning briefs for ambulation. Requires use of RW for ambulation in room however due to unsteadiness on feet with movement.                              Pertinent Vitals/Pain Pain Assessment: No/denies pain    Home Living Family/patient expects to be discharged to:: Private residence Living Arrangements: Other relatives (brother and sister in [place]) Available Help at Discharge: Family Type of Home: House Home Access: Stairs to enter Entrance Stairs-Rails: None Entrance Stairs-Number of Steps: 7 steps Home Layout: Two level;Able to live on main level with bedroom/bathroom   Additional Comments: Patient reports she does not use an AD at home. Is a little confused with history changing her answers with repetition.    Prior Function Level of Independence: Needs assistance   Gait / Transfers Assistance Needed: per patient she does not use an AD at home.  ADL's / Homemaking Assistance Needed: Per patient her brother and sister in law do all homemaking activities and helps her with her bathing and dressing.  Comments: Patient has become weak requiring more help from her brother and sister in law recently.     Hand Dominance        Extremity/Trunk Assessment   Upper Extremity Assessment Upper Extremity Assessment: Defer to OT evaluation    Lower Extremity Assessment Lower Extremity Assessment: Generalized weakness (grossly 3+/5 with fatigue against resistance; limited gross motor coordination with heel to shin test)       Communication   Communication: HOH  Cognition Arousal/Alertness: Awake/alert Behavior During Therapy: WFL for tasks assessed/performed Overall Cognitive Status: No family/caregiver present to determine baseline cognitive functioning                                 General Comments: Patient is very pleasant and eager to participate in therapy. She is able to follow simple and complex commands however has difficulty with history recall in regards to living situation and level of mobility.      General Comments General comments (skin integrity, edema, etc.): patient appears well groomed and  frail.    Exercises General Exercises - Lower Extremity Ankle Circles/Pumps: Both;Strengthening;10 reps;Seated Long Arc Quad: Strengthening;Both;5 reps;Seated Hip Flexion/Marching: Strengthening;Both;10 reps;Standing Other Exercises Other Exercises: Patient educated on role of PT in acute care setting, safe mobility and transfers, as well as use of AD with ambulation. She assisted with donning of brief for ambulation and was able to maintain stability with single UE support. Seated exercises performed at end of session due to fatigue in standing.   Assessment/Plan    PT Assessment Patient needs continued PT services  PT Problem List Decreased strength;Decreased activity tolerance;Decreased balance;Decreased knowledge of use of DME;Decreased coordination;Decreased mobility;Decreased safety awareness       PT Treatment Interventions DME instruction;Gait training;Stair training;Functional mobility training;Neuromuscular re-education;Balance training;Therapeutic exercise;Therapeutic activities;Patient/family education    PT Goals (Current goals can be found in the Care Plan section)  Acute Rehab PT Goals Patient Stated Goal: to get stronger and be able to walk more PT Goal Formulation: With patient Time For Goal Achievement: 02/11/20 Potential to Achieve Goals: Fair    Frequency Min 2X/week   Barriers to discharge   will benefit from home aide and HHPT  Co-evaluation               AM-PAC PT "6 Clicks" Mobility  Outcome Measure Help needed turning from your back to your side while in a flat bed without using bedrails?: None Help needed moving from lying on your back to sitting on the side of a flat bed without using bedrails?: A Little Help needed moving to and from a bed to a chair (including a wheelchair)?: A Little Help needed standing up from a chair using your arms (e.g., wheelchair or bedside chair)?: A Little Help needed to walk in hospital room?: A Little Help  needed climbing 3-5 steps with a railing? : A Little 6 Click Score: 19    End of Session Equipment Utilized During Treatment: Gait belt Activity Tolerance: Patient tolerated treatment well;Patient limited by fatigue Patient left: in chair;with call bell/phone within reach;with chair alarm set Nurse Communication: Mobility status PT Visit Diagnosis: Unsteadiness on feet (R26.81);Other abnormalities of gait and mobility (R26.89);Muscle weakness (generalized) (M62.81);Difficulty in walking, not elsewhere classified (R26.2)    Time: 1010-1034 PT Time Calculation (min) (ACUTE ONLY): 24 min   Charges:   PT Evaluation $PT Eval Low Complexity: 1 Low PT Treatments $Therapeutic Exercise: 8-22 mins      Janna Arch, PT, DPT   01/28/2020, 11:17 AM

## 2020-01-28 NOTE — Progress Notes (Signed)
Subjective:  CC: Sherri [name] is a 86 y.o. female  Hospital stay day 4, 2 Days Post-Op transverse loop colostomy for obstructing metastatic ovarian CA  HPI: No acute issues overnight.  ROS:  General: Denies weight loss, weight gain, fatigue, fevers, chills, and night sweats. Heart: Denies chest pain, palpitations, racing heart, irregular heartbeat, leg pain or swelling, and decreased activity tolerance. Respiratory: Denies breathing difficulty, shortness of breath, wheezing, cough, and sputum. GI: Denies change in appetite, heartburn, nausea, vomiting, constipation, diarrhea, and blood in stool. GU: Denies difficulty urinating, pain with urinating, urgency, frequency, blood in urine.   Objective:   Temp:  [97.7 F (36.5 C)-98.3 F (36.8 C)] 98 F (36.7 C) (08/29 0342) Pulse Rate:  [81-89] 84 (08/29 0342) Resp:  [18-20] 18 (08/29 0342) BP: (100-122)/(58-76) 122/76 (08/29 0342) SpO2:  [91 %-97 %] 91 % (08/29 0342)     Height: 5\' 4"  (162.6 cm) Weight: 57.2 kg BMI (Calculated): 21.62   Intake/Output this shift:   Intake/Output Summary (Last 24 hours) at 01/28/2020 1119 Last data filed at 01/28/2020 1016 Gross per 24 hour  Intake 3822.92 ml  Output 810 ml  Net 3012.92 ml    Constitutional :  alert, cooperative, appears stated age and no distress  Respiratory:  clear to auscultation bilaterally  Cardiovascular:  regular rate and rhythm  Gastrointestinal: soft, no guarding ostomy is patent with liquid stool and gas.   Skin: Cool and moist.   Psychiatric: Normal affect, non-agitated, not confused       LABS:  CMP Latest Ref Rng & Units 01/28/2020 01/27/2020 01/26/2020  Glucose 70 - 99 mg/dL 116(H) 143(H) 154(H)  BUN 8 - 23 mg/dL 37(H) 39(H) 50(H)  Creatinine 0.44 - 1.00 mg/dL 1.25(H) 1.15(H) 1.63(H)  Sodium 135 - 145 mmol/L 141 142 140  Potassium 3.5 - 5.1 mmol/L 3.4(L) 3.0(L) 4.2  Chloride 98 - 111 mmol/L 108 110 110  CO2 22 - 32 mmol/L 24 27 29   Calcium 8.9 - 10.3 mg/dL  7.9(L) 7.1(L) 7.9(L)  Total Protein 6.5 - 8.1 g/dL - - -  Total Bilirubin 0.3 - 1.2 mg/dL - - -  Alkaline Phos 38 - 126 U/L - - -  AST 15 - 41 U/L - - -  ALT 0 - 44 U/L - - -   CBC Latest Ref Rng & Units 01/28/2020 01/27/2020 01/26/2020  WBC 4.0 - 10.5 K/uL 26.9(H) 22.9(H) 24.9(H)  Hemoglobin 12.0 - 15.0 g/dL 9.7(L) 9.4(L) 10.2(L)  Hematocrit 36 - 46 % 30.2(L) 28.9(L) 30.3(L)  Platelets 150 - 400 K/uL 103(L) 92(L) 96(L)    RADS: n/a Assessment:   S/p  transverse loop colostomy for obstructing metastatic ovarian CA.  Doing well, continued good output from ostomy.  Can ADAT.  Further management per hospitalist

## 2020-01-28 NOTE — TOC Progression Note (Signed)
Transition of Care [facility]) - Progression Note    Patient Details  Name: Sherri [name] MRN: [id number] Date of Birth: [birth year]  Transition of Care [facility]) CM/SW [place], LCSW Phone Number: 01/28/2020, 2:20 PM  Clinical Narrative:    PT/OT recommending HH/PT/OT. Patient offered list of agencies, but stated she had not used HH before and does not have a preference. Based on patient insurance CSW made referral to [facility] did not arrange for DME yet.        Expected Discharge Plan and Services  Discharge home with home health with [facility]                                               Social Determinants of Health (SDOH) Interventions    Readmission Risk Interventions No flowsheet data found.

## 2020-01-28 NOTE — Evaluation (Signed)
Occupational Therapy Evaluation Patient Details Name: Sherri [name] MRN: [id number] DOB: [birth year] Today's Date: 01/28/2020    History of Present Illness Patient is an 86 year old female who presents for colonic obstruction with PMH of HTN, CAD, GERD, mitral insufficency, ovarian cancer (12/2019) started chemo on 01/16/20, ,colitis, CKD stage III, HOH, osteopenia, diverticulitis, dysrhythmia,  and palliative care. Patient underwent colostomy of ROQ on 01/26/20. Patient lives with brother and sister in law who help with ADLs.   Clinical Impression   Sherri [name] was seen for OT evaluation this date. Prior to hospital admission, pt was Independent for mobility, endorses progressive weakness and furniture walking, required assist from brother/sister in law for bathing/dressing. Pt lives c family in 2 level home, able to live on main level. Pt presents to acute OT demonstrating impaired ADL performance and functional mobility 2/2 decreased safety awareness, functional strength/balance deficits, and decreased activity tolerance. Pt currently requires MIN A + RW sit<>stand at chair - assist for stabilizing RW, decreasing to MOD A standing w/o RW use. SBA + RW tooth brushing standing sinkside. SBA for LBD seated in chair.  Pt would benefit from skilled OT to address noted impairments and functional limitations (see below for any additional details) in order to maximize safety and independence while minimizing falls risk and caregiver burden. Upon hospital discharge, recommend HHOT to maximize pt safety and return to functional independence during meaningful occupations of daily life.     Follow Up Recommendations  Home health OT    Equipment Recommendations  3 in 1 bedside commode    Recommendations for Other Services       Precautions / Restrictions Precautions Precautions: Fall;Other (comment) Precaution Comments: colostomy Restrictions Weight Bearing Restrictions: No      Mobility Bed  Mobility     General bed mobility comments: Pt received and left up in chair   Transfers Overall transfer level: Needs assistance Equipment used: Rolling walker (2 wheeled) Transfers: Sit to/from Stand Sit to Stand: Min assist  General transfer comment: MIN A + RW sit<>stand (assist for RW stabilization) decreasing to MOD A w/o RW use    Balance Overall balance assessment: Needs assistance Sitting-balance support: Feet supported Sitting balance-Leahy Scale: Good Sitting balance - Comments: able to retain COM with feet supported   Standing balance support: Single extremity supported;During functional activity Standing balance-Leahy Scale: Fair Standing balance comment: Single UE support standing grooming           ADL either performed or assessed with clinical judgement   ADL Overall ADL's : Needs assistance/impaired        General ADL Comments: SBA + RW tooth brushing standing sinkside. SBA for LBD seated in chair      Pertinent Vitals/Pain Pain Assessment: No/denies pain     Hand Dominance Right   Extremity/Trunk Assessment Upper Extremity Assessment Upper Extremity Assessment: Generalized weakness   Lower Extremity Assessment Lower Extremity Assessment: Generalized weakness       Communication Communication Communication: HOH   Cognition Arousal/Alertness: Awake/alert Behavior During Therapy: WFL for tasks assessed/performed Overall Cognitive Status: No family/caregiver present to determine baseline cognitive functioning        General Comments: Patient is very pleasant and eager to participate in therapy. She is able to follow simple and complex commands however has difficulty with history recall. Oriented to self, situation, and location (c VCs)   General Comments  patient appears well groomed and frail.    Exercises Exercises: Other exercises Other Exercises Other Exercises: Pt  educated re: OT role, DME recs, d/c recs, importance of mobility for  functional strengthening, falls preventions Other Exercises: LBD, grooming, sit<>stand x2, sitting/standing balance/tolerance   Shoulder Instructions      Home Living Family/patient expects to be discharged to:: Private residence Living Arrangements: Other relatives (brother and sister in law) Available Help at Discharge: Family Type of Home: House Home Access: Stairs to enter [employer] of Steps: 7 steps Entrance Stairs-Rails: None Home Layout: Two level;Able to live on main level with bedroom/bathroom         Bathroom Toilet: Standard         Additional Comments: Mildly confused historian       Prior Functioning/Environment Level of Independence: Needs assistance  Gait / Transfers Assistance Needed: per patient she does not use an AD at home. ADL's / Homemaking Assistance Needed: Per patient her brother and sister in law do all homemaking activities and helps her with her bathing and dressing.   Comments: Patient has become weak requiring more help from her brother and sister in law recently.        OT Problem List: Decreased strength;Decreased activity tolerance;Impaired balance (sitting and/or standing);Decreased knowledge of use of DME or AE;Decreased safety awareness      OT Treatment/Interventions: Self-care/ADL training;Therapeutic exercise;Energy conservation;DME and/or AE instruction;Therapeutic activities;Patient/family education;Balance training;Cognitive remediation/compensation    OT Goals(Current goals can be found in the care plan section) Acute Rehab OT Goals Patient Stated Goal: to get stronger and be able to walk more OT Goal Formulation: With patient Time For Goal Achievement: 02/11/20 Potential to Achieve Goals: Good ADL Goals Pt Will Perform Grooming: with modified independence;standing (c LRAD PRN) Pt Will Perform Lower Body Dressing: with modified independence;sit to/from stand (c LRAD PRN) Pt Will Transfer to Toilet: with modified  independence;ambulating;regular height toilet (c LRAD PRN)  OT Frequency: Min 1X/week   Barriers to D/C: Inaccessible home environment          AM-PAC OT "6 Clicks" Daily Activity     Outcome Measure Help from another person eating meals?: None Help from another person taking care of personal grooming?: A Little Help from another person toileting, which includes using toliet, bedpan, or urinal?: A Little Help from another person bathing (including washing, rinsing, drying)?: A Little Help from another person to put on and taking off regular upper body clothing?: A Little Help from another person to put on and taking off regular lower body clothing?: A Little 6 Click Score: 19   End of Session Equipment Utilized During Treatment: Rolling walker  Activity Tolerance: Patient tolerated treatment well Patient left: in chair;with call bell/phone within reach;with chair alarm set  OT Visit Diagnosis: Other abnormalities of gait and mobility (R26.89);Muscle weakness (generalized) (M62.81)                Time: 1610-9604 OT Time Calculation (min): 18 min Charges:  OT General Charges $OT Visit: 1 Visit OT Evaluation $OT Eval Low Complexity: 1 Low OT Treatments $Self Care/Home Management : 8-22 mins  Dessie Coma, M.S. OTR/L  01/28/20, 12:53 PM  ascom [phone]

## 2020-01-29 DIAGNOSIS — E43 Unspecified severe protein-calorie malnutrition: Secondary | ICD-10-CM | POA: Insufficient documentation

## 2020-01-29 LAB — CBC WITH DIFFERENTIAL/PLATELET
Abs Immature Granulocytes: 0.62 10*3/uL — ABNORMAL HIGH (ref 0.00–0.07)
Basophils Absolute: 0.1 10*3/uL (ref 0.0–0.1)
Basophils Relative: 1 %
Eosinophils Absolute: 0.1 10*3/uL (ref 0.0–0.5)
Eosinophils Relative: 0 %
HCT: 28.8 % — ABNORMAL LOW (ref 36.0–46.0)
Hemoglobin: 9.3 g/dL — ABNORMAL LOW (ref 12.0–15.0)
Immature Granulocytes: 3 %
Lymphocytes Relative: 9 %
Lymphs Abs: 2.2 10*3/uL (ref 0.7–4.0)
MCH: 30.7 pg (ref 26.0–34.0)
MCHC: 32.3 g/dL (ref 30.0–36.0)
MCV: 95 fL (ref 80.0–100.0)
Monocytes Absolute: 0.9 10*3/uL (ref 0.1–1.0)
Monocytes Relative: 4 %
Neutro Abs: 20.1 10*3/uL — ABNORMAL HIGH (ref 1.7–7.7)
Neutrophils Relative %: 83 %
Platelets: 121 10*3/uL — ABNORMAL LOW (ref 150–400)
RBC: 3.03 MIL/uL — ABNORMAL LOW (ref 3.87–5.11)
RDW: 15.7 % — ABNORMAL HIGH (ref 11.5–15.5)
Smear Review: NORMAL
WBC: 24.1 10*3/uL — ABNORMAL HIGH (ref 4.0–10.5)
nRBC: 0 % (ref 0.0–0.2)

## 2020-01-29 LAB — BASIC METABOLIC PANEL
Anion gap: 7 (ref 5–15)
BUN: 30 mg/dL — ABNORMAL HIGH (ref 8–23)
CO2: 24 mmol/L (ref 22–32)
Calcium: 7.9 mg/dL — ABNORMAL LOW (ref 8.9–10.3)
Chloride: 108 mmol/L (ref 98–111)
Creatinine, Ser: 1.09 mg/dL — ABNORMAL HIGH (ref 0.44–1.00)
GFR calc Af Amer: 54 mL/min — ABNORMAL LOW (ref >60)
GFR calc non Af Amer: 47 mL/min — ABNORMAL LOW (ref >60)
Glucose, Bld: 103 mg/dL — ABNORMAL HIGH (ref 70–99)
Potassium: 3.7 mmol/L (ref 3.5–5.1)
Sodium: 139 mmol/L (ref 135–145)

## 2020-01-29 LAB — GLUCOSE, CAPILLARY
Glucose-Capillary: 115 mg/dL — ABNORMAL HIGH (ref 70–99)
Glucose-Capillary: 120 mg/dL — ABNORMAL HIGH (ref 70–99)
Glucose-Capillary: 116 mg/dL — ABNORMAL HIGH (ref 70–99)

## 2020-01-29 LAB — MAGNESIUM: Magnesium: 2 mg/dL (ref 1.7–2.4)

## 2020-01-29 MED ORDER — POTASSIUM CHLORIDE CRYS ER 20 MEQ PO TBCR
40.0000 meq | EXTENDED_RELEASE_TABLET | Freq: Once | ORAL | Status: AC
  Administered 2020-01-29: 40 meq via ORAL
  Filled 2020-01-29: qty 2

## 2020-01-29 MED ORDER — ADULT MULTIVITAMIN W/MINERALS CH
1.0000 | ORAL_TABLET | Freq: Every day | ORAL | Status: DC
  Administered 2020-01-30: 1 via ORAL
  Filled 2020-01-29: qty 1

## 2020-01-29 MED ORDER — ENOXAPARIN SODIUM 40 MG/0.4ML ~~LOC~~ SOLN
40.0000 mg | SUBCUTANEOUS | Status: DC
  Administered 2020-01-29: 40 mg via SUBCUTANEOUS
  Filled 2020-01-29: qty 0.4

## 2020-01-29 MED ORDER — ENSURE ENLIVE PO LIQD
237.0000 mL | Freq: Two times a day (BID) | ORAL | Status: DC
  Administered 2020-01-29 – 2020-01-30 (×2): 237 mL via ORAL

## 2020-01-29 NOTE — Progress Notes (Signed)
[place]  [phone] [phone]   Name: Sherri [name] Date: 01/29/2020 MRN: [id number]  DOB: [birth year]  Patient Care Team: [name], MD as PCP - General (Family Medicine) Lloyd Huger, MD as Consulting Physician (Oncology) Clent Jacks, RN as Oncology Nurse Navigator    REASON FOR CONSULTATION: Sherri [name] is a 86 y.o. female with multiple medical problems including history of stage IIb breast cancer status post lumpectomy and XRT with completion of 5 years of Arimidex in October 2019. She was subsequently diagnosed with stage IV ovarian cancer in June 2021. PET scan on 12/11/2019 revealed diffuse pulmonary metastatic disease with extensive retroperitoneal lymphadenopathy and right ureter obstructed by her pelvic mass with right-sided hydronephrosis. Currently on systemic chemotherapy.  Patient was admitted to the hospital on 01/24/2020 with progressive abdominal pain and distention.  CT of abdomen and pelvis revealed colonic obstruction secondary to her large right pelvic malignancy.  She is pending diverting loop colostomy on 8/27 for palliation.  Palliative care was consulted to help address goals and manage ongoing symptoms..   CODE STATUS: DNR  PAST MEDICAL HISTORY: Past Medical History:  Diagnosis Date  . Anxiety   . Arthritis   . Breast cancer ([place]) 2013   RIGHT lumpectomy  . Bursitis of elbow    RIGHT  . Cancer [facility]) 2013   right breast lumpectomy with a few nodes removed  . Chronic kidney disease    stage 3  . Colitis   . Coronary artery disease   . Diverticulitis   . Dysrhythmia    according to anxiety, tachy  . Family history of breast cancer   . Family history of melanoma   . GERD (gastroesophageal reflux disease)    okay since took her gallbladder out  . H/O lumpectomy   . Heart disease   . History of breast cancer   . History of coronary artery stent  placement 2007   one stent  . HOH (hard of hearing)   . Hypercholesteremia   . Hypertension   . Hypothyroidism   . Myocardial infarction ([place])    2007  . Osteopenia   . Palpitations   . Personal history of radiation therapy   . Skin cancer     PAST SURGICAL HISTORY:  Past Surgical History:  Procedure Laterality Date  . BREAST LUMPECTOMY Right 2013   lymph node dissection  . CATARACT EXTRACTION W/PHACO Right 07/23/2015   Procedure: CATARACT EXTRACTION PHACO AND INTRAOCULAR LENS PLACEMENT (IOC);  Surgeon: Birder Robson, MD;  Location: [facility] ORS;  Service: Ophthalmology;  Laterality: Right;  [place] 01:24 AP% 26.7 CDE 22.47 fluid pack lot # [id number]  . CATARACT EXTRACTION W/PHACO Left 08/13/2015   Procedure: CATARACT EXTRACTION PHACO AND INTRAOCULAR LENS PLACEMENT (IOC);  Surgeon: Birder Robson, MD;  Location: [facility] ORS;  Service: Ophthalmology;  Laterality: Left;  [place] 02:40 AP% 30.7 CDE 49.28 fluid pack lot # [id number]  . CHOLECYSTECTOMY    . COLONOSCOPY    . CORONARY ANGIOPLASTY  2007  . EYE SURGERY     bilateral cataract  . HARDWARE REMOVAL Right 04/29/2016   Procedure: HARDWARE REMOVAL;  Surgeon: Hessie Knows, MD;  Location: [facility] ORS;  Service: Orthopedics;  Laterality: Right;  . IR IMAGING GUIDED PORT INSERTION  01/12/2020  . ORIF ELBOW FRACTURE Right 10/01/2015   Procedure: OPEN REDUCTION INTERNAL FIXATION (ORIF) ELBOW/OLECRANON FRACTURE;  Surgeon: Hessie Knows, MD;  Location: [facility] ORS;  Service: Orthopedics;  Laterality: Right;  .  TRANSVERSE LOOP COLOSTOMY N/A 01/26/2020   Procedure: TRANSVERSE LOOP COLOSTOMY;  Surgeon: Ronny Bacon, MD;  Location: [facility] ORS;  Service: General;  Laterality: N/A;  . VIDEO BRONCHOSCOPY WITH ENDOBRONCHIAL NAVIGATION N/A 01/01/2020   Procedure: VIDEO BRONCHOSCOPY WITH ENDOBRONCHIAL NAVIGATION WITH CELLZIZIO;  Surgeon: Tyler Pita, MD;  Location: [facility] ORS;  Service: Pulmonary;  Laterality: N/A;  . VIDEO BRONCHOSCOPY WITH ENDOBRONCHIAL  ULTRASOUND N/A 01/01/2020   Procedure: VIDEO BRONCHOSCOPY WITH ENDOBRONCHIAL ULTRASOUND;  Surgeon: Tyler Pita, MD;  Location: [facility] ORS;  Service: Pulmonary;  Laterality: N/A;    HEMATOLOGY/ONCOLOGY HISTORY:  Oncology History  Ovarian cancer ([place])  01/05/2020 Initial Diagnosis   Ovarian cancer ([place])   01/12/2020 Cancer Staging   Staging form: Ovary, Fallopian Tube, and Primary Peritoneal Carcinoma, AJCC 8th Edition - Clinical: FIGO Stage IVB (cTX, cN1b, pM1b) - Signed by Lloyd Huger, MD on 01/12/2020   01/16/2020 -  Chemotherapy   The patient had palonosetron (ALOXI) injection 0.25 mg, 0.25 mg, Intravenous,  Once, 1 of 6 cycles Administration: 0.25 mg (01/16/2020) pegfilgrastim (NEULASTA ONPRO KIT) injection 6 mg, 6 mg, Subcutaneous, Once, 1 of 6 cycles Administration: 6 mg (01/16/2020) CARBOplatin (PARAPLATIN) 210 mg in sodium chloride 0.9 % 250 mL chemo infusion, 210 mg (103.7 % of original dose 204.4 mg), Intravenous,  Once, 1 of 6 cycles Dose modification:   (original dose 204.4 mg, Cycle 1) Administration: 210 mg (01/16/2020) fosaprepitant (EMEND) 150 mg in sodium chloride 0.9 % 145 mL IVPB, 150 mg, Intravenous,  Once, 1 of 6 cycles Administration: 150 mg (01/16/2020) PACLitaxel (TAXOL) 180 mg in sodium chloride 0.9 % 250 mL chemo infusion (> $RemoveBef'80mg'OUicUHZRFa$ /m2), 110 mg/m2 = 180 mg (100 % of original dose 110 mg/m2), Intravenous,  Once, 1 of 6 cycles Dose modification: 110 mg/m2 (original dose 110 mg/m2, Cycle 1, Reason: Patient Age) Administration: 180 mg (01/16/2020)  for chemotherapy treatment.      ALLERGIES:  is allergic to prednisone.  MEDICATIONS:  Current Facility-Administered Medications  Medication Dose Route Frequency Provider Last Rate Last Admin  . 0.9 %  sodium chloride infusion   Intravenous PRN Ronny Bacon, MD   Stopped at 01/26/20 0542  . Chlorhexidine Gluconate Cloth 2 % PADS 6 each  6 each Topical Daily Ronny Bacon, MD   6 each at 01/27/20 0950  .  enoxaparin (LOVENOX) injection 40 mg  40 mg Subcutaneous Q24H Rauer, Forde Dandy, RPH      . feeding supplement (ENSURE ENLIVE) (ENSURE ENLIVE) liquid 237 mL  237 mL Oral BID BM Arrien, Jimmy Picket, MD   237 mL at 01/29/20 1357  . levothyroxine (SYNTHROID) tablet 25 mcg  25 mcg Oral Q0600 Tawni Millers, MD   25 mcg at 01/29/20 [phone]  . morphine 2 MG/ML injection 2 mg  2 mg Intravenous Q2H PRN Ronny Bacon, MD   2 mg at 01/26/20 1312  . [START ON 01/30/2020] multivitamin with minerals tablet 1 tablet  1 tablet Oral Daily Arrien, Jimmy Picket, MD      . ondansetron [facility]) tablet 4 mg  4 mg Oral Q6H PRN Ronny Bacon, MD       Or  . ondansetron [facility]) injection 4 mg  4 mg Intravenous Q6H PRN Ronny Bacon, MD   4 mg at 01/26/20 1134  . pantoprazole (PROTONIX) EC tablet 40 mg  40 mg Oral Daily Arrien, Jimmy Picket, MD   40 mg at 01/29/20 1055  . piperacillin-tazobactam (ZOSYN) IVPB 3.375 g  3.375 g Intravenous Q8H Ronny Bacon,  MD 12.5 mL/hr at 01/29/20 0514 Rate Verify at 01/29/20 0514    VITAL SIGNS: BP 135/60   Pulse 79   Temp (!) 97.5 F (36.4 C) (Oral)   Resp 16   Ht 5\' 4"  (1.626 m)   Wt 126 lb (57.2 kg)   SpO2 97%   BMI 21.63 kg/m  Filed Weights   01/24/20 1220 01/26/20 0910  Weight: 126 lb (57.2 kg) 126 lb (57.2 kg)    Estimated body mass index is 21.63 kg/m as calculated from the following:   Height as of this encounter: 5\' 4"  (1.626 m).   Weight as of this encounter: 126 lb (57.2 kg).  LABS: CBC:    Component Value Date/Time   WBC 24.1 (H) 01/29/2020 0647   HGB 9.3 (L) 01/29/2020 0647   HCT 28.8 (L) 01/29/2020 0647   PLT 121 (L) 01/29/2020 0647   MCV 95.0 01/29/2020 0647   NEUTROABS 20.1 (H) 01/29/2020 0647   LYMPHSABS 2.2 01/29/2020 0647   MONOABS 0.9 01/29/2020 0647   EOSABS 0.1 01/29/2020 0647   BASOSABS 0.1 01/29/2020 0647   Comprehensive Metabolic Panel:    Component Value Date/Time   NA 139 01/29/2020 0647   K 3.7  01/29/2020 0647   CL 108 01/29/2020 0647   CO2 24 01/29/2020 0647   BUN 30 (H) 01/29/2020 0647   CREATININE 1.09 (H) 01/29/2020 0647   GLUCOSE 103 (H) 01/29/2020 0647   CALCIUM 7.9 (L) 01/29/2020 0647   AST 23 01/24/2020 1237   ALT 10 01/24/2020 1237   ALKPHOS 63 01/24/2020 1237   BILITOT 1.4 (H) 01/24/2020 1237   PROT 6.0 (L) 01/24/2020 1237   ALBUMIN 2.9 (L) 01/24/2020 1237    RADIOGRAPHIC STUDIES: CT ABDOMEN PELVIS WO CONTRAST  Result Date: 01/24/2020 CLINICAL DATA:  Abdominal pain, acute nonlocalized in the setting of metastatic ovarian cancer EXAM: CT ABDOMEN AND PELVIS WITHOUT CONTRAST TECHNIQUE: Multidetector CT imaging of the abdomen and pelvis was performed following the standard protocol without IV contrast. COMPARISON:  Chest CT of November 16, 2019 FINDINGS: Lower chest: Signs of pulmonary metastatic disease and interstitial lung disease. No effusion. Lingular nodule measuring approximately 1.4 cm previously 1.4-1.5 cm. The disease in the lung bases grossly unchanged. Upper lobes not assessed Hepatobiliary: Interval development of ascites since the prior study. No focal, suspicious hepatic lesion. Fluid is mainly about the liver. Pancreas: Pancreas without ductal dilation, inflammation or focal lesion. Spleen: Spleen normal size with lobulated contours. Adrenals/Urinary Tract: Adrenal glands are normal. Marked RIGHT-sided hydroureteronephrosis with chronic appearing RIGHT-sided cortical thinning. RIGHT ureteral involvement from RIGHT pelvic soft tissue mass (image 66, series 2) 4.9 x 3.2 cm, contiguous with RIGHT adnexa and inseparable from with may represent the uterus but is obscured by soft tissue. Distortion also at the RIGHT bladder base on image 77 of series 2. No LEFT-sided hydronephrosis. Cyst in the LEFT kidney. Stomach/Bowel: Distal colonic obstruction with sigmoid narrowing best seen on image 67 of series 2. Irregular margins of the sigmoid colon at this level. Process results in  small bowel dilation. There is a large duodenal diverticulum. Pericolonic stranding throughout. Some nodularity of lateral conal fascia and fascial planes in the abdomen. Omental nodularity seen anterior to the transverse colon on image 52 of series 2. No free air. Tethering of small bowel loops in the pelvis. Small hiatal hernia. Vascular/Lymphatic: Calcified atheromatous plaque of the abdominal aorta. Gastrohepatic adenopathy is suspected. Soft tissue may also be present about the porta hepatis. No aneurysmal dilation. Pelvic  mass encases the RIGHT ureter and external iliac vasculature on the RIGHT. Reproductive: Mass in the RIGHT hemipelvis inseparable from remaining pelvic viscera. Also tethering the upper portion of the urinary bladder and RIGHT bladder wall. Other: Small volume ascites mainly about the liver. Musculoskeletal: Spinal degenerative changes. No acute or destructive bone finding. IMPRESSION: 1. Distal colonic obstruction associated with colitis presumably due to metastatic disease to the surface of the sigmoid colon. Signs of colitis with bowel distension of the colon up to 8 cm. GI and or surgical consultation may be helpful. 2. Mass in the RIGHT hemipelvis obstructs the RIGHT ureter. Chronic thinning of the RIGHT renal cortex in the setting of severe RIGHT-sided hydro ureteral nephrosis. Mass also involves the RIGHT urinary bladder, the uterus and tethers small-bowel loops in the pelvis and adjacent sigmoid colon. 3. Extension of mass into the RIGHT pelvic sidewall as described. 4. Findings that suggest more diffuse peritoneal involvement. A component of the ascitic fluid could also be due to colonic inflammation. 5. Evidence of pulmonary metastatic disease and interstitial lung disease. 6. Signs of pulmonary metastatic disease and interstitial lung disease. 7. Aortic atherosclerosis. Aortic Atherosclerosis (ICD10-I70.0). Electronically Signed   By: Zetta Bills M.D.   On: 01/24/2020 14:28    US Abdomen Limited  Result Date: 01/23/2020 CLINICAL DATA:  Ovarian cancer. Patient with abdominal distension. Evaluate for ascites. EXAM: LIMITED ABDOMEN ULTRASOUND FOR ASCITES TECHNIQUE: Limited ultrasound survey for ascites was performed in all four abdominal quadrants. COMPARISON:  PET-CT 12/11/2019 FINDINGS: No significant ascites. Marked dilatation of the right renal collecting system appears chronic and similar to the recent PET-CT. IMPRESSION: No evidence for ascites. Chronic right hydronephrosis. Electronically Signed   By: Markus Daft M.D.   On: 01/23/2020 15:48   DG Chest Port 1 View  Result Date: 01/01/2020 CLINICAL DATA:  Status post biopsy. EXAM: PORTABLE CHEST 1 VIEW COMPARISON:  November 16, 2019. FINDINGS: The heart size and mediastinal contours are within normal limits. No pneumothorax or pleural effusion is noted. Multiple irregular nodular densities in reticular densities are noted throughout both lungs consistent with metastatic disease as described on prior CT scan. The visualized skeletal structures are unremarkable. IMPRESSION: Multiple irregular nodular densities and reticular densities are noted throughout both lungs consistent with metastatic disease as described on prior CT scan. Electronically Signed   By: Marijo Conception M.D.   On: 01/01/2020 15:43   DG ABD ACUTE 2+V W 1V CHEST  Result Date: 01/25/2020 CLINICAL DATA:  Colonic obstruction EXAM: DG ABDOMEN ACUTE W/ 1V CHEST COMPARISON:  Abdominal CT from yesterday FINDINGS: Low colonic obstruction. A loop in the central abdomen measures up to 7 cm in diameter, essentially stable from scanogram yesterday. No evidence of extraluminal gas collection. Reticulonodular opacities in the bilateral lungs. There is metastatic disease and pulmonary fibrosis at the lung bases by prior CT. Normal heart size. No visible effusion or pneumothorax. IMPRESSION: 1. Low colonic obstruction associated with malignancy. The degree of distension is  similar to CT from yesterday. 2. Known pulmonary metastases with superimposed interstitial lung disease. Electronically Signed   By: Monte Fantasia M.D.   On: 01/25/2020 06:36   DG Abd Portable 1V  Result Date: 01/26/2020 CLINICAL DATA:  Colonic obstruction. EXAM: PORTABLE ABDOMEN - 1 VIEW COMPARISON:  Abdomen 01/25/2020.  CT 01/24/2020. FINDINGS: Persistent diffuse colonic and small bowel distention without interim change from prior exam. Findings consistent with distal colonic obstruction as noted previously on CT of 01/24/2020. No pneumatosis. No free air  noted. Diffuse osteopenia. Degenerative changes lumbar spine and both hips. IMPRESSION: Persistent diffuse colonic and small bowel distention without interim change from prior exam. Findings consistent with distal colonic obstruction as noted on prior CT of 01/24/2020. Electronically Signed   By: Marcello Moores  Register   On: 01/26/2020 05:54   DG C-Arm 1-60 Min-No Report  Result Date: 01/01/2020 Fluoroscopy was utilized by the requesting physician.  No radiographic interpretation.   IR IMAGING GUIDED PORT INSERTION  Result Date: 01/17/2020 CLINICAL DATA:  Ovarian neoplasm, needs durable venous access for planned treatment regimen EXAM: TUNNELED PORT CATHETER PLACEMENT WITH ULTRASOUND AND FLUOROSCOPIC GUIDANCE FLUOROSCOPY TIME:  0.1 minute; 0.31 mGy ANESTHESIA/SEDATION: Intravenous Fentanyl 91mcg and Versed 0.$RemoveBefo'5mg'pnBiVoHMpiy$  were administered as conscious sedation during continuous monitoring of the patient's level of consciousness and physiological / cardiorespiratory status by the radiology RN, with a total moderate sedation time of 32 minutes. TECHNIQUE: The procedure, risks, benefits, and alternatives were explained to the patient. Questions regarding the procedure were encouraged and answered. The patient understands and consents to the procedure. As antibiotic prophylaxis, cefazolin 2 g was ordered pre-procedure and administered intravenously within 1 hour of  incision. Patency of the right IJ vein was confirmed with ultrasound with image documentation. An appropriate skin site was determined. Skin site was marked. Region was prepped using maximum barrier technique including cap and mask, sterile gown, sterile gloves, large sterile sheet, and Chlorhexidine as cutaneous antisepsis. The region was infiltrated locally with 1% lidocaine. Under real-time ultrasound guidance, the right IJ vein was accessed with a 21 gauge micropuncture needle; the needle tip within the vein was confirmed with ultrasound image documentation. Needle was exchanged over a 018 guidewire for transitional dilator, and vascular measurement was performed. A small incision was made on the right anterior chest wall and a subcutaneous pocket fashioned. The power-injectable port was positioned and its catheter tunneled to the right IJ dermatotomy site. The transitional dilator was exchanged over an Amplatz wire for a peel-away sheath, through which the port catheter, which had been trimmed to the appropriate length, was advanced and positioned under fluoroscopy with its tip at the cavoatrial junction. Spot chest radiograph confirms good catheter position and no pneumothorax. The port was flushed per protocol. The pocket was closed with deep interrupted and subcuticular continuous 3-0 Monocryl sutures. The incisions were covered with Dermabond then covered with a sterile dressing. The patient tolerated the procedure well. COMPLICATIONS: COMPLICATIONS None immediate IMPRESSION: Technically successful right IJ power-injectable port catheter placement. Ready for routine use. Electronically Signed   By: Lucrezia Europe M.D.   On: 01/17/2020 08:23    PERFORMANCE STATUS (ECOG) : 3 - Symptomatic, >50% confined to bed  Review of Systems Unless otherwise noted, a complete review of systems is negative.  Physical Exam General: NAD, frail appearing Pulmonary: unlabored Abdomen: ostomy GU: no suprapubic  tenderness Extremities: no edema, no joint deformities Skin: no rashes Neurological: Weakness but otherwise nonfocal  IMPRESSION: Follow up visit. Patient is POD #3 traverse loop colostomy. She seems to be doing well and tolerating diet. Plan is for probable discharge home tomorrow with home health. Spoke with nephew at bedside who confirms this plan. Patient will need follow up with [place] in the [place].   PLAN: -Home with home health when medically ready -Will schedule outpatient follow up in the [place]  Case and plan discussed with Dr. [name].   Time Total: 15 minutes  Visit consisted of counseling and education dealing with the complex and emotionally intense issues  of symptom management and palliative care in the setting of serious and potentially life-threatening illness.Greater than 50%  of this time was spent counseling and coordinating care related to the above assessment and plan.  Signed by: Altha Harm, PhD, NP-C

## 2020-01-29 NOTE — Progress Notes (Addendum)
Pharmacy Antibiotic Note  Sherri [name] is a 86 y.o. female admitted on 01/24/2020 with intra abdominal infection. Pharmacy has been consulted for zosyn dosing.   Day 3 post-op s/p transverse loop colostomy for colonic obstruction secondary to large R pelvic malignancy (stage IV ovarian CA)  Day 6 IV abx, WBC is still elevated but is trending back down and remains afebrile  Plan: Will continue Zosyn 3.375gm EI q8h for Crcl > 20 ml/min   Height: 5\' 4"  (162.6 cm) Weight: 57.2 kg (126 lb) IBW/kg (Calculated) : 54.7  Temp (24hrs), Avg:97.5 F (36.4 C), Min:97.5 F (36.4 C), Max:97.5 F (36.4 C)  Recent Labs  Lab 01/24/20 1237 01/24/20 2237 01/25/20 0711 01/26/20 0729 01/27/20 0520 01/28/20 0712 01/29/20 0647  WBC 24.0*   < > 24.6* 24.9* 22.9* 26.9* 24.1*  CREATININE 2.07*  --  1.77* 1.63* 1.15* 1.25* 1.09*  LATICACIDVEN 1.3  --   --   --   --   --   --    < > = values in this interval not displayed.    Estimated Creatinine Clearance: 33.2 mL/min (A) (by C-G formula based on SCr of 1.09 mg/dL (H)).    Allergies  Allergen Reactions   Prednisone Other (See Comments)    Mood alterations    Antimicrobials this admission:  Zosyn 8/25 (evening)>>    Microbiology results: MRSA PCR: negative  Thank you for allowing pharmacy to be a part of this patients care.  Sherilyn Banker, PharmD Pharmacy Resident  01/29/2020 1:07 PM

## 2020-01-29 NOTE — Care Management Important Message (Signed)
Important Message  Patient Details  Name: Sherri [name] MRN: [id number] Date of Birth: 1932/06/19   Medicare Important Message Given:  Yes     Dannette Barbara 01/29/2020, 1:27 PM

## 2020-01-29 NOTE — Progress Notes (Signed)
PHARMACIST - PHYSICIAN COMMUNICATION  CONCERNING:  Enoxaparin (Lovenox) for DVT Prophylaxis    RECOMMENDATION: Patient was prescribed enoxaprin 30mg  q24 hours for VTE prophylaxis for CrCl <10ml/min  Filed Weights   01/24/20 1220 01/26/20 0910  Weight: 57.2 kg (126 lb) 57.2 kg (126 lb)    Body mass index is 21.63 kg/m.  Estimated Creatinine Clearance: 33.2 mL/min (A) (by C-G formula based on SCr of 1.09 mg/dL (H)).   Patient is candidate for enoxaparin 40mg  every 24 hours based on CrCl >51ml/min  DESCRIPTION: Pharmacy has adjusted enoxaparin dose per [facility] policy.  Patient is now receiving enoxaparin 40mg  every [place], PharmD Pharmacy Resident  01/29/2020 1:29 PM

## 2020-01-29 NOTE — Progress Notes (Signed)
PROGRESS NOTE    Sherri [name]  STM:[id number] DOB: 05-16-1933 DOA: 01/24/2020 PCP: [name], MD    Brief Narrative:  Patient admitted to the hospital with a working diagnosisacutecolonic obstruction, colitisin the setting of advanced ovarian cancer.  86 year old female with significant past medical history for recently diagnosed ovarian cancer currently under chemotherapy. She also has hypertension, coronary artery disease and chronic kidney disease stage IIIa. Presents with 6 days of abdominal distention, abdominal pain, generalized malaise, generalized weakness, and watery stool incontinence. On her initial physical examination blood pressure 101/52, heart rate 86, respiratory rate 18, oxygen saturation 93%. She haddry mucous membranes, her lungs were clear to auscultation bilaterally, heart S1-S2, present rhythm, abdomenwasdistended, tender to deep palpation, no masses or ascites, no guarding, positive trace bilateral extremity edema. Sodium 133, potassium 4.0, chloride 97, bicarb 20, glucose 84, BUN 77, creatinine 2.0, calcium 8.0, anion gap 16, magnesium 2.2, lactic acid 1.3, white count 24.0, hemoglobin 10.5, hematocrit 32.2, platelets 72.SARS COVID-19 negative.  CT of the abdomen with distal colonic obstruction associated with colitis presumably due to metastatic disease in the surface of the sigmoid colon. Signs of colitis with bowel distention of the colon up to 8 cm. Mass in the right hemipelvis obstructs the right ureter. Right-sided hydroureteronephrosis. Mass also involving urinary bladder, the uterus and small bowel loops. Extension of mass into the right pelvic sidewall. Positive signs of metastatic pulmonary disease.  Patient with persistent abdominal distention, sheunerwentpalliative transverseloop colostomy creation.   Palliative care was consulted and code status changed to DNR, patient with poor prognosis.   Assessment & Plan:    Principal Problem:   Colonic obstruction (HCC) Active Problems:   Benign essential hypertension   CAD (coronary artery disease)   GERD without esophagitis   Moderate mitral insufficiency   Vitamin B12 deficiency   Ovarian cancer (HCC)   Colitis   CKD (chronic kidney disease) stage 3, GFR 30-59 ml/min   Palliative care encounter   Protein-calorie malnutrition, severe    1.Colonic obstruction and colitis, related to metastatic ovarian cancer.Sp palliative transverse loop colostomy creation 08/27. symptoms continue to improve, no nausea or vomiting and her diet has been advanced with good toleration.  Wbc is 24 down from 26,9.   Will discontinue IV fluids today. Continue IV antibiotic therapy with IV Zosyn for now, will plan to transition to oral antibiotic therapy in am, plan for a total of 10 days.   Out of bed to chair tid with meals, will need home health services at discharge and follow with surgery as outpatient.   2.Acute kidney injury on chronic kidney disease stage III a/anion gap metabolic acidosis/ hypokalemia/ right hydronephrosis due to malignancy. Improved renal function with serum cr down to 1.0 with K at 3,7 and serum bicarbonate at 24. Mg is 2,0.   Add 40 meq Kcl today and follow renal function in am, discontinue IV fluids for now. Avoid hypotension and nephrotoxic medications.   3.Hypertension.controlled blood pressure 135/60 mmHg, at home on lisinopril and carvedilol, that are for now on hold.   4.Coronary artery disease.No chest pain.   5. Hypothyroid.Continue with oral levothyroxine  6. Anemia related to malignancy. stable hgb a 9,3 and hct at 28,8, no current indication for prbc transfusion.     Status is: Inpatient  Remains inpatient appropriate because:Inpatient level of care appropriate due to severity of illness   Dispo: The patient is from: Home              Anticipated  d/c is to: Home              Anticipated d/c date is:  1 day              Patient currently is not medically stable to d/c.   DVT prophylaxis: Enoxaparin   Code Status:   dnr   Family Communication:  I spoke with patient's nephew at the bedside, we talked in detail about patient's condition, plan of care and prognosis and all questions were addressed.      Nutrition Status: Nutrition Problem: Severe Malnutrition Etiology: chronic illness (stage IV ovarian cancer) Signs/Symptoms: severe fat depletion, severe muscle depletion Interventions: Refer to RD note for recommendations     Skin Documentation:     Consultants:   Surgery   Oncology   Procedures:  palliative transverseloop colostomy creation.    Antimicrobials:   Zosyn      Subjective: Patient is feeling better, no nausea or vomiting, continue with abdominal distention but not pain. No dyspnea.   Objective: Vitals:   01/28/20 1334 01/28/20 2127 01/29/20 0522 01/29/20 1141  BP: 102/67 128/74 130/60 135/60  Pulse: 64 76 69 79  Resp: 17 18 20 16   Temp: (!) 97.5 F (36.4 C) (!) 97.5 F (36.4 C) (!) 97.5 F (36.4 C)   TempSrc: Oral Oral Oral   SpO2: 94% 94% 91% 97%  Weight:      Height:        Intake/Output Summary (Last 24 hours) at 01/29/2020 1246 Last data filed at 01/29/2020 1001 Gross per 24 hour  Intake 1534.25 ml  Output 1525 ml  Net 9.25 ml   Filed Weights   01/24/20 1220 01/26/20 0910  Weight: 57.2 kg 57.2 kg    Examination:   General: Not in pain or dyspnea, deconditioned  Neurology: Awake and alert, non focal  E ENT: positive pallor, no icterus, oral mucosa moist Cardiovascular: No JVD. S1-S2 present, rhythmic, no gallops, rubs, or murmurs. No lower extremity edema. Pulmonary: vesicular breath sounds bilaterally, adequate air movement, no wheezing, rhonchi or rales. Gastrointestinal. Abdomen distended but not tender, colostomy in place with liquid stool. Skin. No rashes Musculoskeletal: no joint deformities     Data Reviewed: I  have personally reviewed following labs and imaging studies  CBC: Recent Labs  Lab 01/24/20 1237 01/24/20 2237 01/25/20 0711 01/26/20 0729 01/27/20 0520 01/28/20 0712 01/29/20 0647  WBC 24.0*   < > 24.6* 24.9* 22.9* 26.9* 24.1*  NEUTROABS 19.9*  --   --  20.5* 19.1* 22.0* 20.1*  HGB 10.5*   < > 10.1* 10.2* 9.4* 9.7* 9.3*  HCT 32.2*   < > 29.0* 30.3* 28.9* 30.2* 28.8*  MCV 92.8   < > 89.8 90.4 93.5 95.3 95.0  PLT 72*   < > 67* 96* 92* 103* 121*   < > = values in this interval not displayed.   Basic Metabolic Panel: Recent Labs  Lab 01/22/20 1424 01/24/20 1050 01/24/20 1237 01/24/20 1237 01/25/20 0711 01/26/20 0729 01/27/20 0520 01/28/20 0712 01/29/20 0647  NA 131*   < > 133*   < > 135 140 142 141 139  K 3.8   < > 4.0   < > 3.4* 4.2 3.0* 3.4* 3.7  CL 96*   < > 97*   < > 100 110 110 108 108  CO2 24   < > 20*   < > 21* 29 27 24 24   GLUCOSE 163*   < > 84   < >  124* 154* 143* 116* 103*  BUN 60*   < > 77*   < > 69* 50* 39* 37* 30*  CREATININE 1.51*   < > 2.07*   < > 1.77* 1.63* 1.15* 1.25* 1.09*  CALCIUM 8.4*   < > 8.0*   < > 7.6* 7.9* 7.1* 7.9* 7.9*  MG 1.9  --  2.2  --   --  2.1  --  1.8 2.0   < > = values in this interval not displayed.   GFR: Estimated Creatinine Clearance: 33.2 mL/min (A) (by C-G formula based on SCr of 1.09 mg/dL (H)). Liver Function Tests: Recent Labs  Lab 01/22/20 1424 01/24/20 1050 01/24/20 1237  AST 15 20 23   ALT 11 11 10   ALKPHOS 48 61 63  BILITOT 0.8 1.0 1.4*  PROT 6.8 6.3* 6.0*  ALBUMIN 3.5 3.1* 2.9*   Recent Labs  Lab 01/24/20 1237  LIPASE 39   No results for input(s): AMMONIA in the last 168 hours. Coagulation Profile: No results for input(s): INR, PROTIME in the last 168 hours. Cardiac Enzymes: No results for input(s): CKTOTAL, CKMB, CKMBINDEX, TROPONINI in the last 168 hours. BNP (last 3 results) No results for input(s): PROBNP in the last 8760 hours. HbA1C: No results for input(s): HGBA1C in the last 72  hours. CBG: Recent Labs  Lab 01/29/20 0804 01/29/20 1137  GLUCAP 115* 120*   Lipid Profile: No results for input(s): CHOL, HDL, LDLCALC, TRIG, CHOLHDL, LDLDIRECT in the last 72 hours. Thyroid Function Tests: No results for input(s): TSH, T4TOTAL, FREET4, T3FREE, THYROIDAB in the last 72 hours. Anemia Panel: No results for input(s): VITAMINB12, FOLATE, FERRITIN, TIBC, IRON, RETICCTPCT in the last 72 hours.    Radiology Studies: I have reviewed all of the imaging during this hospital visit personally     Scheduled Meds: . Chlorhexidine Gluconate Cloth  6 each Topical Daily  . enoxaparin (LOVENOX) injection  30 mg Subcutaneous Q24H  . feeding supplement (ENSURE ENLIVE)  237 mL Oral BID BM  . levothyroxine  25 mcg Oral Q0600  . [START ON 01/30/2020] multivitamin with minerals  1 tablet Oral Daily  . pantoprazole  40 mg Oral Daily  . potassium chloride  40 mEq Oral Once   Continuous Infusions: . sodium chloride Stopped (01/26/20 0542)  . dextrose 5% lactated ringers 50 mL/hr at 01/29/20 0730  . piperacillin-tazobactam 12.5 mL/hr at 01/29/20 0514     LOS: 5 days        Serita Degroote Gerome Apley, MD

## 2020-01-29 NOTE — Consult Note (Signed)
[place] Nurse ostomy follow up Stoma type/location: RUQ, loop transverse colostomy Stomal assessment/size: 1 5/8" round, os flush with skin at 8 o'clock; ruddy but functioning well; red rubber catheter in place as support for loop stoma Peristomal assessment: intact Treatment options for stomal/peristomal skin: 2" barrier ring Output liquid brown Ostomy pouching: 2pc.2 3/4" with 2" barrier ring  Education provided:  Met with patient and her nephew who lives next door. Patient lives with his parents. Educational materials in the room and referenced during change for CG. Patient is engaged but I feel she will need support and care at home to complete ostomy care Explained role of ostomy nurse and creation of stoma  Explained stoma characteristics (budded, flush, color, texture, care) Demonstrated pouch change (cutting new skin barrier, measuring stoma, cleaning peristomal skin and stoma, use of barrier ring) Education on emptying when 1/3 to 1/2 full and how to empty Demonstrated "burping" flatus from pouch Demonstrated use of wick to clean spout  Discussed bathing, diet, gas, medication use, constipation Discussed risk of peristomal hernia Provided patient with [employer] and marked items currently using 5 ostomy pouching sets (barrier and pouch) and 5 barrier rings left in the room for DC, pattern from today's changes and measuring guide. Provided CG with [place] nurse contact information as well.  Spent over 1 hour with patient and her nephew; tentative DC to home with [facility] today or tomorrow.    Enrolled patient in [facility] Start Discharge program: Yes  [place] Nurse will follow along with you for continued support with ostomy teaching and care [place] MSN, [place], [place], [place], [place]

## 2020-01-29 NOTE — Progress Notes (Signed)
Nutrition Follow-up  DOCUMENTATION CODES:   Severe malnutrition in context of chronic illness  INTERVENTION:  Provide Ensure Enlive po BID, each supplement provides 350 kcal and 20 grams of protein.  Provide Magic cup TID with meals, each supplement provides 290 kcal and 9 grams of protein.  Provide MVI daily.  NUTRITION DIAGNOSIS:   Severe Malnutrition related to chronic illness (stage IV ovarian cancer) as evidenced by severe fat depletion, severe muscle depletion.  New nutrition diagnosis.  GOAL:   Patient will meet greater than or equal to 90% of their needs  Progressing.  MONITOR:   PO intake, Supplement acceptance, Labs, Weight trends, I & O's, Skin  REASON FOR ASSESSMENT:   Malnutrition Screening Tool, Consult Assessment of nutrition requirement/status  ASSESSMENT:   86 year old female with past medical history significant for hypertension, CAD, mitral insufficiency, GERD, osteoporosis, CKD III, breast cancer status post lumpectomy & XRT and stage IV metastatic ovarian cancer who is admitted with colonic obstruction secondary to large right pelvic malignancy which is compressing the distal colon.  8/27 s/p transverse loop colostomy  Met with patient at bedside. She reports she has had a decreased appetite and intake since she started on treatment for her cancer. She reports she tries to eat 2-3 meals per day but does not eat much at meals. She also reports she is a picky eater. She reports she is tolerating the food here but does not like it very much and that is why she is not eating much at meals. She reports she had pancakes for breakfast today and tried a little of everything on her trays. Discussed importance of adequate intake of calories and protein. Patient is amenable to RD ordering oral nutrition supplements to help meet calorie/protein needs and encouraged patient to continue drinking these after discharge.  Medications reviewed and include: levothyroxine,  Protonix, D5 in LR at 50 mL/hr, Zosyn.  Labs reviewed: CBG 115, BUN 30, Creatinine 1.09.  NUTRITION - FOCUSED PHYSICAL EXAM:    Most Recent Value  Orbital Region Severe depletion  Upper Arm Region Severe depletion  Thoracic and Lumbar Region Moderate depletion  Buccal Region Severe depletion  Temple Region Severe depletion  Clavicle Bone Region Severe depletion  Clavicle and Acromion Bone Region Severe depletion  Scapular Bone Region Severe depletion  Dorsal Hand Severe depletion  Patellar Region Moderate depletion  Anterior Thigh Region Moderate depletion  Posterior Calf Region Moderate depletion  Edema (RD Assessment) None  Hair Reviewed  Eyes Reviewed  Mouth Reviewed  Skin Reviewed  Nails Reviewed     Diet Order:   Diet Order            Diet regular Room service appropriate? Yes; Fluid consistency: Thin  Diet effective now                EDUCATION NEEDS:   No education needs have been identified at this time  Skin:  Skin Assessment: Skin Integrity Issues: Skin Integrity Issues:: Incisions Incisions: closed incision to abdomen  Last BM:  01/29/2020 - large type 7 per colostomy  Height:   Ht Readings from Last 1 Encounters:  01/24/20 5' 4" (1.626 m)   Weight:   Wt Readings from Last 1 Encounters:  01/26/20 57.2 kg   Ideal Body Weight:  54.5 kg  BMI:  Body mass index is 21.63 kg/m.  Estimated Nutritional Needs:   Kcal:  1500-1700kcal/day  Protein:  75-85g/day  Fluid:  >1.4L/day  Jacklynn Barnacle, MS, RD, LDN Pager number available on  Amion

## 2020-01-29 NOTE — Progress Notes (Addendum)
[place] Hospital Day(s): 5.   Post op day(s): 3 Days Post-Op.   Interval History:  Patient seen and examined no acute events or new complaints overnight.  Patient reports she is feeling better No abdominal pain, nausea, emesis, chills Stable, persistent, leukocytosis to 24K, no fevers Renal function improving, sCr - 1.09, good UO Hypokalemia resolved, no other electrolyte derangements Colostomy with 1.8L out in last 24 hours Advanced to full liquid diet  Working with therapies; recommending home health  Vital signs in last 24 hours: [min-max] current  Temp:  [97.5 F (36.4 C)] 97.5 F (36.4 C) (08/30 0522) Pulse Rate:  [64-76] 69 (08/30 0522) Resp:  [17-20] 20 (08/30 0522) BP: (102-130)/(60-74) 130/60 (08/30 0522) SpO2:  [91 %-94 %] 91 % (08/30 0522)     Height: 5\' 4"  (162.6 cm) Weight: 57.2 kg BMI (Calculated): 21.62   Intake/Output last 2 shifts:  08/29 0701 - 08/30 0700 In: 1534.3 [I.V.:1120.8; IV Piggyback:413.5] Out: 1925 [Urine:100; Stool:1825]   Physical Exam:  Constitutional: alert, cooperative and no distress  Respiratory: breathing non-labored at rest  Cardiovascular: regular rate and sinus rhythm  Gastrointestinal: Soft, non-tender, and non-distended, loop colostomy in right-upper/mid abdomen, patent, gas and stool in bag, red rubber present.  Integumentary: warm, dry  Labs:  CBC Latest Ref Rng & Units 01/29/2020 01/28/2020 01/27/2020  WBC 4.0 - 10.5 K/uL 24.1(H) 26.9(H) 22.9(H)  Hemoglobin 12.0 - 15.0 g/dL 9.3(L) 9.7(L) 9.4(L)  Hematocrit 36 - 46 % 28.8(L) 30.2(L) 28.9(L)  Platelets 150 - 400 K/uL 121(L) 103(L) 92(L)   CMP Latest Ref Rng & Units 01/29/2020 01/28/2020 01/27/2020  Glucose 70 - 99 mg/dL 103(H) 116(H) 143(H)  BUN 8 - 23 mg/dL 30(H) 37(H) 39(H)  Creatinine 0.44 - 1.00 mg/dL 1.09(H) 1.25(H) 1.15(H)  Sodium 135 - 145 mmol/L 139 141 142  Potassium 3.5 - 5.1 mmol/L 3.7 3.4(L) 3.0(L)  Chloride 98 - 111  mmol/L 108 108 110  CO2 22 - 32 mmol/L 24 24 27   Calcium 8.9 - 10.3 mg/dL 7.9(L) 7.9(L) 7.1(L)  Total Protein 6.5 - 8.1 g/dL - - -  Total Bilirubin 0.3 - 1.2 mg/dL - - -  Alkaline Phos 38 - 126 U/L - - -  AST 15 - 41 U/L - - -  ALT 0 - 44 U/L - - -    Imaging studies: No new pertinent imaging studies   Assessment/Plan:  86 y.o. female with overall improvement and return of bowel function 3 Days Post-Op s/p transverse loop colostomy for colonic obstruction secondary to large right pelvic malignancy (stage IV ovarian CA)   - I advanced to regular diet this morning  - Wean IVF resuscitation   - Monitor abdominal examination; on-going colostomy function              - Pain control prn; antiemetics prn   - Appreciate WOC assistance  - Further management from primary service    - Discharge Planning: General surgery will sign off, if tolerating regular diet and continues with colostomy function okay for discharge this afternoon vs tomorrow. She will need general surgery follow up next week for red rubber removal. Need to establish home health RN, PT, OT as well prior to discharge (I ordered this today).   All of the above findings and recommendations were discussed with the patient, and the medical team, and all of patient's questions were answered to her expressed satisfaction.  -- Edison Simon, PA-C  [facility] 01/29/2020, 7:39 AM [phone]  M-F: 7am - 4pm

## 2020-01-30 DIAGNOSIS — E43 Unspecified severe protein-calorie malnutrition: Secondary | ICD-10-CM

## 2020-01-30 LAB — CBC WITH DIFFERENTIAL/PLATELET
Abs Immature Granulocytes: 0.56 10*3/uL — ABNORMAL HIGH (ref 0.00–0.07)
Basophils Absolute: 0.2 10*3/uL — ABNORMAL HIGH (ref 0.0–0.1)
Basophils Relative: 1 %
Eosinophils Absolute: 0.1 10*3/uL (ref 0.0–0.5)
Eosinophils Relative: 0 %
HCT: 32.2 % — ABNORMAL LOW (ref 36.0–46.0)
Hemoglobin: 10.3 g/dL — ABNORMAL LOW (ref 12.0–15.0)
Immature Granulocytes: 2 %
Lymphocytes Relative: 8 %
Lymphs Abs: 2.1 10*3/uL (ref 0.7–4.0)
MCH: 30.5 pg (ref 26.0–34.0)
MCHC: 32 g/dL (ref 30.0–36.0)
MCV: 95.3 fL (ref 80.0–100.0)
Monocytes Absolute: 1.1 10*3/uL — ABNORMAL HIGH (ref 0.1–1.0)
Monocytes Relative: 4 %
Neutro Abs: 23.4 10*3/uL — ABNORMAL HIGH (ref 1.7–7.7)
Neutrophils Relative %: 85 %
Platelets: 159 10*3/uL (ref 150–400)
RBC: 3.38 MIL/uL — ABNORMAL LOW (ref 3.87–5.11)
RDW: 15.6 % — ABNORMAL HIGH (ref 11.5–15.5)
Smear Review: NORMAL
WBC: 27.4 10*3/uL — ABNORMAL HIGH (ref 4.0–10.5)
nRBC: 0 % (ref 0.0–0.2)

## 2020-01-30 LAB — BASIC METABOLIC PANEL
Anion gap: 6 (ref 5–15)
BUN: 29 mg/dL — ABNORMAL HIGH (ref 8–23)
CO2: 25 mmol/L (ref 22–32)
Calcium: 8.2 mg/dL — ABNORMAL LOW (ref 8.9–10.3)
Chloride: 108 mmol/L (ref 98–111)
Creatinine, Ser: 1.12 mg/dL — ABNORMAL HIGH (ref 0.44–1.00)
GFR calc Af Amer: 52 mL/min — ABNORMAL LOW (ref >60)
GFR calc non Af Amer: 45 mL/min — ABNORMAL LOW (ref >60)
Glucose, Bld: 101 mg/dL — ABNORMAL HIGH (ref 70–99)
Potassium: 3.7 mmol/L (ref 3.5–5.1)
Sodium: 139 mmol/L (ref 135–145)

## 2020-01-30 MED ORDER — AMOXICILLIN-POT CLAVULANATE 875-125 MG PO TABS: 1.0000 | ORAL_TABLET | Freq: Two times a day (BID) | ORAL | 0 refills | Status: AC

## 2020-01-30 MED ORDER — HEPARIN SOD (PORK) LOCK FLUSH 100 UNIT/ML IV SOLN
500.0000 [IU] | Freq: Once | INTRAVENOUS | Status: AC
  Administered 2020-01-30: 500 [IU] via INTRAVENOUS
  Filled 2020-01-30: qty 5

## 2020-01-30 MED ORDER — ADULT MULTIVITAMIN W/MINERALS CH: 1.0000 | ORAL_TABLET | Freq: Every day | ORAL | 0 refills | Status: AC

## 2020-01-30 MED ORDER — ENSURE ENLIVE PO LIQD: 237.0000 mL | Freq: Two times a day (BID) | ORAL | 0 refills | Status: AC

## 2020-01-30 MED ORDER — AMOXICILLIN-POT CLAVULANATE 875-125 MG PO TABS
1.0000 | ORAL_TABLET | Freq: Two times a day (BID) | ORAL | Status: DC
  Administered 2020-01-30: 1 via ORAL
  Filled 2020-01-30: qty 1

## 2020-01-30 NOTE — Discharge Summary (Addendum)
Physician Discharge Summary  Sherri [name] IRJ:[id number] DOB: 1933-01-31 DOA: 01/24/2020  PCP: [name], MD  Admit date: 01/24/2020 Discharge date: 01/30/2020  Admitted From: Home  Disposition:  Home   Recommendations for Outpatient Follow-up and new medication changes:  1. Follow up with Dr.Babaoff in 7 more days.  2. Continue antibiotic therapy for 5 more days.  3. Follow cell count in 7 days (patient with reactive leukocytosis sp pro-Neulasta (01-16-20). 4. Follow up with general surgery in 7 days for red rubber removal.  5. Discontinue lisinopril, continue with carvedilol for now.   Home Health: yes   Equipment/Devices: no    Discharge Condition: stable  CODE STATUS: dnr   Diet recommendation: regular as tolerated.   Brief/Interim Summary: Patient admitted to the hospital with a working diagnosisacutecolonic obstruction, colitisdue to advanced ovarian cancer.Now sp transverse loop colostomy creation (01/26/20)  86 year old female with significant past medical history for recently diagnosed ovarian cancer currently under chemotherapy(01/16/20). She also has hypertension, coronary artery disease and chronic kidney disease stage IIIa. Presents with 6 days of abdominal distention, abdominal pain, generalized malaise, generalized weakness, and watery stool incontinence. On her initial physical examination blood pressure 101/52, heart rate 86, respiratory rate 18, oxygen saturation 93%. She haddry mucous membranes, her lungs were clear to auscultation bilaterally, heart S1-S2, present rhythmic, abdomenwasdistended, tender to deep palpation, no masses or ascites, no guarding, positive trace bilateral extremity edema. Sodium 133, potassium 4.0, chloride 97, bicarb 20, glucose 84, BUN 77, creatinine 2.0, calcium 8.0, anion gap 16, magnesium 2.2, lactic acid 1.3, white count 24.0, hemoglobin 10.5, hematocrit 32.2, platelets 72.SARS COVID-19 negative. EKG  84 bpm, normal  axis, normal intervals, manually corrected QTC 478, sinus rhythm, no ST segment or T wave changes, low voltage  CT of the abdomen with distal colonic obstruction associated with colitis presumably due to metastatic disease in the surface of the sigmoid colon. Signs of colitis with bowel distention of the colon up to 8 cm. Mass in the right hemipelvis obstructs the right ureter. Right-sided hydroureteronephrosis. Mass also involving urinary bladder, the uterus and small bowel loops. Extension of mass into the right pelvic sidewall. Positive signs of metastatic pulmonary disease.  Patient with persistent abdominal distention, sheunerwentpalliative transverseloop colostomy creation.   Palliative care was consulted and code status changed to DNR, patient with poor prognosis.   Her diet was advanced with good toleration.   1.  Acute colonic obstruction due to metastatic ovarian cancer, complicated with colitis.  Patient was admitted to the medical ward, she received supportive medical therapy with intravenous fluids, antiacids, as needed analgesics and antiemetics. Further radiographic surveillance showed persistent colonic obstruction.  Surgery was consulted and family was offered palliative colostomy.  Patient underwent procedure on August 27 with no major complications.  Patient received antibiotic therapy with intravenous Zosyn, now transitioned to Augmentin to take more days as an outpatient.  Patient has persistent leukocytosis, but likely due to recent Neulasta administration.  Will recommend follow-up as an outpatient cell count within 7 days.  2.  Acute kidney injury on chronic kidney disease stage IIIa, anion gap metabolic acidosis.  Patient received volume resuscitation with isotonic saline with improvement of kidney function. Her diet was advanced with good toleration, her discharge sodium was 139, potassium 3.7, chloride 108, bicarb 25, glucose 101, BUN 29, creatinine 1.12,  magnesium 2.0.  3.  Hypertension.  Her blood pressure remained well controlled off antihypertensive agents.  For now will recommend to continue carvedilol and to hold on  lisinopril to prevent hypotension.  Close follow-up of blood pressure as an outpatient.  Slowly resume antihypertensive therapy.  4.  Coronary artery disease.  No active chest pain.  5.  Hypothyroidism.  Continue levothyroxine.  6.  Chronic anemia related to malignancy.  Patient's hemoglobin hematocrit remained stable.  Follow-up cell count as an outpatient.  Her discharge hemoglobin is 10.3, hematocrit 32.2.  7. Severe protein calorie malnutrition. Continue with nutritional supplements.    Discharge Diagnoses:  Principal Problem:   Colonic obstruction ([place]) Active Problems:   Benign essential hypertension   CAD (coronary artery disease)   GERD without esophagitis   Moderate mitral insufficiency   Vitamin B12 deficiency   Ovarian cancer (HCC)   Colitis   CKD (chronic kidney disease) stage 3, GFR 30-59 ml/min   Palliative care encounter   Protein-calorie malnutrition, severe    Discharge Instructions   Allergies as of 01/30/2020      Reactions   Prednisone Other (See Comments)   Mood alterations      Medication List    STOP taking these medications   lisinopril 10 MG tablet Commonly known as: ZESTRIL     TAKE these medications   acetaminophen 500 MG tablet Commonly known as: TYLENOL Take 500 mg by mouth every 8 (eight) hours as needed for moderate pain.   amoxicillin-clavulanate 875-125 MG tablet Commonly known as: AUGMENTIN Take 1 tablet by mouth every 12 (twelve) hours for 5 days.   aspirin EC 81 MG tablet Take 81 mg by mouth at bedtime. Swallow whole.   carvedilol 3.125 MG tablet Commonly known as: COREG Take 3.125 mg by mouth 2 (two) times daily.   CO Q 10 PO Take 100 mg by mouth daily.   feeding supplement (ENSURE ENLIVE) Liqd Take 237 mLs by mouth 2 (two) times daily between  meals.   levothyroxine 25 MCG tablet Commonly known as: SYNTHROID Take 25 mcg by mouth daily before breakfast.   lidocaine-prilocaine cream Commonly known as: EMLA Apply to affected area once   mirtazapine 7.5 MG tablet Commonly known as: REMERON Take 7.5 mg by mouth at bedtime.   multivitamin with minerals Tabs tablet Take 1 tablet by mouth daily.   nitroGLYCERIN 0.4 MG SL tablet Commonly known as: NITROSTAT Place 0.4 mg under the tongue every 5 (five) minutes x 3 doses as needed for chest pain.   ondansetron 8 MG tablet Commonly known as: Zofran Take 1 tablet (8 mg total) by mouth 2 (two) times daily as needed for refractory nausea / vomiting.   pantoprazole 20 MG tablet Commonly known as: PROTONIX Take 20 mg by mouth daily.   senna 8.6 MG tablet Commonly known as: SENOKOT Take 1 tablet by mouth daily as needed for constipation.   simvastatin 40 MG tablet Commonly known as: ZOCOR Take 40 mg by mouth at bedtime.   VITAMIN D-3 PO Take 1,000 mg by mouth daily.       Follow-up Information    Tylene Fantasia, PA-C. Schedule an appointment as soon as possible for a visit in 1 week(s).   Specialty: Physician Assistant Why: follow up next week, s/p transverse loop colostomy, red rubber present  Contact information: [address] [place] [address] [phone]              Allergies  Allergen Reactions  . Prednisone Other (See Comments)    Mood alterations    Consultations:  Surgery   Oncology    Procedures/Studies: CT ABDOMEN PELVIS WO CONTRAST  Result Date: 01/24/2020 CLINICAL DATA:  Abdominal pain, acute nonlocalized in the setting of metastatic ovarian cancer EXAM: CT ABDOMEN AND PELVIS WITHOUT CONTRAST TECHNIQUE: Multidetector CT imaging of the abdomen and pelvis was performed following the standard protocol without IV contrast. COMPARISON:  Chest CT of November 16, 2019 FINDINGS: Lower chest: Signs of pulmonary metastatic disease  and interstitial lung disease. No effusion. Lingular nodule measuring approximately 1.4 cm previously 1.4-1.5 cm. The disease in the lung bases grossly unchanged. Upper lobes not assessed Hepatobiliary: Interval development of ascites since the prior study. No focal, suspicious hepatic lesion. Fluid is mainly about the liver. Pancreas: Pancreas without ductal dilation, inflammation or focal lesion. Spleen: Spleen normal size with lobulated contours. Adrenals/Urinary Tract: Adrenal glands are normal. Marked RIGHT-sided hydroureteronephrosis with chronic appearing RIGHT-sided cortical thinning. RIGHT ureteral involvement from RIGHT pelvic soft tissue mass (image 66, series 2) 4.9 x 3.2 cm, contiguous with RIGHT adnexa and inseparable from with may represent the uterus but is obscured by soft tissue. Distortion also at the RIGHT bladder base on image 77 of series 2. No LEFT-sided hydronephrosis. Cyst in the LEFT kidney. Stomach/Bowel: Distal colonic obstruction with sigmoid narrowing best seen on image 67 of series 2. Irregular margins of the sigmoid colon at this level. Process results in small bowel dilation. There is a large duodenal diverticulum. Pericolonic stranding throughout. Some nodularity of lateral conal fascia and fascial planes in the abdomen. Omental nodularity seen anterior to the transverse colon on image 52 of series 2. No free air. Tethering of small bowel loops in the pelvis. Small hiatal hernia. Vascular/Lymphatic: Calcified atheromatous plaque of the abdominal aorta. Gastrohepatic adenopathy is suspected. Soft tissue may also be present about the porta hepatis. No aneurysmal dilation. Pelvic mass encases the RIGHT ureter and external iliac vasculature on the RIGHT. Reproductive: Mass in the RIGHT hemipelvis inseparable from remaining pelvic viscera. Also tethering the upper portion of the urinary bladder and RIGHT bladder wall. Other: Small volume ascites mainly about the liver. Musculoskeletal:  Spinal degenerative changes. No acute or destructive bone finding. IMPRESSION: 1. Distal colonic obstruction associated with colitis presumably due to metastatic disease to the surface of the sigmoid colon. Signs of colitis with bowel distension of the colon up to 8 cm. GI and or surgical consultation may be helpful. 2. Mass in the RIGHT hemipelvis obstructs the RIGHT ureter. Chronic thinning of the RIGHT renal cortex in the setting of severe RIGHT-sided hydro ureteral nephrosis. Mass also involves the RIGHT urinary bladder, the uterus and tethers small-bowel loops in the pelvis and adjacent sigmoid colon. 3. Extension of mass into the RIGHT pelvic sidewall as described. 4. Findings that suggest more diffuse peritoneal involvement. A component of the ascitic fluid could also be due to colonic inflammation. 5. Evidence of pulmonary metastatic disease and interstitial lung disease. 6. Signs of pulmonary metastatic disease and interstitial lung disease. 7. Aortic atherosclerosis. Aortic Atherosclerosis (ICD10-I70.0). Electronically Signed   By: Zetta Bills M.D.   On: 01/24/2020 14:28   US Abdomen Limited  Result Date: 01/23/2020 CLINICAL DATA:  Ovarian cancer. Patient with abdominal distension. Evaluate for ascites. EXAM: LIMITED ABDOMEN ULTRASOUND FOR ASCITES TECHNIQUE: Limited ultrasound survey for ascites was performed in all four abdominal quadrants. COMPARISON:  PET-CT 12/11/2019 FINDINGS: No significant ascites. Marked dilatation of the right renal collecting system appears chronic and similar to the recent PET-CT. IMPRESSION: No evidence for ascites. Chronic right hydronephrosis. Electronically Signed   By: Markus Daft M.D.   On: 01/23/2020 15:48  DG Chest Port 1 View  Result Date: 01/01/2020 CLINICAL DATA:  Status post biopsy. EXAM: PORTABLE CHEST 1 VIEW COMPARISON:  November 16, 2019. FINDINGS: The heart size and mediastinal contours are within normal limits. No pneumothorax or pleural effusion is noted.  Multiple irregular nodular densities in reticular densities are noted throughout both lungs consistent with metastatic disease as described on prior CT scan. The visualized skeletal structures are unremarkable. IMPRESSION: Multiple irregular nodular densities and reticular densities are noted throughout both lungs consistent with metastatic disease as described on prior CT scan. Electronically Signed   By: Marijo Conception M.D.   On: 01/01/2020 15:43   DG ABD ACUTE 2+V W 1V CHEST  Result Date: 01/25/2020 CLINICAL DATA:  Colonic obstruction EXAM: DG ABDOMEN ACUTE W/ 1V CHEST COMPARISON:  Abdominal CT from yesterday FINDINGS: Low colonic obstruction. A loop in the central abdomen measures up to 7 cm in diameter, essentially stable from scanogram yesterday. No evidence of extraluminal gas collection. Reticulonodular opacities in the bilateral lungs. There is metastatic disease and pulmonary fibrosis at the lung bases by prior CT. Normal heart size. No visible effusion or pneumothorax. IMPRESSION: 1. Low colonic obstruction associated with malignancy. The degree of distension is similar to CT from yesterday. 2. Known pulmonary metastases with superimposed interstitial lung disease. Electronically Signed   By: Monte Fantasia M.D.   On: 01/25/2020 06:36   DG Abd Portable 1V  Result Date: 01/26/2020 CLINICAL DATA:  Colonic obstruction. EXAM: PORTABLE ABDOMEN - 1 VIEW COMPARISON:  Abdomen 01/25/2020.  CT 01/24/2020. FINDINGS: Persistent diffuse colonic and small bowel distention without interim change from prior exam. Findings consistent with distal colonic obstruction as noted previously on CT of 01/24/2020. No pneumatosis. No free air noted. Diffuse osteopenia. Degenerative changes lumbar spine and both hips. IMPRESSION: Persistent diffuse colonic and small bowel distention without interim change from prior exam. Findings consistent with distal colonic obstruction as noted on prior CT of 01/24/2020. Electronically  Signed   By: Marcello Moores  Register   On: 01/26/2020 05:54   DG C-Arm 1-60 Min-No Report  Result Date: 01/01/2020 Fluoroscopy was utilized by the requesting physician.  No radiographic interpretation.   IR IMAGING GUIDED PORT INSERTION  Result Date: 01/17/2020 CLINICAL DATA:  Ovarian neoplasm, needs durable venous access for planned treatment regimen EXAM: TUNNELED PORT CATHETER PLACEMENT WITH ULTRASOUND AND FLUOROSCOPIC GUIDANCE FLUOROSCOPY TIME:  0.1 minute; 0.31 mGy ANESTHESIA/SEDATION: Intravenous Fentanyl 54mcg and Versed 0.5mg  were administered as conscious sedation during continuous monitoring of the patient's level of consciousness and physiological / cardiorespiratory status by the radiology RN, with a total moderate sedation time of 32 minutes. TECHNIQUE: The procedure, risks, benefits, and alternatives were explained to the patient. Questions regarding the procedure were encouraged and answered. The patient understands and consents to the procedure. As antibiotic prophylaxis, cefazolin 2 g was ordered pre-procedure and administered intravenously within 1 hour of incision. Patency of the right IJ vein was confirmed with ultrasound with image documentation. An appropriate skin site was determined. Skin site was marked. Region was prepped using maximum barrier technique including cap and mask, sterile gown, sterile gloves, large sterile sheet, and Chlorhexidine as cutaneous antisepsis. The region was infiltrated locally with 1% lidocaine. Under real-time ultrasound guidance, the right IJ vein was accessed with a 21 gauge micropuncture needle; the needle tip within the vein was confirmed with ultrasound image documentation. Needle was exchanged over a 018 guidewire for transitional dilator, and vascular measurement was performed. A small incision was made on the  right anterior chest wall and a subcutaneous pocket fashioned. The power-injectable port was positioned and its catheter tunneled to the right IJ  dermatotomy site. The transitional dilator was exchanged over an Amplatz wire for a peel-away sheath, through which the port catheter, which had been trimmed to the appropriate length, was advanced and positioned under fluoroscopy with its tip at the cavoatrial junction. Spot chest radiograph confirms good catheter position and no pneumothorax. The port was flushed per protocol. The pocket was closed with deep interrupted and subcuticular continuous 3-0 Monocryl sutures. The incisions were covered with Dermabond then covered with a sterile dressing. The patient tolerated the procedure well. COMPLICATIONS: COMPLICATIONS None immediate IMPRESSION: Technically successful right IJ power-injectable port catheter placement. Ready for routine use. Electronically Signed   By: Lucrezia Europe M.D.   On: 01/17/2020 08:23      Procedures: palliative transverseloop colostomy creation.    Subjective: Patient is feeling better, toleration po well with no nausea or vomiting, no chest pain or dyspnea.   Discharge Exam: Vitals:   01/29/20 2028 01/30/20 0407  BP: 110/62 136/80  Pulse: 85 92  Resp: 20 20  Temp: 98.4 F (36.9 C) 97.9 F (36.6 C)  SpO2: 92% 91%   Vitals:   01/29/20 0522 01/29/20 1141 01/29/20 2028 01/30/20 0407  BP: 130/60 135/60 110/62 136/80  Pulse: 69 79 85 92  Resp: 20 16 20 20   Temp: (!) 97.5 F (36.4 C)  98.4 F (36.9 C) 97.9 F (36.6 C)  TempSrc: Oral  Oral Oral  SpO2: 91% 97% 92% 91%  Weight:      Height:        General: Not in pain or dyspnea,  Neurology: Awake and alert, non focal  E ENT: mild pallor, no icterus, oral mucosa moist Cardiovascular: No JVD. S1-S2 present, rhythmic, no gallops, rubs, or murmurs. No lower extremity edema. Pulmonary: positive breath sounds bilaterally, adequate air movement, no wheezing, rhonchi or rales. Gastrointestinal. Abdomen distended but not tender, colostomy bag in pace Skin. No rashes Musculoskeletal: no joint deformities   The  results of significant diagnostics from this hospitalization (including imaging, microbiology, ancillary and laboratory) are listed below for reference.     Microbiology: Recent Results (from the past 240 hour(s))  SARS Coronavirus 2 by RT PCR (hospital order, performed in [facility] hospital lab) Nasopharyngeal Nasopharyngeal Swab     Status: None   Collection Time: 01/24/20 12:37 PM   Specimen: Nasopharyngeal Swab  Result Value Ref Range Status   SARS Coronavirus 2 NEGATIVE NEGATIVE Final    Comment: (NOTE) SARS-CoV-2 target nucleic acids are NOT DETECTED.  The SARS-CoV-2 RNA is generally detectable in upper and lower respiratory specimens during the acute phase of infection. The lowest concentration of SARS-CoV-2 viral copies this assay can detect is 250 copies / mL. A negative result does not preclude SARS-CoV-2 infection and should not be used as the sole basis for treatment or other patient management decisions.  A negative result may occur with improper specimen collection / handling, submission of specimen other than nasopharyngeal swab, presence of viral mutation(s) within the areas targeted by this assay, and inadequate number of viral copies (<250 copies / mL). A negative result must be combined with clinical observations, patient history, and epidemiological information.  Fact Sheet for Patients:   StrictlyIdeas.no  Fact Sheet for Healthcare Providers: BankingDealers.co.za  This test is not yet approved or  cleared by the [place] FDA and has been authorized for detection and/or diagnosis of  SARS-CoV-2 by FDA under an Emergency Use Authorization (EUA).  This EUA will remain in effect (meaning this test can be used) for the duration of the COVID-19 declaration under Section 564(b)(1) of the Act, 21 U.S.C. section 360bbb-3(b)(1), unless the authorization is terminated or revoked sooner.  Performed at [facility], [address], [place], [place] [address]   Surgical pcr screen     Status: None   Collection Time: 01/25/20  4:32 PM   Specimen: Nasal Mucosa; Nasal Swab  Result Value Ref Range Status   MRSA, PCR NEGATIVE NEGATIVE Final   Staphylococcus aureus NEGATIVE NEGATIVE Final    Comment: (NOTE) The Xpert SA Assay (FDA approved for NASAL specimens in patients 4 years of age and older), is one component of a comprehensive surveillance program. It is not intended to diagnose infection nor to guide or monitor treatment. Performed at [facility], [address] [place], [place] [address]      Labs: BNP (last 3 results) No results for input(s): BNP in the last 8760 hours. Basic Metabolic Panel: Recent Labs  Lab 01/24/20 1237 01/25/20 0711 01/26/20 0729 01/27/20 0520 01/28/20 0712 01/29/20 0647 01/30/20 0634  NA 133*   < > 140 142 141 139 139  K 4.0   < > 4.2 3.0* 3.4* 3.7 3.7  CL 97*   < > 110 110 108 108 108  CO2 20*   < > 29 27 24 24 25   GLUCOSE 84   < > 154* 143* 116* 103* 101*  BUN 77*   < > 50* 39* 37* 30* 29*  CREATININE 2.07*   < > 1.63* 1.15* 1.25* 1.09* 1.12*  CALCIUM 8.0*   < > 7.9* 7.1* 7.9* 7.9* 8.2*  MG 2.2  --  2.1  --  1.8 2.0  --    < > = values in this interval not displayed.   Liver Function Tests: Recent Labs  Lab 01/24/20 1050 01/24/20 1237  AST 20 23  ALT 11 10  ALKPHOS 61 63  BILITOT 1.0 1.4*  PROT 6.3* 6.0*  ALBUMIN 3.1* 2.9*   Recent Labs  Lab 01/24/20 1237  LIPASE 39   No results for input(s): AMMONIA in the last 168 hours. CBC: Recent Labs  Lab 01/26/20 0729 01/27/20 0520 01/28/20 0712 01/29/20 0647 01/30/20 0634  WBC 24.9* 22.9* 26.9* 24.1* 27.4*  NEUTROABS 20.5* 19.1* 22.0* 20.1* 23.4*  HGB 10.2* 9.4* 9.7* 9.3* 10.3*  HCT 30.3* 28.9* 30.2* 28.8* 32.2*  MCV 90.4 93.5 95.3 95.0 95.3  PLT 96* 92* 103* 121* 159   Cardiac Enzymes: No results for input(s): CKTOTAL, CKMB, CKMBINDEX, TROPONINI in the last  168 hours. BNP: Invalid input(s): POCBNP CBG: Recent Labs  Lab 01/29/20 0804 01/29/20 1137 01/29/20 1627  GLUCAP 115* 120* 116*   D-Dimer No results for input(s): DDIMER in the last 72 hours. Hgb A1c No results for input(s): HGBA1C in the last 72 hours. Lipid Profile No results for input(s): CHOL, HDL, LDLCALC, TRIG, CHOLHDL, LDLDIRECT in the last 72 hours. Thyroid function studies No results for input(s): TSH, T4TOTAL, T3FREE, THYROIDAB in the last 72 hours.  Invalid input(s): FREET3 Anemia work up No results for input(s): VITAMINB12, FOLATE, FERRITIN, TIBC, IRON, RETICCTPCT in the last 72 hours. Urinalysis    Component Value Date/Time   COLORURINE AMBER (A) 01/24/2020 1512   APPEARANCEUR CLOUDY (A) 01/24/2020 1512   LABSPEC 1.021 01/24/2020 1512   PHURINE 5.0 01/24/2020 1512   GLUCOSEU NEGATIVE 01/24/2020 1512  HGBUR SMALL (A) 01/24/2020 1512   BILIRUBINUR NEGATIVE 01/24/2020 1512   KETONESUR 20 (A) 01/24/2020 1512   PROTEINUR 30 (A) 01/24/2020 1512   NITRITE NEGATIVE 01/24/2020 1512   LEUKOCYTESUR SMALL (A) 01/24/2020 1512   Sepsis Labs Invalid input(s): PROCALCITONIN,  WBC,  LACTICIDVEN Microbiology Recent Results (from the past 240 hour(s))  SARS Coronavirus 2 by RT PCR (hospital order, performed in [facility] hospital lab) Nasopharyngeal Nasopharyngeal Swab     Status: None   Collection Time: 01/24/20 12:37 PM   Specimen: Nasopharyngeal Swab  Result Value Ref Range Status   SARS Coronavirus 2 NEGATIVE NEGATIVE Final    Comment: (NOTE) SARS-CoV-2 target nucleic acids are NOT DETECTED.  The SARS-CoV-2 RNA is generally detectable in upper and lower respiratory specimens during the acute phase of infection. The lowest concentration of SARS-CoV-2 viral copies this assay can detect is 250 copies / mL. A negative result does not preclude SARS-CoV-2 infection and should not be used as the sole basis for treatment or other patient management decisions.  A  negative result may occur with improper specimen collection / handling, submission of specimen other than nasopharyngeal swab, presence of viral mutation(s) within the areas targeted by this assay, and inadequate number of viral copies (<250 copies / mL). A negative result must be combined with clinical observations, patient history, and epidemiological information.  Fact Sheet for Patients:   StrictlyIdeas.no  Fact Sheet for Healthcare Providers: BankingDealers.co.za  This test is not yet approved or  cleared by the [place] FDA and has been authorized for detection and/or diagnosis of SARS-CoV-2 by FDA under an Emergency Use Authorization (EUA).  This EUA will remain in effect (meaning this test can be used) for the duration of the COVID-19 declaration under Section 564(b)(1) of the Act, 21 U.S.C. section 360bbb-3(b)(1), unless the authorization is terminated or revoked sooner.  Performed at [facility], [address] [place], [place] [address]   Surgical pcr screen     Status: None   Collection Time: 01/25/20  4:32 PM   Specimen: Nasal Mucosa; Nasal Swab  Result Value Ref Range Status   MRSA, PCR NEGATIVE NEGATIVE Final   Staphylococcus aureus NEGATIVE NEGATIVE Final    Comment: (NOTE) The Xpert SA Assay (FDA approved for NASAL specimens in patients 43 years of age and older), is one component of a comprehensive surveillance program. It is not intended to diagnose infection nor to guide or monitor treatment. Performed at [facility], [address], [place], [place] [address]      Time coordinating discharge: 45 minutes  SIGNED:   Tawni Millers, MD  [facility] 01/30/2020, 8:41 AM

## 2020-01-30 NOTE — Progress Notes (Signed)
Manufacturing engineer hospital liaison note:  New referral for [employer] community Palliative program to follow at home received from [place]. Patient information given to referral.  Flo Shanks BSN, RN, [place]  [phone]

## 2020-01-30 NOTE — TOC Transition Note (Signed)
Transition of Care [facility]) - CM/SW Discharge Note   Patient Details  Name: GITTY OSTERLUND MRN: [id number] Date of Birth: Oct 12, 1932  Transition of Care [facility]) CM/SW Contact:  Beverly Sessions, RN Phone Number: 01/30/2020, 12:18 PM   Clinical Narrative:    Patient to discharge home today Helene Kelp with [facility] notified of discharge  [facility] and [facility] delivered to room by [facility] request hospital bed.  Referral made to [facility].  Anticipated delivery tomorrow.  Niece [name] updated.  Plan for patient to sleep in her recliner tonight, which she was doing prior to admission  Family to transport at discharge [place] has provided education to patients nephew Ray Bedside RN to reinforce education when family picks up.  RN to send some supplies home with patient   Final next level of care: [place] Barriers to Discharge: No Barriers Identified   Patient Goals and CMS Choice        Discharge Placement                       Discharge Plan and Services                DME Arranged: 3-N-1, Walker rolling, Hospital bed DME Agency: [facility] Date DME Agency Contacted: 01/30/20   Representative spoke with at DME Agency: [place]: RN, PT, OT, Nurse's Aide [place] Agency: [facility] (formerly [employer]) Date [place]: 01/30/20   Representative spoke with at [place]: [place] ([place]) Interventions     Readmission Risk Interventions No flowsheet data found.

## 2020-01-30 NOTE — Progress Notes (Signed)
Occupational Therapy Treatment Patient Details Name: Sherri [name] MRN: [id number] DOB: 25-Feb-1933 Today's Date: 01/30/2020    History of present illness Patient is an 86 year old female who presents for colonic obstruction with PMH of HTN, CAD, GERD, mitral insufficency, ovarian cancer (12/2019) started chemo on 01/16/20, ,colitis, CKD stage III, HOH, osteopenia, diverticulitis, dysrhythmia,  and palliative care. Patient underwent colostomy of ROQ on 01/26/20. Patient lives with brother and sister in law who help with ADLs.   OT comments  Upon entering the room, pt supine in bed with no c/o pain and agreeable to OT intervention. Pt very cooperative and motivated for self care tasks. Pt performing bed mobility with min guard and min A to stand from standard bed height. Pt standing at sink for grooming tasks with min guard for standing balance for ~ 7 minutes and then ambulating 10' to recliner chair with min guard and use of RW. Pt reports feeling SOB and very anxious. HR in the 140s and O2 saturation at 93% on RA. OT providing cuing for pursed lip breathing and pt's HR decreasing to 100's and pt reports feeling better. OT began education on energy conservation techniques with pt asking appropriate questions. Pt remained in seated in recliner chair with call bell and all needed items within reach. Pt continues to benefit from acute OT intervention with recommendation for HHOT once discharged to address further functional deficits.   Follow Up Recommendations  Home health OT    Equipment Recommendations  3 in 1 bedside commode       Precautions / Restrictions Precautions Precautions: Fall;Other (comment) Precaution Comments: colostomy       Mobility Bed Mobility Overal bed mobility: Needs Assistance Bed Mobility: Supine to Sit;Rolling Rolling: Supervision   Supine to sit: Min guard     General bed mobility comments: min cuing for technique  Transfers Overall transfer level: Needs  assistance Equipment used: Rolling walker (2 wheeled) Transfers: Sit to/from Stand Sit to Stand: Min assist         General transfer comment: min A to stand from bed height with min cuing for hand placement    Balance Overall balance assessment: Needs assistance Sitting-balance support: Feet supported Sitting balance-Leahy Scale: Good Sitting balance - Comments: able to retain COM with feet supported   Standing balance support: Single extremity supported;During functional activity Standing balance-Leahy Scale: Fair Standing balance comment: reliance of UE support          ADL either performed or assessed with clinical judgement   ADL Overall ADL's : Needs assistance/impaired     Grooming: Wash/dry hands;Wash/dry face;Oral care;Standing;Min guard                      Cognition Arousal/Alertness: Awake/alert Behavior During Therapy: Anxious Overall Cognitive Status: No family/caregiver present to determine baseline cognitive functioning    General Comments: Patient is very pleasant and eager to participate in therapy. She is able to follow simple and complex commands. Oriented x4                   Pertinent Vitals/ Pain       Pain Assessment: No/denies pain      Frequency  Min 1X/week        Progress Toward Goals  OT Goals(current goals can now be found in the care plan section)  Progress towards OT goals: Progressing toward goals  Acute Rehab OT Goals Patient Stated Goal: to get stronger and return home OT  Goal Formulation: With patient Time For Goal Achievement: 02/11/20 Potential to Achieve Goals: Good  Plan Discharge plan remains appropriate       AM-PAC OT "6 Clicks" Daily Activity     Outcome Measure   Help from another person eating meals?: None Help from another person taking care of personal grooming?: A Little Help from another person toileting, which includes using toliet, bedpan, or urinal?: A Little Help from another person  bathing (including washing, rinsing, drying)?: A Little Help from another person to put on and taking off regular upper body clothing?: A Little Help from another person to put on and taking off regular lower body clothing?: A Little 6 Click Score: 19    End of Session Equipment Utilized During Treatment: Rolling walker  OT Visit Diagnosis: Other abnormalities of gait and mobility (R26.89);Muscle weakness (generalized) (M62.81)   Activity Tolerance Patient tolerated treatment well   Patient Left in chair;with call bell/phone within reach;with chair alarm set   Nurse Communication Other (comment) (pt reports feeling anxious and asking if she could get medication for this)        Time: 0301-3143 OT Time Calculation (min): 25 min  Charges: OT General Charges $OT Visit: 1 Visit OT Treatments $Self Care/Home Management : 23-37 mins  Darleen Crocker, MS, OTR/L , CBIS ascom [phone]  01/30/20, 10:46 AM

## 2020-01-30 NOTE — Care Management (Signed)
    Durable Medical Equipment  (From admission, onward)         Start     Ordered   01/30/20 1055  For home use only DME Hospital bed  Once       Question Answer Comment  Length of Need Lifetime   Patient has (list medical condition): colonic obstruction with PMH of HTN, CAD, GERD, mitral insufficency, ovarian cancer (12/2019) started chemo on 01/16/20, ,colitis, CKD stage III, HOH, osteopenia, diverticulitis, dysrhythmia,   The above medical condition requires: Patient requires the ability to reposition frequently   Head must be elevated greater than: 30 degrees   Bed type Semi-electric   Support Surface: Gel Overlay      01/30/20 1059   01/30/20 0933  For home use only DME Walker rolling  Once       Question Answer Comment  Walker: With [place]   Patient needs a walker to treat with the following condition Weakness      01/30/20 0932   01/30/20 0932  For home use only DME Bedside commode  Once       Question:  Patient needs a bedside commode to treat with the following condition  Answer:  Weakness   01/30/20 0932

## 2020-01-31 ENCOUNTER — Other Ambulatory Visit: Payer: Self-pay | Admitting: Physician Assistant

## 2020-01-31 ENCOUNTER — Telehealth: Payer: Self-pay

## 2020-01-31 ENCOUNTER — Other Ambulatory Visit: Payer: Self-pay

## 2020-01-31 ENCOUNTER — Telehealth: Payer: Self-pay | Admitting: *Deleted

## 2020-01-31 MED ORDER — PANTOPRAZOLE SODIUM 20 MG PO TBEC
20.0000 mg | DELAYED_RELEASE_TABLET | Freq: Every day | ORAL | 1 refills | Status: DC
Start: 2020-01-31 — End: 2020-03-05

## 2020-01-31 NOTE — Telephone Encounter (Signed)
Pt niece called on behalf of patient. First issue is patient prescribed medication. The pills are too big to swallow. The second issue is another medication was prescribed but they never picked it up. I told patient niece that clinical team will call her back at [phone] at earliest convenience.

## 2020-01-31 NOTE — Telephone Encounter (Signed)
Honestly, I think we need to defer to the surgical team (Dr. Milas Gain I believe) since they are ones who prescribed the meds.

## 2020-01-31 NOTE — Telephone Encounter (Signed)
Patient is unable to swallow the Augmentin pills even cut in half. She also was supposed to have prescription for Protonix but did not receive this.

## 2020-01-31 NOTE — Telephone Encounter (Signed)
Per Edison Simon the patient may crush her Augmentin and take with food. He has sent in the prescription for Protonix to her pharmacy. The patient will follow up next week with Dr Christian Mate.

## 2020-01-31 NOTE — Telephone Encounter (Signed)
Niece called reporting that patient is having problems swallowing the Augmenting pills even breaking them in half patient gags on them trying to swallow them. She is asking if she could get a liquid form of antibiotics in place of pills. Also reports that she was discharged with instructions to take Pantoprazole, but no prescription was sent to pharmacy. Please advise

## 2020-01-31 NOTE — Telephone Encounter (Signed)
Call returned to [facility] and advised to contact surgeons office regarding this

## 2020-02-01 ENCOUNTER — Telehealth: Payer: Self-pay | Admitting: *Deleted

## 2020-02-01 NOTE — Telephone Encounter (Signed)
I have canceled the treatment and injection.

## 2020-02-01 NOTE — Telephone Encounter (Signed)
We cancelled treatment, but wanted to keep the appt with me and Josh as hospital f/u and further discussion of goals of care.

## 2020-02-01 NOTE — Telephone Encounter (Signed)
Call returned to [facility] and informed of doctor response and she thanked me for letting her know and confirmed that Hospice will be discussed at that time

## 2020-02-01 NOTE — Telephone Encounter (Signed)
Niece called and reports that patient on hold, but she sees that she is scheduled for an infusion at her next appointment, please clarify. She is also asking about prognosis and when to call hospice in stating that they are struggling with this decision. Please return her call [phone].

## 2020-02-02 ENCOUNTER — Encounter: Payer: Self-pay | Admitting: Oncology

## 2020-02-02 NOTE — Progress Notes (Signed)
Patient here today with son. Would like to discuss next steps. Also states they feel breathing has gotten worse. Son is not sure if that could be coming from tumor. Would like to discuss with provider today.

## 2020-02-03 NOTE — Progress Notes (Signed)
[facility]  [phone] [phone]  ID: Sherri [name] OB: 06-26-1932  MR#: [id number]  XBD#:[id number]  Patient Care Team: [name], MD as PCP - General (Family Medicine) Lloyd Huger, MD as Consulting Physician (Oncology) Clent Jacks, RN as Oncology Nurse Navigator  CHIEF COMPLAINT: Stage IVb ovarian cancer.  INTERVAL HISTORY: Patient returns to clinic today as hospital follow-up and further evaluation.  She underwent diverting colostomy approximately 2 weeks ago for malignant obstruction.  She was discharged from the hospital approximately 1 week ago.  She continues to have a poor appetite and early satiety, although admits this is improving.  She has decreased performance status and worsening weakness and fatigue.  She does not complain of pain today.  She has no neurologic complaints.  She denies any recent fevers or illnesses.  She has no chest pain, cough, or hemoptysis.  She denies shortness of breath at rest, but has dyspnea on exertion.  She denies any nausea, vomiting, constipation, or diarrhea.  She has no urinary complaints.  Patient offers no further specific complaints today.  REVIEW OF SYSTEMS:   Review of Systems  Constitutional: Positive for malaise/fatigue and weight loss. Negative for diaphoresis and fever.  Respiratory: Negative.  Negative for cough and shortness of breath.   Cardiovascular: Negative.  Negative for chest pain and leg swelling.  Gastrointestinal: Negative.  Negative for abdominal pain, nausea and vomiting.  Genitourinary: Negative.  Negative for dysuria.  Musculoskeletal: Negative.  Negative for joint pain.  Skin: Negative.  Negative for rash.  Neurological: Positive for weakness. Negative for dizziness, sensory change and headaches.  Psychiatric/Behavioral: Negative.  The patient is not nervous/anxious.     As per HPI. Otherwise, a complete review of systems is negative.  PAST MEDICAL  HISTORY: Past Medical History:  Diagnosis Date  . Anxiety   . Arthritis   . Breast cancer ([place]) 2013   RIGHT lumpectomy  . Bursitis of elbow    RIGHT  . Cancer [facility]) 2013   right breast lumpectomy with a few nodes removed  . Chronic kidney disease    stage 3  . Colitis   . Coronary artery disease   . Diverticulitis   . Dysrhythmia    according to anxiety, tachy  . Family history of breast cancer   . Family history of melanoma   . GERD (gastroesophageal reflux disease)    okay since took her gallbladder out  . H/O lumpectomy   . Heart disease   . History of breast cancer   . History of coronary artery stent placement 2007   one stent  . HOH (hard of hearing)   . Hypercholesteremia   . Hypertension   . Hypothyroidism   . Myocardial infarction ([place])    2007  . Osteopenia   . Palpitations   . Personal history of radiation therapy   . Skin cancer     PAST SURGICAL HISTORY: Past Surgical History:  Procedure Laterality Date  . BREAST LUMPECTOMY Right 2013   lymph node dissection  . CATARACT EXTRACTION W/PHACO Right 07/23/2015   Procedure: CATARACT EXTRACTION PHACO AND INTRAOCULAR LENS PLACEMENT (IOC);  Surgeon: Birder Robson, MD;  Location: [facility] ORS;  Service: Ophthalmology;  Laterality: Right;  [place] 01:24 AP% 26.7 CDE 22.47 fluid pack lot # [id number]  . CATARACT EXTRACTION W/PHACO Left 08/13/2015   Procedure: CATARACT EXTRACTION PHACO AND INTRAOCULAR LENS PLACEMENT (IOC);  Surgeon: Birder Robson, MD;  Location: [facility] ORS;  Service: Ophthalmology;  Laterality: Left;  [place] 02:40 AP% 30.7 CDE 49.28 fluid pack lot # [id number]  . CHOLECYSTECTOMY    . COLONOSCOPY    . CORONARY ANGIOPLASTY  2007  . EYE SURGERY     bilateral cataract  . HARDWARE REMOVAL Right 04/29/2016   Procedure: HARDWARE REMOVAL;  Surgeon: Hessie Knows, MD;  Location: [facility] ORS;  Service: Orthopedics;  Laterality: Right;  . IR IMAGING GUIDED PORT INSERTION  01/12/2020  . ORIF ELBOW FRACTURE Right  10/01/2015   Procedure: OPEN REDUCTION INTERNAL FIXATION (ORIF) ELBOW/OLECRANON FRACTURE;  Surgeon: Hessie Knows, MD;  Location: [facility] ORS;  Service: Orthopedics;  Laterality: Right;  . TRANSVERSE LOOP COLOSTOMY N/A 01/26/2020   Procedure: TRANSVERSE LOOP COLOSTOMY;  Surgeon: Ronny Bacon, MD;  Location: [facility] ORS;  Service: General;  Laterality: N/A;  . VIDEO BRONCHOSCOPY WITH ENDOBRONCHIAL NAVIGATION N/A 01/01/2020   Procedure: VIDEO BRONCHOSCOPY WITH ENDOBRONCHIAL NAVIGATION WITH CELLZIZIO;  Surgeon: Tyler Pita, MD;  Location: [facility] ORS;  Service: Pulmonary;  Laterality: N/A;  . VIDEO BRONCHOSCOPY WITH ENDOBRONCHIAL ULTRASOUND N/A 01/01/2020   Procedure: VIDEO BRONCHOSCOPY WITH ENDOBRONCHIAL ULTRASOUND;  Surgeon: Tyler Pita, MD;  Location: [facility] ORS;  Service: Pulmonary;  Laterality: N/A;    FAMILY HISTORY: Reviewed and unchanged. No reported history of malignancy or chronic disease.     ADVANCED DIRECTIVES:    HEALTH MAINTENANCE: Social History   Tobacco Use  . Smoking status: Former Smoker    Packs/day: 0.25    Years: 2.00    Pack years: 0.50    Types: Cigarettes    Quit date: 12/12/1955    Years since quitting: 64.1  . Smokeless tobacco: Never Used  Vaping Use  . Vaping Use: Never used  Substance Use Topics  . Alcohol use: Yes    Comment: wine occasionally  . Drug use: No     Colonoscopy:  PAP:  Bone density:  Lipid panel:  Allergies  Allergen Reactions  . Prednisone Other (See Comments)    Mood alterations    Current Outpatient Medications  Medication Sig Dispense Refill  . acetaminophen (TYLENOL) 500 MG tablet Take 500 mg by mouth every 8 (eight) hours as needed for moderate pain.     Marland Kitchen aspirin EC 81 MG tablet Take 81 mg by mouth at bedtime. Swallow whole.     . carvedilol (COREG) 3.125 MG tablet Take 3.125 mg by mouth 2 (two) times daily.     . Cholecalciferol (VITAMIN D-3 PO) Take 1,000 mg by mouth daily.     . Coenzyme Q10 (CO Q 10 PO) Take 100  mg by mouth daily.     . feeding supplement, ENSURE ENLIVE, (ENSURE ENLIVE) LIQD Take 237 mLs by mouth 2 (two) times daily between meals. 14220 mL 0  . levothyroxine (SYNTHROID) 25 MCG tablet Take 25 mcg by mouth daily before breakfast.    . lidocaine-prilocaine (EMLA) cream Apply to affected area once 30 g 3  . mirtazapine (REMERON) 7.5 MG tablet Take 7.5 mg by mouth at bedtime.    . Multiple Vitamin (MULTIVITAMIN WITH MINERALS) TABS tablet Take 1 tablet by mouth daily. 30 tablet 0  . nitroGLYCERIN (NITROSTAT) 0.4 MG SL tablet Place 0.4 mg under the tongue every 5 (five) minutes x 3 doses as needed for chest pain.     Marland Kitchen ondansetron (ZOFRAN) 8 MG tablet Take 1 tablet (8 mg total) by mouth 2 (two) times daily as needed for refractory nausea / vomiting. 60 tablet 2  . pantoprazole (PROTONIX) 20 MG tablet Take 1 tablet (20  mg total) by mouth daily. 30 tablet 1  . senna (SENOKOT) 8.6 MG tablet Take 1 tablet by mouth daily as needed for constipation.     . simvastatin (ZOCOR) 40 MG tablet Take 40 mg by mouth at bedtime.      No current facility-administered medications for this visit.    OBJECTIVE: Vitals:   02/06/20 0938  BP: (!) 150/73  Pulse: 69  Resp: 20  Temp: 98.3 F (36.8 C)  SpO2: 96%     Body mass index is 22.37 kg/m.    ECOG FS:2 - Symptomatic, <50% confined to bed  General: Thin, no acute distress.  Sitting in a wheelchair. Eyes: Pink conjunctiva, anicteric sclera. HEENT: Normocephalic, moist mucous membranes. Lungs: No audible wheezing or coughing. Heart: Regular rate and rhythm. Abdomen: Soft, nontender, no obvious distention. Musculoskeletal: No edema, cyanosis, or clubbing. Neuro: Alert, answering all questions appropriately. Cranial nerves grossly intact. Skin: No rashes or petechiae noted. Psych: Normal affect.    LAB RESULTS:  Lab Results  Component Value Date   NA 141 02/06/2020   K 4.2 02/06/2020   CL 103 02/06/2020   CO2 30 02/06/2020   GLUCOSE 101 (H)  02/06/2020   BUN 21 02/06/2020   CREATININE 1.08 (H) 02/06/2020   CALCIUM 8.5 (L) 02/06/2020   PROT 6.3 (L) 02/06/2020   ALBUMIN 2.8 (L) 02/06/2020   AST 30 02/06/2020   ALT 21 02/06/2020   ALKPHOS 91 02/06/2020   BILITOT 0.4 02/06/2020   GFRNONAA 47 (L) 02/06/2020   GFRAA 55 (L) 02/06/2020    Lab Results  Component Value Date   WBC 11.4 (H) 02/06/2020   NEUTROABS 7.7 02/06/2020   HGB 10.7 (L) 02/06/2020   HCT 33.0 (L) 02/06/2020   MCV 94.0 02/06/2020   PLT 294 02/06/2020     STUDIES: CT ABDOMEN PELVIS WO CONTRAST  Result Date: 01/24/2020 CLINICAL DATA:  Abdominal pain, acute nonlocalized in the setting of metastatic ovarian cancer EXAM: CT ABDOMEN AND PELVIS WITHOUT CONTRAST TECHNIQUE: Multidetector CT imaging of the abdomen and pelvis was performed following the standard protocol without IV contrast. COMPARISON:  Chest CT of November 16, 2019 FINDINGS: Lower chest: Signs of pulmonary metastatic disease and interstitial lung disease. No effusion. Lingular nodule measuring approximately 1.4 cm previously 1.4-1.5 cm. The disease in the lung bases grossly unchanged. Upper lobes not assessed Hepatobiliary: Interval development of ascites since the prior study. No focal, suspicious hepatic lesion. Fluid is mainly about the liver. Pancreas: Pancreas without ductal dilation, inflammation or focal lesion. Spleen: Spleen normal size with lobulated contours. Adrenals/Urinary Tract: Adrenal glands are normal. Marked RIGHT-sided hydroureteronephrosis with chronic appearing RIGHT-sided cortical thinning. RIGHT ureteral involvement from RIGHT pelvic soft tissue mass (image 66, series 2) 4.9 x 3.2 cm, contiguous with RIGHT adnexa and inseparable from with may represent the uterus but is obscured by soft tissue. Distortion also at the RIGHT bladder base on image 77 of series 2. No LEFT-sided hydronephrosis. Cyst in the LEFT kidney. Stomach/Bowel: Distal colonic obstruction with sigmoid narrowing best seen  on image 67 of series 2. Irregular margins of the sigmoid colon at this level. Process results in small bowel dilation. There is a large duodenal diverticulum. Pericolonic stranding throughout. Some nodularity of lateral conal fascia and fascial planes in the abdomen. Omental nodularity seen anterior to the transverse colon on image 52 of series 2. No free air. Tethering of small bowel loops in the pelvis. Small hiatal hernia. Vascular/Lymphatic: Calcified atheromatous plaque of the abdominal aorta. Gastrohepatic  adenopathy is suspected. Soft tissue may also be present about the porta hepatis. No aneurysmal dilation. Pelvic mass encases the RIGHT ureter and external iliac vasculature on the RIGHT. Reproductive: Mass in the RIGHT hemipelvis inseparable from remaining pelvic viscera. Also tethering the upper portion of the urinary bladder and RIGHT bladder wall. Other: Small volume ascites mainly about the liver. Musculoskeletal: Spinal degenerative changes. No acute or destructive bone finding. IMPRESSION: 1. Distal colonic obstruction associated with colitis presumably due to metastatic disease to the surface of the sigmoid colon. Signs of colitis with bowel distension of the colon up to 8 cm. GI and or surgical consultation may be helpful. 2. Mass in the RIGHT hemipelvis obstructs the RIGHT ureter. Chronic thinning of the RIGHT renal cortex in the setting of severe RIGHT-sided hydro ureteral nephrosis. Mass also involves the RIGHT urinary bladder, the uterus and tethers small-bowel loops in the pelvis and adjacent sigmoid colon. 3. Extension of mass into the RIGHT pelvic sidewall as described. 4. Findings that suggest more diffuse peritoneal involvement. A component of the ascitic fluid could also be due to colonic inflammation. 5. Evidence of pulmonary metastatic disease and interstitial lung disease. 6. Signs of pulmonary metastatic disease and interstitial lung disease. 7. Aortic atherosclerosis. Aortic  Atherosclerosis (ICD10-I70.0). Electronically Signed   By: Zetta Bills M.D.   On: 01/24/2020 14:28   US Abdomen Limited  Result Date: 01/23/2020 CLINICAL DATA:  Ovarian cancer. Patient with abdominal distension. Evaluate for ascites. EXAM: LIMITED ABDOMEN ULTRASOUND FOR ASCITES TECHNIQUE: Limited ultrasound survey for ascites was performed in all four abdominal quadrants. COMPARISON:  PET-CT 12/11/2019 FINDINGS: No significant ascites. Marked dilatation of the right renal collecting system appears chronic and similar to the recent PET-CT. IMPRESSION: No evidence for ascites. Chronic right hydronephrosis. Electronically Signed   By: Markus Daft M.D.   On: 01/23/2020 15:48   DG ABD ACUTE 2+V W 1V CHEST  Result Date: 01/25/2020 CLINICAL DATA:  Colonic obstruction EXAM: DG ABDOMEN ACUTE W/ 1V CHEST COMPARISON:  Abdominal CT from yesterday FINDINGS: Low colonic obstruction. A loop in the central abdomen measures up to 7 cm in diameter, essentially stable from scanogram yesterday. No evidence of extraluminal gas collection. Reticulonodular opacities in the bilateral lungs. There is metastatic disease and pulmonary fibrosis at the lung bases by prior CT. Normal heart size. No visible effusion or pneumothorax. IMPRESSION: 1. Low colonic obstruction associated with malignancy. The degree of distension is similar to CT from yesterday. 2. Known pulmonary metastases with superimposed interstitial lung disease. Electronically Signed   By: Monte Fantasia M.D.   On: 01/25/2020 06:36   DG Abd Portable 1V  Result Date: 01/26/2020 CLINICAL DATA:  Colonic obstruction. EXAM: PORTABLE ABDOMEN - 1 VIEW COMPARISON:  Abdomen 01/25/2020.  CT 01/24/2020. FINDINGS: Persistent diffuse colonic and small bowel distention without interim change from prior exam. Findings consistent with distal colonic obstruction as noted previously on CT of 01/24/2020. No pneumatosis. No free air noted. Diffuse osteopenia. Degenerative changes  lumbar spine and both hips. IMPRESSION: Persistent diffuse colonic and small bowel distention without interim change from prior exam. Findings consistent with distal colonic obstruction as noted on prior CT of 01/24/2020. Electronically Signed   By: Marcello Moores  Register   On: 01/26/2020 05:54   IR IMAGING GUIDED PORT INSERTION  Result Date: 01/17/2020 CLINICAL DATA:  Ovarian neoplasm, needs durable venous access for planned treatment regimen EXAM: TUNNELED PORT CATHETER PLACEMENT WITH ULTRASOUND AND FLUOROSCOPIC GUIDANCE FLUOROSCOPY TIME:  0.1 minute; 0.31 mGy ANESTHESIA/SEDATION: Intravenous Fentanyl  7mcg and Versed 0.5mg  were administered as conscious sedation during continuous monitoring of the patient's level of consciousness and physiological / cardiorespiratory status by the radiology RN, with a total moderate sedation time of 32 minutes. TECHNIQUE: The procedure, risks, benefits, and alternatives were explained to the patient. Questions regarding the procedure were encouraged and answered. The patient understands and consents to the procedure. As antibiotic prophylaxis, cefazolin 2 g was ordered pre-procedure and administered intravenously within 1 hour of incision. Patency of the right IJ vein was confirmed with ultrasound with image documentation. An appropriate skin site was determined. Skin site was marked. Region was prepped using maximum barrier technique including cap and mask, sterile gown, sterile gloves, large sterile sheet, and Chlorhexidine as cutaneous antisepsis. The region was infiltrated locally with 1% lidocaine. Under real-time ultrasound guidance, the right IJ vein was accessed with a 21 gauge micropuncture needle; the needle tip within the vein was confirmed with ultrasound image documentation. Needle was exchanged over a 018 guidewire for transitional dilator, and vascular measurement was performed. A small incision was made on the right anterior chest wall and a subcutaneous pocket  fashioned. The power-injectable port was positioned and its catheter tunneled to the right IJ dermatotomy site. The transitional dilator was exchanged over an Amplatz wire for a peel-away sheath, through which the port catheter, which had been trimmed to the appropriate length, was advanced and positioned under fluoroscopy with its tip at the cavoatrial junction. Spot chest radiograph confirms good catheter position and no pneumothorax. The port was flushed per protocol. The pocket was closed with deep interrupted and subcuticular continuous 3-0 Monocryl sutures. The incisions were covered with Dermabond then covered with a sterile dressing. The patient tolerated the procedure well. COMPLICATIONS: COMPLICATIONS None immediate IMPRESSION: Technically successful right IJ power-injectable port catheter placement. Ready for routine use. Electronically Signed   By: Lucrezia Europe M.D.   On: 01/17/2020 08:23    ASSESSMENT: Stage IVb ovarian cancer.  PLAN:    1.  Stage IVb ovarian cancer: Despite history of breast cancer, biopsy consistent with carcinoma of gynecologic origin.  PET scan results from December 11, 2019 reviewed independently with large right pelvic region, extensive retroperitoneal lymphadenopathy and bilateral pulmonary disease consistent with metastatic ovarian cancer.  CA-125 is significantly elevated greater than 1000.  Appreciate gynecology oncology input.  Patient only received 1 dose of dose reduced carboplatinum and Taxol on January 16, 2020 before requiring surgery for malignant obstruction related to her malignancy.  Patient is unclear if she wishes to continue with treatments when she recovers from her surgery or pursue comfort and hospice care.  Return to clinic in 2 weeks for further evaluation and additional discussion on whether to continue treatment with dose reduced carboplatinum, Taxol, and on pro-Neulasta support.  Appreciate palliative care input. 2.  History of breast cancer: Patient has  a history of stage IIb ER/PR positive, upper inner quadrant right breast.  Previously we did not have access to patient's pathology report, her stage and location of breast were determined based on clinic notes.  She received 6 months of Arimidex between April 2013 and October 2013 prior to undergoing right breast lumpectomy. She reports she underwent adjuvant XRT, but did not receive adjuvant chemotherapy.  Patient completed 5 years of Arimidex in October 2018.  Her most recent mammogram on January 30, 2019 was reported as BI-RADS 1.   3.  Anxiety: Patient receives IV Ativan with patient's premeds for each treatment.  Continue Xanax as needed at home.  4.  Diverting colostomy: Continue home health care and follow-up with surgery as indicated. 5.  Leukocytosis: White count is improving.  Patient has discontinued antibiotics. 6.  Anemia: Chronic and unchanged.  Monitor.  Patient expressed understanding and was in agreement with this plan. She also understands that She can call clinic at any time with any questions, concerns, or complaints.    Lloyd Huger, MD   02/06/2020 11:36 AM

## 2020-02-06 ENCOUNTER — Inpatient Hospital Stay (HOSPITAL_BASED_OUTPATIENT_CLINIC_OR_DEPARTMENT_OTHER): Payer: Medicare PPO | Admitting: Hospice and Palliative Medicine

## 2020-02-06 ENCOUNTER — Inpatient Hospital Stay: Payer: Medicare PPO | Attending: Oncology

## 2020-02-06 ENCOUNTER — Encounter: Payer: Self-pay | Admitting: Oncology

## 2020-02-06 ENCOUNTER — Inpatient Hospital Stay (HOSPITAL_BASED_OUTPATIENT_CLINIC_OR_DEPARTMENT_OTHER): Payer: Medicare PPO | Admitting: Oncology

## 2020-02-06 ENCOUNTER — Other Ambulatory Visit: Payer: Self-pay

## 2020-02-06 ENCOUNTER — Inpatient Hospital Stay: Payer: Medicare PPO

## 2020-02-06 ENCOUNTER — Telehealth: Payer: Self-pay | Admitting: *Deleted

## 2020-02-06 VITALS — BP 150/73 | HR 69 | Temp 98.3°F | Resp 20 | Wt 130.3 lb

## 2020-02-06 DIAGNOSIS — D649 Anemia, unspecified: Secondary | ICD-10-CM | POA: Diagnosis not present

## 2020-02-06 DIAGNOSIS — C7802 Secondary malignant neoplasm of left lung: Secondary | ICD-10-CM | POA: Diagnosis not present

## 2020-02-06 DIAGNOSIS — Z933 Colostomy status: Secondary | ICD-10-CM | POA: Diagnosis not present

## 2020-02-06 DIAGNOSIS — R54 Age-related physical debility: Secondary | ICD-10-CM | POA: Diagnosis not present

## 2020-02-06 DIAGNOSIS — C7801 Secondary malignant neoplasm of right lung: Secondary | ICD-10-CM | POA: Diagnosis not present

## 2020-02-06 DIAGNOSIS — C569 Malignant neoplasm of unspecified ovary: Secondary | ICD-10-CM | POA: Diagnosis present

## 2020-02-06 DIAGNOSIS — Z87891 Personal history of nicotine dependence: Secondary | ICD-10-CM | POA: Insufficient documentation

## 2020-02-06 DIAGNOSIS — R5383 Other fatigue: Secondary | ICD-10-CM | POA: Insufficient documentation

## 2020-02-06 DIAGNOSIS — R6881 Early satiety: Secondary | ICD-10-CM

## 2020-02-06 DIAGNOSIS — Z923 Personal history of irradiation: Secondary | ICD-10-CM | POA: Insufficient documentation

## 2020-02-06 DIAGNOSIS — R531 Weakness: Secondary | ICD-10-CM | POA: Diagnosis not present

## 2020-02-06 DIAGNOSIS — Z853 Personal history of malignant neoplasm of breast: Secondary | ICD-10-CM | POA: Insufficient documentation

## 2020-02-06 DIAGNOSIS — Z515 Encounter for palliative care: Secondary | ICD-10-CM | POA: Diagnosis not present

## 2020-02-06 DIAGNOSIS — Z7289 Other problems related to lifestyle: Secondary | ICD-10-CM | POA: Insufficient documentation

## 2020-02-06 DIAGNOSIS — R140 Abdominal distension (gaseous): Secondary | ICD-10-CM

## 2020-02-06 DIAGNOSIS — F419 Anxiety disorder, unspecified: Secondary | ICD-10-CM | POA: Diagnosis not present

## 2020-02-06 DIAGNOSIS — D72829 Elevated white blood cell count, unspecified: Secondary | ICD-10-CM | POA: Insufficient documentation

## 2020-02-06 LAB — CBC WITH DIFFERENTIAL/PLATELET
Abs Immature Granulocytes: 0.1 10*3/uL — ABNORMAL HIGH (ref 0.00–0.07)
Basophils Absolute: 0.1 10*3/uL (ref 0.0–0.1)
Basophils Relative: 1 %
Eosinophils Absolute: 0 10*3/uL (ref 0.0–0.5)
Eosinophils Relative: 0 %
HCT: 33 % — ABNORMAL LOW (ref 36.0–46.0)
Hemoglobin: 10.7 g/dL — ABNORMAL LOW (ref 12.0–15.0)
Immature Granulocytes: 1 %
Lymphocytes Relative: 25 %
Lymphs Abs: 2.8 10*3/uL (ref 0.7–4.0)
MCH: 30.5 pg (ref 26.0–34.0)
MCHC: 32.4 g/dL (ref 30.0–36.0)
MCV: 94 fL (ref 80.0–100.0)
Monocytes Absolute: 0.7 10*3/uL (ref 0.1–1.0)
Monocytes Relative: 6 %
Neutro Abs: 7.7 10*3/uL (ref 1.7–7.7)
Neutrophils Relative %: 67 %
Platelets: 294 10*3/uL (ref 150–400)
RBC: 3.51 MIL/uL — ABNORMAL LOW (ref 3.87–5.11)
RDW: 15.9 % — ABNORMAL HIGH (ref 11.5–15.5)
WBC: 11.4 10*3/uL — ABNORMAL HIGH (ref 4.0–10.5)
nRBC: 0 % (ref 0.0–0.2)

## 2020-02-06 LAB — COMPREHENSIVE METABOLIC PANEL
ALT: 21 U/L (ref 0–44)
AST: 30 U/L (ref 15–41)
Albumin: 2.8 g/dL — ABNORMAL LOW (ref 3.5–5.0)
Alkaline Phosphatase: 91 U/L (ref 38–126)
Anion gap: 8 (ref 5–15)
BUN: 21 mg/dL (ref 8–23)
CO2: 30 mmol/L (ref 22–32)
Calcium: 8.5 mg/dL — ABNORMAL LOW (ref 8.9–10.3)
Chloride: 103 mmol/L (ref 98–111)
Creatinine, Ser: 1.08 mg/dL — ABNORMAL HIGH (ref 0.44–1.00)
GFR calc Af Amer: 55 mL/min — ABNORMAL LOW (ref >60)
GFR calc non Af Amer: 47 mL/min — ABNORMAL LOW (ref >60)
Glucose, Bld: 101 mg/dL — ABNORMAL HIGH (ref 70–99)
Potassium: 4.2 mmol/L (ref 3.5–5.1)
Sodium: 141 mmol/L (ref 135–145)
Total Bilirubin: 0.4 mg/dL (ref 0.3–1.2)
Total Protein: 6.3 g/dL — ABNORMAL LOW (ref 6.5–8.1)

## 2020-02-06 NOTE — Telephone Encounter (Signed)
Home Health called to give care orders for status post colostomy. Orders deferred to surgeon Dr. Christian Mate. Left detailed message on Home Health line.

## 2020-02-06 NOTE — Progress Notes (Signed)
Sherri [name]  Telephone:([phone] Fax:(336) [phone]   Name: Sherri [name] Date: 02/06/2020 MRN: [id number]  DOB: [birth year]  Patient Care Team: [name], MD as PCP - General (Family Medicine) Lloyd Huger, MD as Consulting Physician (Oncology) Clent Jacks, RN as Oncology Nurse Navigator    REASON FOR CONSULTATION: Sherri [name] is a 86 y.o. female with multiple medical problems including history of stage IIb breast cancer status post lumpectomy and XRT with completion of 5 years of Arimidex in October 2019. She was subsequently diagnosed with stage IV ovarian cancer in June 2021. PET scan on 12/11/2019 revealed diffuse pulmonary metastatic disease with extensive retroperitoneal lymphadenopathy and right ureter obstructed by her pelvic mass with right-sided hydronephrosis. Currently on systemic chemotherapy.  Patient was hospitalized 01/24/2020-01/30/2020 with progressive abdominal pain and distention.  CT of abdomen and pelvis revealed colonic obstruction secondary to her large right pelvic malignancy.  She is s/p diverting loop colostomy on 8/27 for palliation.  Palliative care was consulted to help address goals and manage ongoing symptoms.   SOCIAL HISTORY:     reports that she quit smoking about 64 years ago. Her smoking use included cigarettes. She has a 0.50 pack-year smoking history. She has never used smokeless tobacco. She reports current alcohol use. She reports that she does not use drugs.  Patient is divorced. She lives at home with her brother. She has a son in [place] and another son and daughter who live out of state. Patient has a nephew who is very involved in her care.  Patient previously worked as a Educational psychologist.  ADVANCE DIRECTIVES:  Does not have  CODE STATUS: DNR  PAST MEDICAL HISTORY: Past Medical History:  Diagnosis Date   Anxiety    Arthritis    Breast cancer ([place]) 2013    RIGHT lumpectomy   Bursitis of elbow    RIGHT   Cancer ([place]) 2013   right breast lumpectomy with a few nodes removed   Chronic kidney disease    stage 3   Colitis    Coronary artery disease    Diverticulitis    Dysrhythmia    according to anxiety, tachy   Family history of breast cancer    Family history of melanoma    GERD (gastroesophageal reflux disease)    okay since took her gallbladder out   H/O lumpectomy    Heart disease    History of breast cancer    History of coronary artery stent placement 2007   one stent   HOH (hard of hearing)    Hypercholesteremia    Hypertension    Hypothyroidism    Myocardial infarction [facility])    2007   Osteopenia    Palpitations    Personal history of radiation therapy    Skin cancer     PAST SURGICAL HISTORY:  Past Surgical History:  Procedure Laterality Date   BREAST LUMPECTOMY Right 2013   lymph node dissection   CATARACT EXTRACTION W/PHACO Right 07/23/2015   Procedure: CATARACT EXTRACTION PHACO AND INTRAOCULAR LENS PLACEMENT (IOC);  Surgeon: Birder Robson, MD;  Location: [facility] ORS;  Service: Ophthalmology;  Laterality: Right;  [place] 01:24 AP% 26.7 CDE 22.47 fluid pack lot # [id number]   CATARACT EXTRACTION W/PHACO Left 08/13/2015   Procedure: CATARACT EXTRACTION PHACO AND INTRAOCULAR LENS PLACEMENT (IOC);  Surgeon: Birder Robson, MD;  Location: [facility];  Service: Ophthalmology;  Laterality: Left;  [place] 02:40 AP% 30.7 CDE 49.28 fluid pack  lot # [id number]   CHOLECYSTECTOMY     COLONOSCOPY     CORONARY ANGIOPLASTY  2007   EYE SURGERY     bilateral cataract   HARDWARE REMOVAL Right 04/29/2016   Procedure: HARDWARE REMOVAL;  Surgeon: Hessie Knows, MD;  Location: [facility];  Service: Orthopedics;  Laterality: Right;   IR IMAGING GUIDED PORT INSERTION  01/12/2020   ORIF ELBOW FRACTURE Right 10/01/2015   Procedure: OPEN REDUCTION INTERNAL FIXATION (ORIF) ELBOW/OLECRANON FRACTURE;  Surgeon: Hessie Knows,  MD;  Location: [facility];  Service: Orthopedics;  Laterality: Right;   TRANSVERSE LOOP COLOSTOMY N/A 01/26/2020   Procedure: TRANSVERSE LOOP COLOSTOMY;  Surgeon: Ronny Bacon, MD;  Location: [facility];  Service: General;  Laterality: N/A;   VIDEO BRONCHOSCOPY WITH ENDOBRONCHIAL NAVIGATION N/A 01/01/2020   Procedure: VIDEO BRONCHOSCOPY WITH ENDOBRONCHIAL NAVIGATION WITH CELLZIZIO;  Surgeon: Tyler Pita, MD;  Location: [facility];  Service: Pulmonary;  Laterality: N/A;   VIDEO BRONCHOSCOPY WITH ENDOBRONCHIAL ULTRASOUND N/A 01/01/2020   Procedure: VIDEO BRONCHOSCOPY WITH ENDOBRONCHIAL ULTRASOUND;  Surgeon: Tyler Pita, MD;  Location: [facility];  Service: Pulmonary;  Laterality: N/A;    HEMATOLOGY/ONCOLOGY HISTORY:  Oncology History  Ovarian cancer ([place])  01/05/2020 Initial Diagnosis   Ovarian cancer ([place])   01/12/2020 Cancer Staging   Staging form: Ovary, Fallopian Tube, and Primary Peritoneal Carcinoma, AJCC 8th Edition - Clinical: FIGO Stage IVB (cTX, cN1b, pM1b) - Signed by Lloyd Huger, MD on 01/12/2020   01/16/2020 -  Chemotherapy   The patient had palonosetron (ALOXI) injection 0.25 mg, 0.25 mg, Intravenous,  Once, 1 of 6 cycles Administration: 0.25 mg (01/16/2020) pegfilgrastim (NEULASTA ONPRO KIT) injection 6 mg, 6 mg, Subcutaneous, Once, 1 of 6 cycles Administration: 6 mg (01/16/2020) CARBOplatin (PARAPLATIN) 210 mg in sodium chloride 0.9 % 250 mL chemo infusion, 210 mg (103.7 % of original dose 204.4 mg), Intravenous,  Once, 1 of 6 cycles Dose modification:   (original dose 204.4 mg, Cycle 1) Administration: 210 mg (01/16/2020) fosaprepitant (EMEND) 150 mg in sodium chloride 0.9 % 145 mL IVPB, 150 mg, Intravenous,  Once, 1 of 6 cycles Administration: 150 mg (01/16/2020) PACLitaxel (TAXOL) 180 mg in sodium chloride 0.9 % 250 mL chemo infusion (> 29m/m2), 110 mg/m2 = 180 mg (100 % of original dose 110 mg/m2), Intravenous,  Once, 1 of 6 cycles Dose modification: 110 mg/m2  (original dose 110 mg/m2, Cycle 1, Reason: Patient Age) Administration: 180 mg (01/16/2020)  for chemotherapy treatment.      ALLERGIES:  is allergic to prednisone.  MEDICATIONS:  Current Outpatient Medications  Medication Sig Dispense Refill   acetaminophen (TYLENOL) 500 MG tablet Take 500 mg by mouth every 8 (eight) hours as needed for moderate pain.      aspirin EC 81 MG tablet Take 81 mg by mouth at bedtime. Swallow whole.      carvedilol (COREG) 3.125 MG tablet Take 3.125 mg by mouth 2 (two) times daily.      Cholecalciferol (VITAMIN D-3 PO) Take 1,000 mg by mouth daily.      Coenzyme Q10 (CO Q 10 PO) Take 100 mg by mouth daily.      feeding supplement, ENSURE ENLIVE, (ENSURE ENLIVE) LIQD Take 237 mLs by mouth 2 (two) times daily between meals. 14220 mL 0   levothyroxine (SYNTHROID) 25 MCG tablet Take 25 mcg by mouth daily before breakfast.     lidocaine-prilocaine (EMLA) cream Apply to affected area once 30 g 3   mirtazapine (REMERON) 7.5 MG tablet Take 7.5  mg by mouth at bedtime.     Multiple Vitamin (MULTIVITAMIN WITH MINERALS) TABS tablet Take 1 tablet by mouth daily. 30 tablet 0   nitroGLYCERIN (NITROSTAT) 0.4 MG SL tablet Place 0.4 mg under the tongue every 5 (five) minutes x 3 doses as needed for chest pain.      ondansetron (ZOFRAN) 8 MG tablet Take 1 tablet (8 mg total) by mouth 2 (two) times daily as needed for refractory nausea / vomiting. 60 tablet 2   pantoprazole (PROTONIX) 20 MG tablet Take 1 tablet (20 mg total) by mouth daily. 30 tablet 1   senna (SENOKOT) 8.6 MG tablet Take 1 tablet by mouth daily as needed for constipation.      simvastatin (ZOCOR) 40 MG tablet Take 40 mg by mouth at bedtime.      No current facility-administered medications for this visit.    VITAL SIGNS: There were no vitals taken for this visit. There were no vitals filed for this visit.  Estimated body mass index is 22.37 kg/m as calculated from the following:   Height as  of 01/24/20: 5' 4" (1.626 m).   Weight as of an earlier encounter on 02/06/20: 130 lb 4.8 oz (59.1 kg).  LABS: CBC:    Component Value Date/Time   WBC 11.4 (H) 02/06/2020 0946   HGB 10.7 (L) 02/06/2020 0946   HCT 33.0 (L) 02/06/2020 0946   PLT 294 02/06/2020 0946   MCV 94.0 02/06/2020 0946   NEUTROABS 7.7 02/06/2020 0946   LYMPHSABS 2.8 02/06/2020 0946   MONOABS 0.7 02/06/2020 0946   EOSABS 0.0 02/06/2020 0946   BASOSABS 0.1 02/06/2020 0946   Comprehensive Metabolic Panel:    Component Value Date/Time   NA 141 02/06/2020 0946   K 4.2 02/06/2020 0946   CL 103 02/06/2020 0946   CO2 30 02/06/2020 0946   BUN 21 02/06/2020 0946   CREATININE 1.08 (H) 02/06/2020 0946   GLUCOSE 101 (H) 02/06/2020 0946   CALCIUM 8.5 (L) 02/06/2020 0946   AST 30 02/06/2020 0946   ALT 21 02/06/2020 0946   ALKPHOS 91 02/06/2020 0946   BILITOT 0.4 02/06/2020 0946   PROT 6.3 (L) 02/06/2020 0946   ALBUMIN 2.8 (L) 02/06/2020 0946    RADIOGRAPHIC STUDIES: CT ABDOMEN PELVIS WO CONTRAST  Result Date: 01/24/2020 CLINICAL DATA:  Abdominal pain, acute nonlocalized in the setting of metastatic ovarian cancer EXAM: CT ABDOMEN AND PELVIS WITHOUT CONTRAST TECHNIQUE: Multidetector CT imaging of the abdomen and pelvis was performed following the standard protocol without IV contrast. COMPARISON:  Chest CT of November 16, 2019 FINDINGS: Lower chest: Signs of pulmonary metastatic disease and interstitial lung disease. No effusion. Lingular nodule measuring approximately 1.4 cm previously 1.4-1.5 cm. The disease in the lung bases grossly unchanged. Upper lobes not assessed Hepatobiliary: Interval development of ascites since the prior study. No focal, suspicious hepatic lesion. Fluid is mainly about the liver. Pancreas: Pancreas without ductal dilation, inflammation or focal lesion. Spleen: Spleen normal size with lobulated contours. Adrenals/Urinary Tract: Adrenal glands are normal. Marked RIGHT-sided hydroureteronephrosis with  chronic appearing RIGHT-sided cortical thinning. RIGHT ureteral involvement from RIGHT pelvic soft tissue mass (image 66, series 2) 4.9 x 3.2 cm, contiguous with RIGHT adnexa and inseparable from with may represent the uterus but is obscured by soft tissue. Distortion also at the RIGHT bladder base on image 77 of series 2. No LEFT-sided hydronephrosis. Cyst in the LEFT kidney. Stomach/Bowel: Distal colonic obstruction with sigmoid narrowing best seen on image 67 of series 2.  Irregular margins of the sigmoid colon at this level. Process results in small bowel dilation. There is a large duodenal diverticulum. Pericolonic stranding throughout. Some nodularity of lateral conal fascia and fascial planes in the abdomen. Omental nodularity seen anterior to the transverse colon on image 52 of series 2. No free air. Tethering of small bowel loops in the pelvis. Small hiatal hernia. Vascular/Lymphatic: Calcified atheromatous plaque of the abdominal aorta. Gastrohepatic adenopathy is suspected. Soft tissue may also be present about the porta hepatis. No aneurysmal dilation. Pelvic mass encases the RIGHT ureter and external iliac vasculature on the RIGHT. Reproductive: Mass in the RIGHT hemipelvis inseparable from remaining pelvic viscera. Also tethering the upper portion of the urinary bladder and RIGHT bladder wall. Other: Small volume ascites mainly about the liver. Musculoskeletal: Spinal degenerative changes. No acute or destructive bone finding. IMPRESSION: 1. Distal colonic obstruction associated with colitis presumably due to metastatic disease to the surface of the sigmoid colon. Signs of colitis with bowel distension of the colon up to 8 cm. GI and or surgical consultation may be helpful. 2. Mass in the RIGHT hemipelvis obstructs the RIGHT ureter. Chronic thinning of the RIGHT renal cortex in the setting of severe RIGHT-sided hydro ureteral nephrosis. Mass also involves the RIGHT urinary bladder, the uterus and  tethers small-bowel loops in the pelvis and adjacent sigmoid colon. 3. Extension of mass into the RIGHT pelvic sidewall as described. 4. Findings that suggest more diffuse peritoneal involvement. A component of the ascitic fluid could also be due to colonic inflammation. 5. Evidence of pulmonary metastatic disease and interstitial lung disease. 6. Signs of pulmonary metastatic disease and interstitial lung disease. 7. Aortic atherosclerosis. Aortic Atherosclerosis (ICD10-I70.0). Electronically Signed   By: Zetta Bills M.D.   On: 01/24/2020 14:28   US Abdomen Limited  Result Date: 01/23/2020 CLINICAL DATA:  Ovarian cancer. Patient with abdominal distension. Evaluate for ascites. EXAM: LIMITED ABDOMEN ULTRASOUND FOR ASCITES TECHNIQUE: Limited ultrasound survey for ascites was performed in all four abdominal quadrants. COMPARISON:  PET-CT 12/11/2019 FINDINGS: No significant ascites. Marked dilatation of the right renal collecting system appears chronic and similar to the recent PET-CT. IMPRESSION: No evidence for ascites. Chronic right hydronephrosis. Electronically Signed   By: Markus Daft M.D.   On: 01/23/2020 15:48   DG ABD ACUTE 2+V W 1V CHEST  Result Date: 01/25/2020 CLINICAL DATA:  Colonic obstruction EXAM: DG ABDOMEN ACUTE W/ 1V CHEST COMPARISON:  Abdominal CT from yesterday FINDINGS: Low colonic obstruction. A loop in the central abdomen measures up to 7 cm in diameter, essentially stable from scanogram yesterday. No evidence of extraluminal gas collection. Reticulonodular opacities in the bilateral lungs. There is metastatic disease and pulmonary fibrosis at the lung bases by prior CT. Normal heart size. No visible effusion or pneumothorax. IMPRESSION: 1. Low colonic obstruction associated with malignancy. The degree of distension is similar to CT from yesterday. 2. Known pulmonary metastases with superimposed interstitial lung disease. Electronically Signed   By: Monte Fantasia M.D.   On:  01/25/2020 06:36   DG Abd Portable 1V  Result Date: 01/26/2020 CLINICAL DATA:  Colonic obstruction. EXAM: PORTABLE ABDOMEN - 1 VIEW COMPARISON:  Abdomen 01/25/2020.  CT 01/24/2020. FINDINGS: Persistent diffuse colonic and small bowel distention without interim change from prior exam. Findings consistent with distal colonic obstruction as noted previously on CT of 01/24/2020. No pneumatosis. No free air noted. Diffuse osteopenia. Degenerative changes lumbar spine and both hips. IMPRESSION: Persistent diffuse colonic and small bowel distention without interim change  from prior exam. Findings consistent with distal colonic obstruction as noted on prior CT of 01/24/2020. Electronically Signed   By: Marcello Moores  Register   On: 01/26/2020 05:54   IR IMAGING GUIDED PORT INSERTION  Result Date: 01/17/2020 CLINICAL DATA:  Ovarian neoplasm, needs durable venous access for planned treatment regimen EXAM: TUNNELED PORT CATHETER PLACEMENT WITH ULTRASOUND AND FLUOROSCOPIC GUIDANCE FLUOROSCOPY TIME:  0.1 minute; 0.31 mGy ANESTHESIA/SEDATION: Intravenous Fentanyl 8mg and Versed 0.519mwere administered as conscious sedation during continuous monitoring of the patient's level of consciousness and physiological / cardiorespiratory status by the radiology RN, with a total moderate sedation time of 32 minutes. TECHNIQUE: The procedure, risks, benefits, and alternatives were explained to the patient. Questions regarding the procedure were encouraged and answered. The patient understands and consents to the procedure. As antibiotic prophylaxis, cefazolin 2 g was ordered pre-procedure and administered intravenously within 1 hour of incision. Patency of the right IJ vein was confirmed with ultrasound with image documentation. An appropriate skin site was determined. Skin site was marked. Region was prepped using maximum barrier technique including cap and mask, sterile gown, sterile gloves, large sterile sheet, and Chlorhexidine as  cutaneous antisepsis. The region was infiltrated locally with 1% lidocaine. Under real-time ultrasound guidance, the right IJ vein was accessed with a 21 gauge micropuncture needle; the needle tip within the vein was confirmed with ultrasound image documentation. Needle was exchanged over a 018 guidewire for transitional dilator, and vascular measurement was performed. A small incision was made on the right anterior chest wall and a subcutaneous pocket fashioned. The power-injectable port was positioned and its catheter tunneled to the right IJ dermatotomy site. The transitional dilator was exchanged over an Amplatz wire for a peel-away sheath, through which the port catheter, which had been trimmed to the appropriate length, was advanced and positioned under fluoroscopy with its tip at the cavoatrial junction. Spot chest radiograph confirms good catheter position and no pneumothorax. The port was flushed per protocol. The pocket was closed with deep interrupted and subcuticular continuous 3-0 Monocryl sutures. The incisions were covered with Dermabond then covered with a sterile dressing. The patient tolerated the procedure well. COMPLICATIONS: COMPLICATIONS None immediate IMPRESSION: Technically successful right IJ power-injectable port catheter placement. Ready for routine use. Electronically Signed   By: D Lucrezia Europe.D.   On: 01/17/2020 08:23    PERFORMANCE STATUS (ECOG) : 2 - Symptomatic, <50% confined to bed  Review of Systems Unless otherwise noted, a complete review of systems is negative.  Physical Exam General: NAD, frail appearing Pulmonary: Unlabored Extremities: no edema, no joint deformities Skin: no rashes Neurological: Weakness but otherwise nonfocal  IMPRESSION: Post hospital follow-up visit.  Patient reports that she is doing well.  She denies any significant changes or concerns since discharging home from the hospital.  She does remain frail and weak but is getting around some with  assistance by family.  Home health was ordered but she has yet to receive PT or OT services.  Will have nurse call home health company to inquire.  We will also refer her to [facility]  Family are caring for ostomy with plan for surgical follow-up later this week.  Patient seems to be having good ostomy output.  Abdominal distention has improved.  Patient says she has had some leakage around bag.  We will see if WOCN nurse can evaluate in the clinic.  Patient endorses early satiety.  Discussed importance of high-calorie and high-protein foods with smaller portions but more frequently  throughout the day.  Also encouraged oral supplements.  Will refer to nutrition.  Patient saw Dr. [name] earlier today with discussion of hospice versus future treatment.  I suspect that patient will be interested in future treatment if options are available.  Plan is for follow-up visit in 2 weeks.  PLAN: -Continue current scope of treatment -Referral to OT, WOCN, and nutrition -Increase oral supplements -RTC in 2 weeks  Case and plan discussed with Dr. [name]  Patient expressed understanding and was in agreement with this plan. She also understands that She can call the clinic at any time with any questions, concerns, or complaints.     Time Total: 20 minutes  Visit consisted of counseling and education dealing with the complex and emotionally intense issues of symptom management and palliative care in the setting of serious and potentially life-threatening illness.Greater than 50%  of this time was spent counseling and coordinating care related to the above assessment and plan.  Signed by: Altha Harm, PhD, NP-C

## 2020-02-07 ENCOUNTER — Telehealth: Payer: Self-pay | Admitting: Surgery

## 2020-02-07 LAB — CA 125: Cancer Antigen (CA) 125: 1176 U/mL — ABNORMAL HIGH (ref 0.0–38.1)

## 2020-02-07 NOTE — Telephone Encounter (Signed)
Incoming call from [employer] with [facility].  Please call her at [phone].  Dr. Christian Mate placed colostomy on 01/26/20.  She is needing orders for the following:  Management and teaching the family to change her bag, frequency for nursing 1 x 1 week, 2 x 1 week, 1 x every other week for 6 weeks, then 1 prn.  Also needs order for home health aide 2 x for 7 weeks.  Thank you.

## 2020-02-07 NOTE — Telephone Encounter (Signed)
Spoke with Kenney Houseman from [facility] and approved home health teaching and need for home health aide.

## 2020-02-08 ENCOUNTER — Encounter: Payer: Self-pay | Admitting: Surgery

## 2020-02-08 ENCOUNTER — Ambulatory Visit (INDEPENDENT_AMBULATORY_CARE_PROVIDER_SITE_OTHER): Payer: Medicare PPO | Admitting: Surgery

## 2020-02-08 ENCOUNTER — Inpatient Hospital Stay: Payer: Medicare PPO

## 2020-02-08 ENCOUNTER — Other Ambulatory Visit: Payer: Self-pay

## 2020-02-08 VITALS — BP 159/80 | HR 79 | Temp 98.1°F | Resp 12 | Ht 64.0 in | Wt 130.0 lb

## 2020-02-08 DIAGNOSIS — Z933 Colostomy status: Secondary | ICD-10-CM | POA: Insufficient documentation

## 2020-02-08 NOTE — Progress Notes (Signed)
[facility] POST-OP OFFICE VISIT  02/08/2020  HPI: Sherri [name] is a 86 y.o. female 13 days s/p transverse loop colostomy for diversion.  This patient denies any nausea, vomiting, fevers or chills.  She denies pain.  She reports a good appetite.  She notes some rectal pressure, likely residual from distal to her obstruction.  And has not had any bowel movement through the rectum/anus since surgery.  She sees her oncologist in the next 12 days.  Vital signs: BP (!) 159/80   Pulse 79   Temp 98.1 F (36.7 C)   Resp 12   Ht 5\' 4"  (1.626 m)   Wt 130 lb (59 kg)   SpO2 95%   BMI 22.31 kg/m    Physical Exam: Constitutional: She appears nontoxic, alert and oriented.  She maintains a quite sallow pale appearance. Abdomen: Soft and benign.  Right upper quadrant/epigastric stoma present with stool output.  Red Robinson catheter still in place. Skin: Surrounding skin otherwise is clean dry and intact.  Assessment/Plan: This is a 86 y.o. female 13 days s/p diverting loop colostomy.  Patient Active Problem List   Diagnosis Date Noted  . Protein-calorie malnutrition, severe 01/29/2020  . Colonic obstruction ([place]) 01/25/2020  . Palliative care encounter   . SBO (small bowel obstruction) ([place]) 01/24/2020  . Colitis 01/24/2020  . CKD (chronic kidney disease) stage 3, GFR 30-59 ml/min 01/24/2020  . Family history of breast cancer   . Family history of melanoma   . History of breast cancer   . Goals of care, counseling/discussion 01/08/2020  . Ovarian cancer ([place]) 01/05/2020  . Mediastinal lymphadenopathy 11/28/2019  . Bilateral carotid artery stenosis 07/21/2017  . Bradycardia 07/21/2017  . Dizziness 01/18/2017  . Primary cancer of upper inner quadrant of right female breast ([place]) 06/08/2016  . Moderate mitral insufficiency 04/06/2016  . CAD (coronary artery disease) 03/09/2016  . Non-ST elevation myocardial infarction (NSTEMI), subendocardial infarction, subsequent  episode of care ([place]) 03/09/2016  . Age related osteoporosis 01/24/2016  . Benign essential hypertension 01/24/2016  . Breast cancer, right breast ([place]) 01/24/2016  . GERD without esophagitis 01/24/2016  . Pure hypercholesterolemia 01/24/2016  . Vitamin B12 deficiency 01/24/2016    -I decided to defer removal of the red [place] drain for now.  No other issues to address currently.  We will have her back in 2 weeks for likely removal of the red [place] bridge.   Ronny Bacon M.D., FACS 02/08/2020, 11:51 AM

## 2020-02-08 NOTE — Patient Instructions (Signed)
You may use suppositories or an edema to help relieve pressure.   See your appointment below. Call the office if you have any questions or concerns.

## 2020-02-16 ENCOUNTER — Telehealth: Payer: Self-pay | Admitting: Nurse Practitioner

## 2020-02-16 NOTE — Telephone Encounter (Signed)
Spoke with niece, Fuller Canada, regarding Palliative services and after explaining this she was in agreement with scheduling a visit.  I have scheduled an In-person Consult for 02/28/20 @ 8:30 AM.

## 2020-02-16 NOTE — Telephone Encounter (Signed)
Spoke with patient's brother, Jola Baptist, regarding Palliative services and scheduling a visit with NP and he requested that I contact his daughter, Fuller Canada, who handles things like this.  Called Gina to schedule Palliative visit, no answer - left message with reason for call along with my name and contact number.

## 2020-02-18 NOTE — Progress Notes (Signed)
  [name]  Telephone:([phone]) [phone] Fax:(336) [phone]  ID: Sherri [name] OB: [birth year]  MR#: [id number]  LTV#:[id number]  Patient Care Team: [name], MD as PCP - General (Family Medicine) Lloyd Huger, MD as Consulting Physician (Oncology) Clent Jacks, RN as Oncology Nurse Navigator    Lloyd Huger, MD   02/21/2020 4:26 PM      This encounter was created in error - please disregard.

## 2020-02-20 ENCOUNTER — Inpatient Hospital Stay: Payer: Medicare PPO | Admitting: Hospice and Palliative Medicine

## 2020-02-20 ENCOUNTER — Inpatient Hospital Stay: Payer: Medicare PPO

## 2020-02-20 ENCOUNTER — Ambulatory Visit (INDEPENDENT_AMBULATORY_CARE_PROVIDER_SITE_OTHER): Payer: Medicare PPO | Admitting: Surgery

## 2020-02-20 ENCOUNTER — Inpatient Hospital Stay: Payer: Medicare PPO | Admitting: Oncology

## 2020-02-20 ENCOUNTER — Other Ambulatory Visit: Payer: Self-pay

## 2020-02-20 ENCOUNTER — Encounter: Payer: Self-pay | Admitting: Surgery

## 2020-02-20 VITALS — BP 148/79 | HR 78 | Temp 98.3°F | Ht 64.0 in | Wt 128.0 lb

## 2020-02-20 DIAGNOSIS — Z933 Colostomy status: Secondary | ICD-10-CM

## 2020-02-20 NOTE — Progress Notes (Signed)
Sherri [name] presents today, and I understand they have had an issue with obtaining the right colostomy appliances.  Apparently there is a supply chain issue here as well and have not been able to obtain refills of the correct appliance for use.  We have even borrowed from someone else.  She presents here today to have her red Quentin Cornwall catheter removed.  Reluctant to proceed today due to the rather lack of certainty we can replace her appliance.  Hoping to get them within the next day or 2.  So I will defer removing her red Quentin Cornwall bridge for now.  We will see her back when she has adequate appliances available.

## 2020-02-20 NOTE — Patient Instructions (Addendum)
You may wash out your bag with tap water.   Follow up here in 1-2 weeks. Please bring your supplies with you when you come.

## 2020-02-21 ENCOUNTER — Encounter: Payer: Self-pay | Admitting: Oncology

## 2020-02-21 ENCOUNTER — Inpatient Hospital Stay: Payer: Medicare PPO

## 2020-02-21 ENCOUNTER — Inpatient Hospital Stay (HOSPITAL_BASED_OUTPATIENT_CLINIC_OR_DEPARTMENT_OTHER): Payer: Medicare PPO | Admitting: Oncology

## 2020-02-21 ENCOUNTER — Inpatient Hospital Stay (HOSPITAL_BASED_OUTPATIENT_CLINIC_OR_DEPARTMENT_OTHER): Payer: Medicare PPO | Admitting: Hospice and Palliative Medicine

## 2020-02-21 DIAGNOSIS — C569 Malignant neoplasm of unspecified ovary: Secondary | ICD-10-CM | POA: Diagnosis not present

## 2020-02-21 DIAGNOSIS — C561 Malignant neoplasm of right ovary: Secondary | ICD-10-CM | POA: Diagnosis not present

## 2020-02-21 DIAGNOSIS — Z515 Encounter for palliative care: Secondary | ICD-10-CM

## 2020-02-21 LAB — CBC WITH DIFFERENTIAL/PLATELET
Abs Immature Granulocytes: 0.03 10*3/uL (ref 0.00–0.07)
Basophils Absolute: 0.1 10*3/uL (ref 0.0–0.1)
Basophils Relative: 1 %
Eosinophils Absolute: 0.3 10*3/uL (ref 0.0–0.5)
Eosinophils Relative: 4 %
HCT: 33 % — ABNORMAL LOW (ref 36.0–46.0)
Hemoglobin: 10.5 g/dL — ABNORMAL LOW (ref 12.0–15.0)
Immature Granulocytes: 0 %
Lymphocytes Relative: 34 %
Lymphs Abs: 2.8 10*3/uL (ref 0.7–4.0)
MCH: 30.4 pg (ref 26.0–34.0)
MCHC: 31.8 g/dL (ref 30.0–36.0)
MCV: 95.7 fL (ref 80.0–100.0)
Monocytes Absolute: 0.7 10*3/uL (ref 0.1–1.0)
Monocytes Relative: 9 %
Neutro Abs: 4.3 10*3/uL (ref 1.7–7.7)
Neutrophils Relative %: 52 %
Platelets: 236 10*3/uL (ref 150–400)
RBC: 3.45 MIL/uL — ABNORMAL LOW (ref 3.87–5.11)
RDW: 15.9 % — ABNORMAL HIGH (ref 11.5–15.5)
WBC: 8.3 10*3/uL (ref 4.0–10.5)
nRBC: 0 % (ref 0.0–0.2)

## 2020-02-21 LAB — COMPREHENSIVE METABOLIC PANEL
ALT: 23 U/L (ref 0–44)
AST: 29 U/L (ref 15–41)
Albumin: 3.1 g/dL — ABNORMAL LOW (ref 3.5–5.0)
Alkaline Phosphatase: 68 U/L (ref 38–126)
Anion gap: 8 (ref 5–15)
BUN: 16 mg/dL (ref 8–23)
CO2: 29 mmol/L (ref 22–32)
Calcium: 8.2 mg/dL — ABNORMAL LOW (ref 8.9–10.3)
Chloride: 101 mmol/L (ref 98–111)
Creatinine, Ser: 1.02 mg/dL — ABNORMAL HIGH (ref 0.44–1.00)
GFR calc Af Amer: 58 mL/min — ABNORMAL LOW (ref >60)
GFR calc non Af Amer: 50 mL/min — ABNORMAL LOW (ref >60)
Glucose, Bld: 131 mg/dL — ABNORMAL HIGH (ref 70–99)
Potassium: 4.4 mmol/L (ref 3.5–5.1)
Sodium: 138 mmol/L (ref 135–145)
Total Bilirubin: 0.5 mg/dL (ref 0.3–1.2)
Total Protein: 6.4 g/dL — ABNORMAL LOW (ref 6.5–8.1)

## 2020-02-21 NOTE — Progress Notes (Signed)
[place]  Telephone:([phone] [phone]   Name: Sherri [name] Date: 02/21/2020 MRN: [id number]  DOB: 1932/10/20  Patient Care Team: [name], MD as PCP - General (Family Medicine) Lloyd Huger, MD as Consulting Physician (Oncology) Clent Jacks, RN as Oncology Nurse Navigator    REASON FOR CONSULTATION: Sherri [name] is a 86 y.o. female with multiple medical problems including history of stage IIb breast cancer status post lumpectomy and XRT with completion of 5 years of Arimidex in October 2019. She was subsequently diagnosed with stage IV ovarian cancer in June 2021. PET scan on 12/11/2019 revealed diffuse pulmonary metastatic disease with extensive retroperitoneal lymphadenopathy and right ureter obstructed by her pelvic mass with right-sided hydronephrosis. Currently on systemic chemotherapy.  Patient was hospitalized 01/24/2020-01/30/2020 with progressive abdominal pain and distention.  CT of abdomen and pelvis revealed colonic obstruction secondary to her large right pelvic malignancy.  She is s/p diverting loop colostomy on 8/27 for palliation.  Palliative care was consulted to help address goals and manage ongoing symptoms.   SOCIAL HISTORY:     reports that she quit smoking about 64 years ago. Her smoking use included cigarettes. She has a 0.50 pack-year smoking history. She has never used smokeless tobacco. She reports current alcohol use. She reports that she does not use drugs.  Patient is divorced. She lives at home with her brother. She has a son in [place] and another son and daughter who live out of state. Patient has a nephew who is very involved in her care.  Patient previously worked as a Educational psychologist.  ADVANCE DIRECTIVES:  Does not have  CODE STATUS: DNR  PAST MEDICAL HISTORY: Past Medical History:  Diagnosis Date   Anxiety    Arthritis    Breast cancer ([place]) 2013    RIGHT lumpectomy   Bursitis of elbow    RIGHT   Cancer ([place]) 2013   right breast lumpectomy with a few nodes removed   Chronic kidney disease    stage 3   Colitis    Coronary artery disease    Diverticulitis    Dysrhythmia    according to anxiety, tachy   Family history of breast cancer    Family history of melanoma    GERD (gastroesophageal reflux disease)    okay since took her gallbladder out   H/O lumpectomy    Heart disease    History of breast cancer    History of coronary artery stent placement 2007   one stent   HOH (hard of hearing)    Hypercholesteremia    Hypertension    Hypothyroidism    Myocardial infarction [facility])    2007   Osteopenia    Palpitations    Personal history of radiation therapy    Skin cancer     PAST SURGICAL HISTORY:  Past Surgical History:  Procedure Laterality Date   BREAST LUMPECTOMY Right 2013   lymph node dissection   CATARACT EXTRACTION W/PHACO Right 07/23/2015   Procedure: CATARACT EXTRACTION PHACO AND INTRAOCULAR LENS PLACEMENT (IOC);  Surgeon: Birder Robson, MD;  Location: [facility] ORS;  Service: Ophthalmology;  Laterality: Right;  [place] 01:24 AP% 26.7 CDE 22.47 fluid pack lot # [id number]   CATARACT EXTRACTION W/PHACO Left 08/13/2015   Procedure: CATARACT EXTRACTION PHACO AND INTRAOCULAR LENS PLACEMENT (IOC);  Surgeon: Birder Robson, MD;  Location: [facility] OR[facility];  Service: Ophthalmology;  Laterality: Left;  [place] 02:40 AP% 30.7 CDE 49.28 fluid pack lot #  lot # [id number]   CHOLECYSTECTOMY     COLONOSCOPY     CORONARY ANGIOPLASTY  2007   EYE SURGERY     bilateral cataract   HARDWARE REMOVAL Right 04/29/2016   Procedure: HARDWARE REMOVAL;  Surgeon: Hessie Knows, MD;  Location: [facility] OR[facility];  Service: Orthopedics;  Laterality: Right;   IR IMAGING GUIDED PORT INSERTION  01/12/2020   ORIF ELBOW FRACTURE Right 10/01/2015   Procedure: OPEN REDUCTION INTERNAL FIXATION (ORIF) ELBOW/OLECRANON FRACTURE;  Surgeon: Hessie Knows,  MD;  Location: [facility] OR[facility];  Service: Orthopedics;  Laterality: Right;   TRANSVERSE LOOP COLOSTOMY N/A 01/26/2020   Procedure: TRANSVERSE LOOP COLOSTOMY;  Surgeon: Ronny Bacon, MD;  Location: [facility];  Service: General;  Laterality: N/A;   VIDEO BRONCHOSCOPY WITH ENDOBRONCHIAL NAVIGATION N/A 01/01/2020   Procedure: VIDEO BRONCHOSCOPY WITH ENDOBRONCHIAL NAVIGATION WITH CELLZIZIO;  Surgeon: Tyler Pita, MD;  Location: [facility] OR[facility];  Service: Pulmonary;  Laterality: N/A;   VIDEO BRONCHOSCOPY WITH ENDOBRONCHIAL ULTRASOUND N/A 01/01/2020   Procedure: VIDEO BRONCHOSCOPY WITH ENDOBRONCHIAL ULTRASOUND;  Surgeon: Tyler Pita, MD;  Location: [facility] OR[facility];  Service: Pulmonary;  Laterality: N/A;    HEMATOLOGY/ONCOLOGY HISTORY:  Oncology History  Ovarian cancer ([place])  01/05/2020 Initial Diagnosis   Ovarian cancer ([place])   01/12/2020 Cancer Staging   Staging form: Ovary, Fallopian Tube, and Primary Peritoneal Carcinoma, AJCC 8th Edition - Clinical: FIGO Stage IVB (cTX, cN1b, pM1b) - Signed by Lloyd Huger, MD on 01/12/2020   01/16/2020 -  Chemotherapy   The patient had palonosetron (ALOXI) injection 0.25 mg, 0.25 mg, Intravenous,  Once, 1 of 6 cycles Administration: 0.25 mg (01/16/2020) pegfilgrastim (NEULASTA ONPRO KIT) injection 6 mg, 6 mg, Subcutaneous, Once, 1 of 6 cycles Administration: 6 mg (01/16/2020) CARBOplatin (PARAPLATIN) 210 mg in sodium chloride 0.9 % 250 mL chemo infusion, 210 mg (103.7 % of original dose 204.4 mg), Intravenous,  Once, 1 of 6 cycles Dose modification:   (original dose 204.4 mg, Cycle 1) Administration: 210 mg (01/16/2020) fosaprepitant (EMEND) 150 mg in sodium chloride 0.9 % 145 mL IVPB, 150 mg, Intravenous,  Once, 1 of 6 cycles Administration: 150 mg (01/16/2020) PACLitaxel (TAXOL) 180 mg in sodium chloride 0.9 % 250 mL chemo infusion (> 29m/m2), 110 mg/m2 = 180 mg (100 % of original dose 110 mg/m2), Intravenous,  Once, 1 of 6 cycles Dose modification: 110 mg/m2  (original dose 110 mg/m2, Cycle 1, Reason: Patient Age) Administration: 180 mg (01/16/2020)  for chemotherapy treatment.      ALLERGIES:  is allergic to prednisone.  MEDICATIONS:  Current Outpatient Medications  Medication Sig Dispense Refill   acetaminophen (TYLENOL) 500 MG tablet Take 500 mg by mouth every 8 (eight) hours as needed for moderate pain.      aspirin EC 81 MG tablet Take 81 mg by mouth at bedtime. Swallow whole.      carvedilol (COREG) 3.125 MG tablet Take 3.125 mg by mouth 2 (two) times daily.      Cholecalciferol (VITAMIN D-3 PO) Take 1,000 mg by mouth daily.      Coenzyme Q10 (CO Q 10 PO) Take 100 mg by mouth daily.      feeding supplement, ENSURE ENLIVE, (ENSURE ENLIVE) LIQD Take 237 mLs by mouth 2 (two) times daily between meals. 14220 mL 0   levothyroxine (SYNTHROID) 25 MCG tablet Take 25 mcg by mouth daily before breakfast.     lidocaine-prilocaine (EMLA) cream Apply to affected area once 30 g 3   mirtazapine (REMERON) 7.5 MG tablet Take 7.5  mouth at bedtime.     Multiple Vitamin (MULTIVITAMIN WITH MINERALS) TABS tablet Take 1 tablet by mouth daily. 30 tablet 0   nitroGLYCERIN (NITROSTAT) 0.4 MG SL tablet Place 0.4 mg under the tongue every 5 (five) minutes x 3 doses as needed for chest pain.  (Patient not taking: Reported on 02/21/2020)     pantoprazole (PROTONIX) 20 MG tablet Take 1 tablet (20 mg total) by mouth daily. 30 tablet 1   simvastatin (ZOCOR) 40 MG tablet Take 40 mg by mouth at bedtime.      No current facility-administered medications for this visit.    VITAL SIGNS: There were no vitals taken for this visit. There were no vitals filed for this visit.  Estimated body mass index is 21.97 kg/m as calculated from the following:   Height as of 02/20/20: _0  (1.626 m).   Weight as of 02/20/20: 128 lb (58.1 kg).  LABS: CBC:    Component Value Date/Time   WBC 8.3 02/21/2020 1000   HGB 10.5 (L) 02/21/2020 1000   HCT 33.0 (L)  02/21/2020 1000   PLT 236 02/21/2020 1000   MCV 95.7 02/21/2020 1000   NEUTROABS 4.3 02/21/2020 1000   LYMPHSABS 2.8 02/21/2020 1000   MONOABS 0.7 02/21/2020 1000   EOSABS 0.3 02/21/2020 1000   BASOSABS 0.1 02/21/2020 1000   Comprehensive Metabolic Panel:    Component Value Date/Time   NA 138 02/21/2020 1000   K 4.4 02/21/2020 1000   CL 101 02/21/2020 1000   CO2 29 02/21/2020 1000   BUN 16 02/21/2020 1000   CREATININE 1.02 (H) 02/21/2020 1000   GLUCOSE 131 (H) 02/21/2020 1000   CALCIUM 8.2 (L) 02/21/2020 1000   AST 29 02/21/2020 1000   ALT 23 02/21/2020 1000   ALKPHOS 68 02/21/2020 1000   BILITOT 0.5 02/21/2020 1000   PROT 6.4 (L) 02/21/2020 1000   ALBUMIN 3.1 (L) 02/21/2020 1000    RADIOGRAPHIC STUDIES: CT ABDOMEN PELVIS WO CONTRAST  Result Date: 01/24/2020 CLINICAL DATA:  Abdominal pain, acute nonlocalized in the setting of metastatic ovarian cancer EXAM: CT ABDOMEN AND PELVIS WITHOUT CONTRAST TECHNIQUE: Multidetector CT imaging of the abdomen and pelvis was performed following the standard protocol without IV contrast. COMPARISON:  Chest CT of November 16, 2019 FINDINGS: Lower chest: Signs of pulmonary metastatic disease and interstitial lung disease. No effusion. Lingular nodule measuring approximately 1.4 cm previously 1.4-1.5 cm. The disease in the lung bases grossly unchanged. Upper lobes not assessed Hepatobiliary: Interval development of ascites since the prior study. No focal, suspicious hepatic lesion. Fluid is mainly about the liver. Pancreas: Pancreas without ductal dilation, inflammation or focal lesion. Spleen: Spleen normal size with lobulated contours. Adrenals/Urinary Tract: Adrenal glands are normal. Marked RIGHT-sided hydroureteronephrosis with chronic appearing RIGHT-sided cortical thinning. RIGHT ureteral involvement from RIGHT pelvic soft tissue mass (image 66, series 2) 4.9 x 3.2 cm, contiguous with RIGHT adnexa and inseparable from with may represent the uterus but  is obscured by soft tissue. Distortion also at the RIGHT bladder base on image 77 of series 2. No LEFT-sided hydronephrosis. Cyst in the LEFT kidney. Stomach/Bowel: Distal colonic obstruction with sigmoid narrowing best seen on image 67 of series 2. Irregular margins of the sigmoid colon at this level. Process results in small bowel dilation. There is a large duodenal diverticulum. Pericolonic stranding throughout. Some nodularity of lateral conal fascia and fascial planes in the abdomen. Omental nodularity seen anterior to the transverse colon on image 52 of series 2. No free  air. Tethering of small bowel loops in the pelvis. Small hiatal hernia. Vascular/Lymphatic: Calcified atheromatous plaque of the abdominal aorta. Gastrohepatic adenopathy is suspected. Soft tissue may also be present about the porta hepatis. No aneurysmal dilation. Pelvic mass encases the RIGHT ureter and external iliac vasculature on the RIGHT. Reproductive: Mass in the RIGHT hemipelvis inseparable from remaining pelvic viscera. Also tethering the upper portion of the urinary bladder and RIGHT bladder wall. Other: Small volume ascites mainly about the liver. Musculoskeletal: Spinal degenerative changes. No acute or destructive bone finding. IMPRESSION: 1. Distal colonic obstruction associated with colitis presumably due to metastatic disease to the surface of the sigmoid colon. Signs of colitis with bowel distension of the colon up to 8 cm. GI and or surgical consultation may be helpful. 2. Mass in the RIGHT hemipelvis obstructs the RIGHT ureter. Chronic thinning of the RIGHT renal cortex in the setting of severe RIGHT-sided hydro ureteral nephrosis. Mass also involves the RIGHT urinary bladder, the uterus and tethers small-bowel loops in the pelvis and adjacent sigmoid colon. 3. Extension of mass into the RIGHT pelvic sidewall as described. 4. Findings that suggest more diffuse peritoneal involvement. A component of the ascitic fluid could  also be due to colonic inflammation. 5. Evidence of pulmonary metastatic disease and interstitial lung disease. 6. Signs of pulmonary metastatic disease and interstitial lung disease. 7. Aortic atherosclerosis. Aortic Atherosclerosis (ICD10-I70.0). Electronically Signed   By: Zetta Bills M.D.   On: 01/24/2020 14:28   US Abdomen Limited  Result Date: 01/23/2020 CLINICAL DATA:  Ovarian cancer. Patient with abdominal distension. Evaluate for ascites. EXAM: LIMITED ABDOMEN ULTRASOUND FOR ASCITES TECHNIQUE: Limited ultrasound survey for ascites was performed in all four abdominal quadrants. COMPARISON:  PET-CT 12/11/2019 FINDINGS: No significant ascites. Marked dilatation of the right renal collecting system appears chronic and similar to the recent PET-CT. IMPRESSION: No evidence for ascites. Chronic right hydronephrosis. Electronically Signed   By: Markus Daft M.D.   On: 01/23/2020 15:48   DG ABD ACUTE 2+V W 1V CHEST  Result Date: 01/25/2020 CLINICAL DATA:  Colonic obstruction EXAM: DG ABDOMEN ACUTE W/ 1V CHEST COMPARISON:  Abdominal CT from yesterday FINDINGS: Low colonic obstruction. A loop in the central abdomen measures up to 7 cm in diameter, essentially stable from scanogram yesterday. No evidence of extraluminal gas collection. Reticulonodular opacities in the bilateral lungs. There is metastatic disease and pulmonary fibrosis at the lung bases by prior CT. Normal heart size. No visible effusion or pneumothorax. IMPRESSION: 1. Low colonic obstruction associated with malignancy. The degree of distension is similar to CT from yesterday. 2. Known pulmonary metastases with superimposed interstitial lung disease. Electronically Signed   By: Monte Fantasia M.D.   On: 01/25/2020 06:36   DG Abd Portable 1V  Result Date: 01/26/2020 CLINICAL DATA:  Colonic obstruction. EXAM: PORTABLE ABDOMEN - 1 VIEW COMPARISON:  Abdomen 01/25/2020.  CT 01/24/2020. FINDINGS: Persistent diffuse colonic and small bowel  distention without interim change from prior exam. Findings consistent with distal colonic obstruction as noted previously on CT of 01/24/2020. No pneumatosis. No free air noted. Diffuse osteopenia. Degenerative changes lumbar spine and both hips. IMPRESSION: Persistent diffuse colonic and small bowel distention without interim change from prior exam. Findings consistent with distal colonic obstruction as noted on prior CT of 01/24/2020. Electronically Signed   By: [place]   On: 01/26/2020 05:54    PERFORMANCE STATUS (ECOG) : 2 - Symptomatic, <50% confined to bed  Review of Systems Unless otherwise noted, a complete  review of systems is negative.  Physical Exam General: NAD, frail appearing Pulmonary: Unlabored Extremities: no edema, no joint deformities Skin: no rashes Neurological: Weakness but otherwise nonfocal  IMPRESSION: Routine follow-up visit.  Patient was accompanied by her nephew.  Patient reports that she is doing better at home.  She continues work with PT/OT.  She says she is ambulating around the house without difficulty and without reported falls.  She says that her appetite has improved.  She denies any symptomatic complaints today.  Patient has continued to have difficulty with management of her ostomy and obtaining supplies in the home.  She has had frequent leakage.  Patient saw the surgeon yesterday.  We will see if we can get her some supplies from the hospital while she waits for an supplies to arrive from home health.  We discussed general goals.  Although, patient feels like she is doing better, she recognizes that initiation of chemotherapy could result in a decline in functioning or recurrent hospitalization.  Despite understanding, patient is interested in pursuing treatment.  Discussed with Dr. [name] and patient will return in 2 weeks with tentative plan to initiate chemotherapy.  PLAN: -Continue current scope of treatment -RTC in 2 weeks  Case and  plan discussed with Dr. [name]  Patient expressed understanding and was in agreement with this plan. She also understands that She can call the clinic at any time with any questions, concerns, or complaints.     Time Total: 20 minutes  Visit consisted of counseling and education dealing with the complex and emotionally intense issues of symptom management and palliative care in the setting of serious and potentially life-threatening illness.Greater than 50%  of this time was spent counseling and coordinating care related to the above assessment and plan.  Signed by: Altha Harm, PhD, NP-C

## 2020-02-21 NOTE — Progress Notes (Signed)
Pt in for follow up, states feeling better and improved appetite.  Pt also continues to have issues with ostomy leaking.

## 2020-02-22 ENCOUNTER — Telehealth: Payer: Self-pay | Admitting: *Deleted

## 2020-02-22 LAB — CA 125: Cancer Antigen (CA) 125: 1057 U/mL — ABNORMAL HIGH (ref 0.0–38.1)

## 2020-02-22 NOTE — Telephone Encounter (Signed)
[facility] called and wanted to see if we can put in a order for patient to have a Education officer, museum evaluation for long term planning or does she need to go through her primary care physician.   Call [place] at [phone]

## 2020-02-22 NOTE — Telephone Encounter (Signed)
Left detailed message for Colletta [place] with [facility], per [place] to inform [facility] that their facility would have to go through patient's primary care provider.

## 2020-02-23 NOTE — Progress Notes (Signed)
[place]  [phone] [phone]) [phone]  ID: Sherri [name] OB: 03/13/1933  MR#: [id number]  ELF#:[id number]  Patient Care Team: [name], MD as PCP - General (Family Medicine) Lloyd Huger, MD as Consulting Physician (Oncology) Clent Jacks, RN as Oncology Nurse Navigator  CHIEF COMPLAINT: Stage IVb ovarian cancer.  INTERVAL HISTORY: Patient returns to clinic today for hospital follow-up after her surgery for malignant obstruction and discussion of whether or not to continue treatment.  She continues to have significant weakness and fatigue, but states this is improving along with her appetite.  She denies any pain.  She has no neurologic complaints.  She denies any fevers.  She has no chest pain, shortness of breath, cough, or hemoptysis.  She denies any abdominal pain.  She has problems with her colostomy leaking.  She denies any nausea, vomiting, constipation, or diarrhea.  She has no urinary complaints.  Patient offers no further specific complaints today.  REVIEW OF SYSTEMS:   Review of Systems  Constitutional: Positive for malaise/fatigue. Negative for diaphoresis, fever and weight loss.  Respiratory: Negative.  Negative for cough and shortness of breath.   Cardiovascular: Negative.  Negative for chest pain and leg swelling.  Gastrointestinal: Negative.  Negative for abdominal pain, nausea and vomiting.  Genitourinary: Negative.  Negative for dysuria.  Musculoskeletal: Negative.  Negative for joint pain.  Skin: Negative.  Negative for rash.  Neurological: Positive for weakness. Negative for dizziness, sensory change and headaches.  Psychiatric/Behavioral: Negative.  The patient is not nervous/anxious.     As per HPI. Otherwise, a complete review of systems is negative.  PAST MEDICAL HISTORY: Past Medical History:  Diagnosis Date   Anxiety    Arthritis    Breast cancer ([place]) 2013   RIGHT lumpectomy   Bursitis of  elbow    RIGHT   Cancer [facility]) 2013   right breast lumpectomy with a few nodes removed   Chronic kidney disease    stage 3   Colitis    Coronary artery disease    Diverticulitis    Dysrhythmia    according to anxiety, tachy   Family history of breast cancer    Family history of melanoma    GERD (gastroesophageal reflux disease)    okay since took her gallbladder out   H/O lumpectomy    Heart disease    History of breast cancer    History of coronary artery stent placement 2007   one stent   HOH (hard of hearing)    Hypercholesteremia    Hypertension    Hypothyroidism    Myocardial infarction [facility])    2007   Osteopenia    Palpitations    Personal history of radiation therapy    Skin cancer     PAST SURGICAL HISTORY: Past Surgical History:  Procedure Laterality Date   BREAST LUMPECTOMY Right 2013   lymph node dissection   CATARACT EXTRACTION W/PHACO Right 07/23/2015   Procedure: CATARACT EXTRACTION PHACO AND INTRAOCULAR LENS PLACEMENT (IOC);  Surgeon: Birder Robson, MD;  Location: [facility] ORS;  Service: Ophthalmology;  Laterality: Right;  [place] 01:24 AP% 26.7 CDE 22.47 fluid pack lot # [id number]   CATARACT EXTRACTION W/PHACO Left 08/13/2015   Procedure: CATARACT EXTRACTION PHACO AND INTRAOCULAR LENS PLACEMENT (IOC);  Surgeon: Birder Robson, MD;  Location: [facility] ORS;  Service: Ophthalmology;  Laterality: Left;  [place] 02:40 AP% 30.7 CDE 49.28 fluid pack lot # [id number]   CHOLECYSTECTOMY     COLONOSCOPY  CORONARY ANGIOPLASTY  2007   EYE SURGERY     bilateral cataract   HARDWARE REMOVAL Right 04/29/2016   Procedure: HARDWARE REMOVAL;  Surgeon: Hessie Knows, MD;  Location: [facility] ORS;  Service: Orthopedics;  Laterality: Right;   IR IMAGING GUIDED PORT INSERTION  01/12/2020   ORIF ELBOW FRACTURE Right 10/01/2015   Procedure: OPEN REDUCTION INTERNAL FIXATION (ORIF) ELBOW/OLECRANON FRACTURE;  Surgeon: Hessie Knows, MD;  Location: [facility] ORS;  Service:  Orthopedics;  Laterality: Right;   TRANSVERSE LOOP COLOSTOMY N/A 01/26/2020   Procedure: TRANSVERSE LOOP COLOSTOMY;  Surgeon: Ronny Bacon, MD;  Location: [facility] OR[facility];  Service: General;  Laterality: N/A;   VIDEO BRONCHOSCOPY WITH ENDOBRONCHIAL NAVIGATION N/A 01/01/2020   Procedure: VIDEO BRONCHOSCOPY WITH ENDOBRONCHIAL NAVIGATION WITH CELLZIZIO;  Surgeon: Tyler Pita, MD;  Location: [facility] ORS;  Service: Pulmonary;  Laterality: N/A;   VIDEO BRONCHOSCOPY WITH ENDOBRONCHIAL ULTRASOUND N/A 01/01/2020   Procedure: VIDEO BRONCHOSCOPY WITH ENDOBRONCHIAL ULTRASOUND;  Surgeon: Tyler Pita, MD;  Location: [facility] ORS;  Service: Pulmonary;  Laterality: N/A;    FAMILY HISTORY: Reviewed and unchanged. No reported history of malignancy or chronic disease.     ADVANCED DIRECTIVES:    HEALTH MAINTENANCE: Social History   Tobacco Use   Smoking status: Former Smoker    Packs/day: 0.25    Years: 2.00    Pack years: 0.50    Types: Cigarettes    Quit date: 12/12/1955    Years since quitting: 64.2   Smokeless tobacco: Never Used  Vaping Use   Vaping Use: Never used  Substance Use Topics   Alcohol use: Yes    Comment: wine occasionally   Drug use: No     Colonoscopy:  PAP:  Bone density:  Lipid panel:  Allergies  Allergen Reactions   Prednisone Other (See Comments)    Mood alterations    Current Outpatient Medications  Medication Sig Dispense Refill   acetaminophen (TYLENOL) 500 MG tablet Take 500 mg by mouth every 8 (eight) hours as needed for moderate pain.      aspirin EC 81 MG tablet Take 81 mg by mouth at bedtime. Swallow whole.      carvedilol (COREG) 3.125 MG tablet Take 3.125 mg by mouth 2 (two) times daily.      Cholecalciferol (VITAMIN D-3 PO) Take 1,000 mg by mouth daily.      Coenzyme Q10 (CO Q 10 PO) Take 100 mg by mouth daily.      feeding supplement, ENSURE ENLIVE, (ENSURE ENLIVE) LIQD Take 237 mLs by mouth 2 (two) times daily between meals. 14220 mL  0   levothyroxine (SYNTHROID) 25 MCG tablet Take 25 mcg by mouth daily before breakfast.     lidocaine-prilocaine (EMLA) cream Apply to affected area once 30 g 3   mirtazapine (REMERON) 7.5 MG tablet Take 7.5 mg by mouth at bedtime.     Multiple Vitamin (MULTIVITAMIN WITH MINERALS) TABS tablet Take 1 tablet by mouth daily. 30 tablet 0   pantoprazole (PROTONIX) 20 MG tablet Take 1 tablet (20 mg total) by mouth daily. 30 tablet 1   simvastatin (ZOCOR) 40 MG tablet Take 40 mg by mouth at bedtime.      nitroGLYCERIN (NITROSTAT) 0.4 MG SL tablet Place 0.4 mg under the tongue every 5 (five) minutes x 3 doses as needed for chest pain.  (Patient not taking: Reported on 02/21/2020)     No current facility-administered medications for this visit.    OBJECTIVE: There were no vitals filed for this visit.  There is no height or weight on file to calculate BMI.    ECOG FS:2 - Symptomatic, <50% confined to bed  General: Thin, no acute distress.  Sitting in a wheelchair. Eyes: Pink conjunctiva, anicteric sclera. HEENT: Normocephalic, moist mucous membranes. Lungs: No audible wheezing or coughing. Heart: Regular rate and rhythm. Abdomen: Soft, nontender, no obvious distention. Musculoskeletal: No edema, cyanosis, or clubbing. Neuro: Alert, answering all questions appropriately. Cranial nerves grossly intact. Skin: No rashes or petechiae noted. Psych: Normal affect.  LAB RESULTS:  Lab Results  Component Value Date   NA 138 02/21/2020   K 4.4 02/21/2020   CL 101 02/21/2020   CO2 29 02/21/2020   GLUCOSE 131 (H) 02/21/2020   BUN 16 02/21/2020   CREATININE 1.02 (H) 02/21/2020   CALCIUM 8.2 (L) 02/21/2020   PROT 6.4 (L) 02/21/2020   ALBUMIN 3.1 (L) 02/21/2020   AST 29 02/21/2020   ALT 23 02/21/2020   ALKPHOS 68 02/21/2020   BILITOT 0.5 02/21/2020   GFRNONAA 50 (L) 02/21/2020   GFRAA 58 (L) 02/21/2020    Lab Results  Component Value Date   WBC 8.3 02/21/2020   NEUTROABS 4.3  02/21/2020   HGB 10.5 (L) 02/21/2020   HCT 33.0 (L) 02/21/2020   MCV 95.7 02/21/2020   PLT 236 02/21/2020     STUDIES: CT ABDOMEN PELVIS WO CONTRAST  Result Date: 01/24/2020 CLINICAL DATA:  Abdominal pain, acute nonlocalized in the setting of metastatic ovarian cancer EXAM: CT ABDOMEN AND PELVIS WITHOUT CONTRAST TECHNIQUE: Multidetector CT imaging of the abdomen and pelvis was performed following the standard protocol without IV contrast. COMPARISON:  Chest CT of November 16, 2019 FINDINGS: Lower chest: Signs of pulmonary metastatic disease and interstitial lung disease. No effusion. Lingular nodule measuring approximately 1.4 cm previously 1.4-1.5 cm. The disease in the lung bases grossly unchanged. Upper lobes not assessed Hepatobiliary: Interval development of ascites since the prior study. No focal, suspicious hepatic lesion. Fluid is mainly about the liver. Pancreas: Pancreas without ductal dilation, inflammation or focal lesion. Spleen: Spleen normal size with lobulated contours. Adrenals/Urinary Tract: Adrenal glands are normal. Marked RIGHT-sided hydroureteronephrosis with chronic appearing RIGHT-sided cortical thinning. RIGHT ureteral involvement from RIGHT pelvic soft tissue mass (image 66, series 2) 4.9 x 3.2 cm, contiguous with RIGHT adnexa and inseparable from with may represent the uterus but is obscured by soft tissue. Distortion also at the RIGHT bladder base on image 77 of series 2. No LEFT-sided hydronephrosis. Cyst in the LEFT kidney. Stomach/Bowel: Distal colonic obstruction with sigmoid narrowing best seen on image 67 of series 2. Irregular margins of the sigmoid colon at this level. Process results in small bowel dilation. There is a large duodenal diverticulum. Pericolonic stranding throughout. Some nodularity of lateral conal fascia and fascial planes in the abdomen. Omental nodularity seen anterior to the transverse colon on image 52 of series 2. No free air. Tethering of small bowel  loops in the pelvis. Small hiatal hernia. Vascular/Lymphatic: Calcified atheromatous plaque of the abdominal aorta. Gastrohepatic adenopathy is suspected. Soft tissue may also be present about the porta hepatis. No aneurysmal dilation. Pelvic mass encases the RIGHT ureter and external iliac vasculature on the RIGHT. Reproductive: Mass in the RIGHT hemipelvis inseparable from remaining pelvic viscera. Also tethering the upper portion of the urinary bladder and RIGHT bladder wall. Other: Small volume ascites mainly about the liver. Musculoskeletal: Spinal degenerative changes. No acute or destructive bone finding. IMPRESSION: 1. Distal colonic obstruction associated with colitis presumably due to metastatic disease  to the surface of the sigmoid colon. Signs of colitis with bowel distension of the colon up to 8 cm. GI and or surgical consultation may be helpful. 2. Mass in the RIGHT hemipelvis obstructs the RIGHT ureter. Chronic thinning of the RIGHT renal cortex in the setting of severe RIGHT-sided hydro ureteral nephrosis. Mass also involves the RIGHT urinary bladder, the uterus and tethers small-bowel loops in the pelvis and adjacent sigmoid colon. 3. Extension of mass into the RIGHT pelvic sidewall as described. 4. Findings that suggest more diffuse peritoneal involvement. A component of the ascitic fluid could also be due to colonic inflammation. 5. Evidence of pulmonary metastatic disease and interstitial lung disease. 6. Signs of pulmonary metastatic disease and interstitial lung disease. 7. Aortic atherosclerosis. Aortic Atherosclerosis (ICD10-I70.0). Electronically Signed   By: Zetta Bills M.D.   On: 01/24/2020 14:28   DG ABD ACUTE 2+V W 1V CHEST  Result Date: 01/25/2020 CLINICAL DATA:  Colonic obstruction EXAM: DG ABDOMEN ACUTE W/ 1V CHEST COMPARISON:  Abdominal CT from yesterday FINDINGS: Low colonic obstruction. A loop in the central abdomen measures up to 7 cm in diameter, essentially stable from  scanogram yesterday. No evidence of extraluminal gas collection. Reticulonodular opacities in the bilateral lungs. There is metastatic disease and pulmonary fibrosis at the lung bases by prior CT. Normal heart size. No visible effusion or pneumothorax. IMPRESSION: 1. Low colonic obstruction associated with malignancy. The degree of distension is similar to CT from yesterday. 2. Known pulmonary metastases with superimposed interstitial lung disease. Electronically Signed   By: Monte Fantasia M.D.   On: 01/25/2020 06:36   DG Abd Portable 1V  Result Date: 01/26/2020 CLINICAL DATA:  Colonic obstruction. EXAM: PORTABLE ABDOMEN - 1 VIEW COMPARISON:  Abdomen 01/25/2020.  CT 01/24/2020. FINDINGS: Persistent diffuse colonic and small bowel distention without interim change from prior exam. Findings consistent with distal colonic obstruction as noted previously on CT of 01/24/2020. No pneumatosis. No free air noted. Diffuse osteopenia. Degenerative changes lumbar spine and both hips. IMPRESSION: Persistent diffuse colonic and small bowel distention without interim change from prior exam. Findings consistent with distal colonic obstruction as noted on prior CT of 01/24/2020. Electronically Signed   By: Marcello Moores  Register   On: 01/26/2020 05:54    ASSESSMENT: Stage IVb ovarian cancer.  PLAN:    1.  Stage IVb ovarian cancer: Despite history of breast cancer, biopsy consistent with carcinoma of gynecologic origin.  PET scan results from December 11, 2019 reviewed independently with large right pelvic region, extensive retroperitoneal lymphadenopathy and bilateral pulmonary disease consistent with metastatic ovarian cancer.  Appreciate gynecology oncology input.  Patient only received 1 dose of dose reduced carboplatinum and Taxol on January 16, 2020 before requiring surgery for malignant obstruction related to her malignancy.  Despite not having treatment since then, patient's CA-125 has remained essentially stable at 1057.   Although she has increased weakness and fatigue and decreased performance status, patient states she is improving and wishes to retry chemotherapy.  Hospice was discussed, but patient is not interested at this time.  Return to clinic in 2 weeks for further evaluation and reconsideration of dose reduced carboplatinum, Taxol, and OnPro-Neulasta support.  Appreciate palliative care input.   2.  History of breast cancer: Patient has a history of stage IIb ER/PR positive, upper inner quadrant right breast.  Previously we did not have access to patient's pathology report, her stage and location of breast were determined based on clinic notes.  She received 6 months of  Arimidex between April 2013 and October 2013 prior to undergoing right breast lumpectomy. She reports she underwent adjuvant XRT, but did not receive adjuvant chemotherapy.  Patient completed 5 years of Arimidex in October 2018.  Her most recent mammogram on January 30, 2019 was reported as BI-RADS 1.   3.  Anxiety: Patient receives IV Ativan with patient's premeds for each treatment.  Continue Xanax as needed at home. 4.  Diverting colostomy: Continue home health care and follow-up with surgery as indicated. 5.  Leukocytosis: Resolved. 6.  Anemia: Chronic and unchanged.  Patient expressed understanding and was in agreement with this plan. She also understands that She can call clinic at any time with any questions, concerns, or complaints.    Lloyd Huger, MD   02/23/2020 6:48 AM

## 2020-02-27 ENCOUNTER — Telehealth: Payer: Self-pay | Admitting: Licensed Clinical Social Worker

## 2020-02-27 ENCOUNTER — Other Ambulatory Visit: Payer: Self-pay

## 2020-02-27 ENCOUNTER — Ambulatory Visit (INDEPENDENT_AMBULATORY_CARE_PROVIDER_SITE_OTHER): Payer: Medicare PPO | Admitting: Surgery

## 2020-02-27 ENCOUNTER — Encounter: Payer: Self-pay | Admitting: Surgery

## 2020-02-27 VITALS — BP 175/91 | HR 97 | Temp 98.0°F | Resp 14 | Wt 128.0 lb

## 2020-02-27 DIAGNOSIS — Z933 Colostomy status: Secondary | ICD-10-CM

## 2020-02-27 NOTE — Patient Instructions (Signed)
Keep your appointment with Dr [name] next week.   Follow up as needed with Dr Christian Mate. Call the office if you have any questions or concerns.

## 2020-02-27 NOTE — Progress Notes (Signed)
This pleasant lady returns today having obtain the stomal appliances necessary to intervene and remove her red [place] bridge from her loop transverse colostomy. This loop was removed and the bag attached successfully without issue. Hopefully her appliance will fit better without the interruption of that bridge.  She reports her appetite has improved.  She denies pain fever, chills, nausea or vomiting.  She follows up with Dr. [name] next week, for consideration of resuming chemotherapy.  We will be glad to see her as needed to assist in any way.

## 2020-02-28 ENCOUNTER — Encounter: Payer: Self-pay | Admitting: Nurse Practitioner

## 2020-02-28 ENCOUNTER — Other Ambulatory Visit: Payer: Medicare PPO | Admitting: Nurse Practitioner

## 2020-02-28 ENCOUNTER — Other Ambulatory Visit: Payer: Self-pay | Admitting: *Deleted

## 2020-02-28 ENCOUNTER — Other Ambulatory Visit: Payer: Self-pay

## 2020-02-28 DIAGNOSIS — C561 Malignant neoplasm of right ovary: Secondary | ICD-10-CM

## 2020-02-28 DIAGNOSIS — Z933 Colostomy status: Secondary | ICD-10-CM

## 2020-02-28 DIAGNOSIS — C569 Malignant neoplasm of unspecified ovary: Secondary | ICD-10-CM

## 2020-02-28 DIAGNOSIS — Z515 Encounter for palliative care: Secondary | ICD-10-CM
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
## 2020-02-29 ENCOUNTER — Encounter: Payer: Medicare PPO | Admitting: Surgery

## 2020-02-29 ENCOUNTER — Inpatient Hospital Stay: Payer: Medicare PPO

## 2020-02-29 NOTE — Progress Notes (Signed)
[facility]  [phone] [phone]  ID: Sherri [name] OB: 09-17-1932  MR#: [id number]  DGU#:[id number]  Patient Care Team: [name], MD as PCP - General (Family Medicine) Lloyd Huger, MD as Consulting Physician (Oncology) Clent Jacks, RN as Oncology Nurse Navigator  CHIEF COMPLAINT: Stage IVb ovarian cancer.  INTERVAL HISTORY: Patient returns to clinic today for further evaluation and reinitiation of dose reduced carboplatin and Taxol.  She continues to have weakness and fatigue, but states she is able to complete ADLs and her strength is improving.  Her appetite is also improving.  She denies any pain.  She has no neurologic complaints.  She denies any fevers.  She has no chest pain, shortness of breath, cough, or hemoptysis.  She denies any abdominal pain.  She continues to have problems with her colostomy.  She denies any nausea, vomiting, constipation, or diarrhea.  She has no urinary complaints.  Patient offers no further specific complaints today.  REVIEW OF SYSTEMS:   Review of Systems  Constitutional: Positive for malaise/fatigue. Negative for diaphoresis, fever and weight loss.  Respiratory: Negative.  Negative for cough and shortness of breath.   Cardiovascular: Negative.  Negative for chest pain and leg swelling.  Gastrointestinal: Negative.  Negative for abdominal pain, nausea and vomiting.  Genitourinary: Negative.  Negative for dysuria.  Musculoskeletal: Negative.  Negative for joint pain.  Skin: Negative.  Negative for rash.  Neurological: Positive for weakness. Negative for dizziness, sensory change and headaches.  Psychiatric/Behavioral: Negative.  The patient is not nervous/anxious.     As per HPI. Otherwise, a complete review of systems is negative.  PAST MEDICAL HISTORY: Past Medical History:  Diagnosis Date  . Anxiety   . Arthritis   . Breast cancer ([place]) 2013   RIGHT lumpectomy  . Bursitis of  elbow    RIGHT  . Cancer [facility]) 2013   right breast lumpectomy with a few nodes removed  . Chronic kidney disease    stage 3  . Colitis   . Coronary artery disease   . Diverticulitis   . Dysrhythmia    according to anxiety, tachy  . Family history of breast cancer   . Family history of melanoma   . GERD (gastroesophageal reflux disease)    okay since took her gallbladder out  . H/O lumpectomy   . Heart disease   . History of breast cancer   . History of coronary artery stent placement 2007   one stent  . HOH (hard of hearing)   . Hypercholesteremia   . Hypertension   . Hypothyroidism   . Myocardial infarction ([place])    2007  . Osteopenia   . Palpitations   . Personal history of radiation therapy   . Skin cancer     PAST SURGICAL HISTORY: Past Surgical History:  Procedure Laterality Date  . BREAST LUMPECTOMY Right 2013   lymph node dissection  . CATARACT EXTRACTION W/PHACO Right 07/23/2015   Procedure: CATARACT EXTRACTION PHACO AND INTRAOCULAR LENS PLACEMENT (IOC);  Surgeon: Birder Robson, MD;  Location: [facility] ORS;  Service: Ophthalmology;  Laterality: Right;  [place] 01:24 AP% 26.7 CDE 22.47 fluid pack lot # [id number]  . CATARACT EXTRACTION W/PHACO Left 08/13/2015   Procedure: CATARACT EXTRACTION PHACO AND INTRAOCULAR LENS PLACEMENT (IOC);  Surgeon: Birder Robson, MD;  Location: [facility] ORS;  Service: Ophthalmology;  Laterality: Left;  [place] 02:40 AP% 30.7 CDE 49.28 fluid pack lot # [id number]  . CHOLECYSTECTOMY    . COLONOSCOPY    .  CORONARY ANGIOPLASTY  2007  . EYE SURGERY     bilateral cataract  . HARDWARE REMOVAL Right 04/29/2016   Procedure: HARDWARE REMOVAL;  Surgeon: Hessie Knows, MD;  Location: [facility] ORS;  Service: Orthopedics;  Laterality: Right;  . IR IMAGING GUIDED PORT INSERTION  01/12/2020  . ORIF ELBOW FRACTURE Right 10/01/2015   Procedure: OPEN REDUCTION INTERNAL FIXATION (ORIF) ELBOW/OLECRANON FRACTURE;  Surgeon: Hessie Knows, MD;  Location: [facility] ORS;  Service:  Orthopedics;  Laterality: Right;  . TRANSVERSE LOOP COLOSTOMY N/A 01/26/2020   Procedure: TRANSVERSE LOOP COLOSTOMY;  Surgeon: Ronny Bacon, MD;  Location: [facility] ORS;  Service: General;  Laterality: N/A;  . VIDEO BRONCHOSCOPY WITH ENDOBRONCHIAL NAVIGATION N/A 01/01/2020   Procedure: VIDEO BRONCHOSCOPY WITH ENDOBRONCHIAL NAVIGATION WITH CELLZIZIO;  Surgeon: Tyler Pita, MD;  Location: [facility] ORS;  Service: Pulmonary;  Laterality: N/A;  . VIDEO BRONCHOSCOPY WITH ENDOBRONCHIAL ULTRASOUND N/A 01/01/2020   Procedure: VIDEO BRONCHOSCOPY WITH ENDOBRONCHIAL ULTRASOUND;  Surgeon: Tyler Pita, MD;  Location: [facility] ORS;  Service: Pulmonary;  Laterality: N/A;    FAMILY HISTORY: Reviewed and unchanged. No reported history of malignancy or chronic disease.     ADVANCED DIRECTIVES:    HEALTH MAINTENANCE: Social History   Tobacco Use  . Smoking status: Former Smoker    Packs/day: 0.25    Years: 2.00    Pack years: 0.50    Types: Cigarettes    Quit date: 12/12/1955    Years since quitting: 64.2  . Smokeless tobacco: Never Used  Vaping Use  . Vaping Use: Never used  Substance Use Topics  . Alcohol use: Yes    Comment: wine occasionally  . Drug use: No     Colonoscopy:  PAP:  Bone density:  Lipid panel:  Allergies  Allergen Reactions  . Prednisone Other (See Comments)    Mood alterations    Current Outpatient Medications  Medication Sig Dispense Refill  . acetaminophen (TYLENOL) 500 MG tablet Take 500 mg by mouth every 8 (eight) hours as needed for moderate pain.     Marland Kitchen aspirin EC 81 MG tablet Take 81 mg by mouth at bedtime. Swallow whole.     . carvedilol (COREG) 3.125 MG tablet Take 3.125 mg by mouth 2 (two) times daily.     . Cholecalciferol (VITAMIN D-3 PO) Take 1,000 mg by mouth daily.     . Coenzyme Q10 (CO Q 10 PO) Take 100 mg by mouth daily.     Marland Kitchen levothyroxine (SYNTHROID) 25 MCG tablet Take 25 mcg by mouth daily before breakfast.    . lidocaine-prilocaine (EMLA)  cream Apply to affected area once 30 g 3  . mirtazapine (REMERON) 7.5 MG tablet Take 7.5 mg by mouth at bedtime.    . nitroGLYCERIN (NITROSTAT) 0.4 MG SL tablet Place 0.4 mg under the tongue every 5 (five) minutes x 3 doses as needed for chest pain.     . simvastatin (ZOCOR) 40 MG tablet Take 40 mg by mouth at bedtime.     . pantoprazole (PROTONIX) 20 MG tablet Take 1 tablet (20 mg total) by mouth daily. 30 tablet 1   No current facility-administered medications for this visit.   Facility-Administered Medications Ordered in Other Visits  Medication Dose Route Frequency Provider Last Rate Last Admin  . CARBOplatin (PARAPLATIN) 250 mg in sodium chloride 0.9 % 250 mL chemo infusion  250 mg Intravenous Once Lloyd Huger, MD      . PACLitaxel (TAXOL) 180 mg in sodium chloride 0.9 % 250 mL  chemo infusion (> 80mg /m2)  110 mg/m2 (Treatment Plan Recorded) Intravenous Once Lloyd Huger, MD 93 mL/hr at 03/06/20 1211 180 mg at 03/06/20 1211    OBJECTIVE: Vitals:   03/06/20 0932  BP: (!) 158/80  Pulse: 83  Resp: 18  Temp: 99 F (37.2 C)  SpO2: 94%     Body mass index is 21.94 kg/m.    ECOG FS:2 - Symptomatic, <50% confined to bed  General: Thin, no acute distress.  Sitting in a wheelchair. Eyes: Pink conjunctiva, anicteric sclera. HEENT: Normocephalic, moist mucous membranes. Lungs: No audible wheezing or coughing. Heart: Regular rate and rhythm. Abdomen: Soft, nontender, no obvious distention. Musculoskeletal: No edema, cyanosis, or clubbing. Neuro: Alert, answering all questions appropriately. Cranial nerves grossly intact. Skin: No rashes or petechiae noted. Psych: Normal affect.  LAB RESULTS:  Lab Results  Component Value Date   NA 138 03/06/2020   K 3.5 03/06/2020   CL 103 03/06/2020   CO2 26 03/06/2020   GLUCOSE 150 (H) 03/06/2020   BUN 18 03/06/2020   CREATININE 1.01 (H) 03/06/2020   CALCIUM 8.4 (L) 03/06/2020   PROT 6.6 03/06/2020   ALBUMIN 3.2 (L)  03/06/2020   AST 21 03/06/2020   ALT 11 03/06/2020   ALKPHOS 52 03/06/2020   BILITOT 0.7 03/06/2020   GFRNONAA 51 (L) 03/06/2020   GFRAA 58 (L) 02/21/2020    Lab Results  Component Value Date   WBC 8.6 03/06/2020   NEUTROABS 4.7 03/06/2020   HGB 10.6 (L) 03/06/2020   HCT 32.4 (L) 03/06/2020   MCV 94.2 03/06/2020   PLT 209 03/06/2020     STUDIES: No results found.  ASSESSMENT: Stage IVb ovarian cancer.  PLAN:    1.  Stage IVb ovarian cancer: Despite history of breast cancer, biopsy consistent with carcinoma of gynecologic origin.  PET scan results from December 11, 2019 reviewed independently with large right pelvic mass, extensive retroperitoneal lymphadenopathy and bilateral pulmonary disease consistent with metastatic ovarian cancer.  Appreciate gynecology oncology input.  Patient only received 1 dose of dose reduced carboplatinum and Taxol on January 16, 2020 before requiring surgery for malignant obstruction related to her malignancy.  Despite not having treatment since then, patient's CA-125 has remained essentially stable at 1057.  Patient states she wishes to reattempt chemotherapy, therefore will proceed with cycle 2 of dose reduced carboplatin and Taxol today.  Hospice was previously discussed, but patient declined at this time.  Return to clinic in 1 week for laboratory work and further evaluation and then in 3 weeks for laboratory work, further evaluation, and consideration of cycle 3.  Appreciate palliative care input.  2.  History of breast cancer: Patient has a history of stage IIb ER/PR positive, upper inner quadrant right breast.  Previously we did not have access to patient's pathology report, her stage and location of breast were determined based on clinic notes.  She received 6 months of Arimidex between April 2013 and October 2013 prior to undergoing right breast lumpectomy. She reports she underwent adjuvant XRT, but did not receive adjuvant chemotherapy.  Patient  completed 5 years of Arimidex in October 2018.  Her most recent mammogram on January 30, 2019 was reported as BI-RADS 1.   3.  Anxiety: Patient receives IV Ativan with patient's premeds for each treatment.  Continue Xanax as needed at home. 4.  Diverting colostomy: Continue home health care and follow-up with surgery as indicated. 5.  Leukocytosis: Resolved. 6.  Anemia: Chronic and unchanged.  Patient's hemoglobin  is 10.6.  I spent a total of 30 minutes reviewing chart data, face-to-face evaluation with the patient, counseling and coordination of care as detailed above.  Patient expressed understanding and was in agreement with this plan. She also understands that She can call clinic at any time with any questions, concerns, or complaints.    Lloyd Huger, MD   03/06/2020 1:12 PM

## 2020-02-29 NOTE — Progress Notes (Addendum)
Nutrition  Patient did not show up for nutrition appointment scheduled for 9am today.  RD tried to call patient on mobile number and no answer.  Unable to leave voicemail as mailbox is full.  Will notify provider and scheduling to offer another appointment  Kip Kautzman B. Zenia [facility], [place], [place] Registered Dietitian [phone] (mobile)

## 2020-03-05 ENCOUNTER — Encounter: Payer: Self-pay | Admitting: Oncology

## 2020-03-05 ENCOUNTER — Other Ambulatory Visit: Payer: Self-pay

## 2020-03-05 MED ORDER — PANTOPRAZOLE SODIUM 20 MG PO TBEC: 20.0000 mg | DELAYED_RELEASE_TABLET | Freq: Every day | ORAL | 1 refills | Status: AC

## 2020-03-05 NOTE — Addendum Note (Signed)
Encounter addended by: Arne Cleveland, MD on: 03/05/2020 2:57 PM  Actions taken: Clinical Note Signed

## 2020-03-05 NOTE — Progress Notes (Signed)
Patient called pre assessment and she denies any pain or concerns at this time.

## 2020-03-05 NOTE — Interval H&P Note (Signed)
History and Physical Interval Note:  03/05/2020 2:54 PM  Sherri [name]  has presented today for surgery, with the diagnosis of Stage IVb ovarian cancer.  The various methods of treatment have been discussed with the patient and family. After consideration of risks, benefits and other options for treatment, the patient has consented to  port placement .  The patient's history has been reviewed, patient examined, no change in status, stable for surgery.  I have reviewed the patient's chart and labs.  Questions were answered to the patient's satisfaction.     [place]

## 2020-03-06 ENCOUNTER — Telehealth: Payer: Self-pay | Admitting: Nurse Practitioner

## 2020-03-06 ENCOUNTER — Encounter: Payer: Self-pay | Admitting: Oncology

## 2020-03-06 ENCOUNTER — Inpatient Hospital Stay: Payer: Medicare PPO | Attending: Oncology

## 2020-03-06 ENCOUNTER — Inpatient Hospital Stay (HOSPITAL_BASED_OUTPATIENT_CLINIC_OR_DEPARTMENT_OTHER): Payer: Medicare PPO | Admitting: Hospice and Palliative Medicine

## 2020-03-06 ENCOUNTER — Inpatient Hospital Stay: Payer: Medicare PPO

## 2020-03-06 ENCOUNTER — Inpatient Hospital Stay (HOSPITAL_BASED_OUTPATIENT_CLINIC_OR_DEPARTMENT_OTHER): Payer: Medicare PPO | Admitting: Oncology

## 2020-03-06 ENCOUNTER — Other Ambulatory Visit: Payer: Self-pay

## 2020-03-06 VITALS — BP 158/80 | HR 83 | Temp 99.0°F | Resp 18 | Ht 64.0 in | Wt 127.8 lb

## 2020-03-06 DIAGNOSIS — D649 Anemia, unspecified: Secondary | ICD-10-CM | POA: Insufficient documentation

## 2020-03-06 DIAGNOSIS — Z853 Personal history of malignant neoplasm of breast: Secondary | ICD-10-CM | POA: Insufficient documentation

## 2020-03-06 DIAGNOSIS — C569 Malignant neoplasm of unspecified ovary: Secondary | ICD-10-CM

## 2020-03-06 DIAGNOSIS — F419 Anxiety disorder, unspecified: Secondary | ICD-10-CM | POA: Diagnosis not present

## 2020-03-06 DIAGNOSIS — Z5189 Encounter for other specified aftercare: Secondary | ICD-10-CM | POA: Insufficient documentation

## 2020-03-06 DIAGNOSIS — Z515 Encounter for palliative care: Secondary | ICD-10-CM | POA: Diagnosis not present

## 2020-03-06 DIAGNOSIS — K9403 Colostomy malfunction: Secondary | ICD-10-CM | POA: Diagnosis not present

## 2020-03-06 DIAGNOSIS — Z5111 Encounter for antineoplastic chemotherapy: Secondary | ICD-10-CM | POA: Insufficient documentation

## 2020-03-06 LAB — CBC WITH DIFFERENTIAL/PLATELET
Abs Immature Granulocytes: 0.02 10*3/uL (ref 0.00–0.07)
Basophils Absolute: 0.1 10*3/uL (ref 0.0–0.1)
Basophils Relative: 1 %
Eosinophils Absolute: 0.4 10*3/uL (ref 0.0–0.5)
Eosinophils Relative: 4 %
HCT: 32.4 % — ABNORMAL LOW (ref 36.0–46.0)
Hemoglobin: 10.6 g/dL — ABNORMAL LOW (ref 12.0–15.0)
Immature Granulocytes: 0 %
Lymphocytes Relative: 32 %
Lymphs Abs: 2.8 10*3/uL (ref 0.7–4.0)
MCH: 30.8 pg (ref 26.0–34.0)
MCHC: 32.7 g/dL (ref 30.0–36.0)
MCV: 94.2 fL (ref 80.0–100.0)
Monocytes Absolute: 0.7 10*3/uL (ref 0.1–1.0)
Monocytes Relative: 8 %
Neutro Abs: 4.7 10*3/uL (ref 1.7–7.7)
Neutrophils Relative %: 55 %
Platelets: 209 10*3/uL (ref 150–400)
RBC: 3.44 MIL/uL — ABNORMAL LOW (ref 3.87–5.11)
RDW: 15.2 % (ref 11.5–15.5)
WBC: 8.6 10*3/uL (ref 4.0–10.5)
nRBC: 0 % (ref 0.0–0.2)

## 2020-03-06 LAB — COMPREHENSIVE METABOLIC PANEL
ALT: 11 U/L (ref 0–44)
AST: 21 U/L (ref 15–41)
Albumin: 3.2 g/dL — ABNORMAL LOW (ref 3.5–5.0)
Alkaline Phosphatase: 52 U/L (ref 38–126)
Anion gap: 9 (ref 5–15)
BUN: 18 mg/dL (ref 8–23)
CO2: 26 mmol/L (ref 22–32)
Calcium: 8.4 mg/dL — ABNORMAL LOW (ref 8.9–10.3)
Chloride: 103 mmol/L (ref 98–111)
Creatinine, Ser: 1.01 mg/dL — ABNORMAL HIGH (ref 0.44–1.00)
GFR calc non Af Amer: 51 mL/min — ABNORMAL LOW (ref >60)
Glucose, Bld: 150 mg/dL — ABNORMAL HIGH (ref 70–99)
Potassium: 3.5 mmol/L (ref 3.5–5.1)
Sodium: 138 mmol/L (ref 135–145)
Total Bilirubin: 0.7 mg/dL (ref 0.3–1.2)
Total Protein: 6.6 g/dL (ref 6.5–8.1)

## 2020-03-06 MED ORDER — PALONOSETRON HCL INJECTION 0.25 MG/5ML
0.2500 mg | Freq: Once | INTRAVENOUS | Status: AC
  Administered 2020-03-06: 0.25 mg via INTRAVENOUS
  Filled 2020-03-06: qty 5

## 2020-03-06 MED ORDER — SODIUM CHLORIDE 0.9 % IV SOLN
Freq: Once | INTRAVENOUS | Status: AC
  Filled 2020-03-06: qty 250

## 2020-03-06 MED ORDER — FAMOTIDINE IN NACL 20-0.9 MG/50ML-% IV SOLN
20.0000 mg | Freq: Once | INTRAVENOUS | Status: AC
  Administered 2020-03-06: 20 mg via INTRAVENOUS
  Filled 2020-03-06: qty 50

## 2020-03-06 MED ORDER — DIPHENHYDRAMINE HCL 50 MG/ML IJ SOLN
25.0000 mg | Freq: Once | INTRAMUSCULAR | Status: AC
  Administered 2020-03-06: 25 mg via INTRAVENOUS
  Filled 2020-03-06: qty 1

## 2020-03-06 MED ORDER — SODIUM CHLORIDE 0.9 % IV SOLN
10.0000 mg | Freq: Once | INTRAVENOUS | Status: AC
  Administered 2020-03-06: 10 mg via INTRAVENOUS
  Filled 2020-03-06: qty 10

## 2020-03-06 MED ORDER — HEPARIN SOD (PORK) LOCK FLUSH 100 UNIT/ML IV SOLN
500.0000 [IU] | Freq: Once | INTRAVENOUS | Status: AC | PRN
  Administered 2020-03-06: 500 [IU]
  Filled 2020-03-06: qty 5

## 2020-03-06 MED ORDER — SODIUM CHLORIDE 0.9% FLUSH
10.0000 mL | INTRAVENOUS | Status: DC | PRN
  Administered 2020-03-06: 10 mL
  Filled 2020-03-06: qty 10

## 2020-03-06 MED ORDER — HEPARIN SOD (PORK) LOCK FLUSH 100 UNIT/ML IV SOLN
INTRAVENOUS | Status: AC
  Filled 2020-03-06: qty 5

## 2020-03-06 MED ORDER — SODIUM CHLORIDE 0.9 % IV SOLN
150.0000 mg | Freq: Once | INTRAVENOUS | Status: AC
  Administered 2020-03-06: 150 mg via INTRAVENOUS
  Filled 2020-03-06: qty 5

## 2020-03-06 MED ORDER — LORAZEPAM 2 MG/ML IJ SOLN
0.5000 mg | Freq: Once | INTRAMUSCULAR | Status: AC
  Administered 2020-03-06: 0.5 mg via INTRAVENOUS
  Filled 2020-03-06: qty 1

## 2020-03-06 MED ORDER — SODIUM CHLORIDE 0.9 % IV SOLN
110.0000 mg/m2 | Freq: Once | INTRAVENOUS | Status: AC
  Administered 2020-03-06: 180 mg via INTRAVENOUS
  Filled 2020-03-06: qty 30

## 2020-03-06 MED ORDER — SODIUM CHLORIDE 0.9 % IV SOLN
253.2000 mg | Freq: Once | INTRAVENOUS | Status: AC
  Administered 2020-03-06: 250 mg via INTRAVENOUS
  Filled 2020-03-06: qty 25

## 2020-03-06 NOTE — Telephone Encounter (Signed)
Zoom meeting for follow-up discussion with Sherri [name] son Sherri [name], nephew Ray, [name] NP and I. We talked about past medical history of metastatic cancer, events surrounding colostomy in addition to chemotherapy treatment. We talked about Ms [name] overall clinical condition and the setting of natural aging with multiple catastrophic events. We talked about her appointment with Dr [name] Oncologist today and [name] NP. We talked in depth about medical goals of care including cancer treatments, therapy who has been working with Ms [name]. We talked about the progress getting a little stronger. We talked about concerns with chemotherapy, adverse side effects. We talked about [name] and Ms [name] wishes for it to be her son Sherri [name]. We talked about last palliative care visit in the home. We talked about family dynamics that link. We talked about concern for the care requirements Ms [name] will need. We talked about Ms [name] wishes to be with her children in their home's and if that is not possible her wishes to be in a facility. We talked about caregiver stressors and fatigue. We talked about the importance of increasing communication among family members in planning for as Ms. [name] proceeds through chemotherapy treatments. We talked about option of facility placement and what that would look like. We talked about do not resuscitate, most form. Sherri [name] endorses he will speak with his brother and sister for further discussion about planning for increased in skill level as Ms Six progresses towards the end part of her life. Discuss the importance of continuing ongoing family discussions including the impact on caregiver fatigue with Ms. [name] brother and sister-in-law. Sherri [name] endorses he and [name] will continue discussions and planning. We talked about follow-up visits for Palliative care and Oncology. Contact information provided. Therapeutic listening, emotional support provided. Questions  answered to satisfaction.  Total time spent 75 minutes  Zoom meeting 60 minutes  Documentation 15 minutes

## 2020-03-06 NOTE — Progress Notes (Signed)
Dose for carb increase ok with MD, based on improved SCr

## 2020-03-06 NOTE — Progress Notes (Signed)
[place]  Telephone:([phone] [phone]   Name: Sherri [name] Date: 03/06/2020 MRN: [id number]  DOB: 1933/03/06  Patient Care Team: [name], MD as PCP - General (Family Medicine) Lloyd Huger, MD as Consulting Physician (Oncology) Clent Jacks, RN as Oncology Nurse Navigator    REASON FOR CONSULTATION: Sherri [name] is a 86 y.o. female with multiple medical problems including history of stage IIb breast cancer status post lumpectomy and XRT with completion of 5 years of Arimidex in October 2019. She was subsequently diagnosed with stage IV ovarian cancer in June 2021. PET scan on 12/11/2019 revealed diffuse pulmonary metastatic disease with extensive retroperitoneal lymphadenopathy and right ureter obstructed by her pelvic mass with right-sided hydronephrosis. Currently on systemic chemotherapy.  Patient was hospitalized 01/24/2020-01/30/2020 with progressive abdominal pain and distention.  CT of abdomen and pelvis revealed colonic obstruction secondary to her large right pelvic malignancy.  She is s/p diverting loop colostomy on 8/27 for palliation.  Palliative care was consulted to help address goals and manage ongoing symptoms.   SOCIAL HISTORY:     reports that she quit smoking about 64 years ago. Her smoking use included cigarettes. She has a 0.50 pack-year smoking history. She has never used smokeless tobacco. She reports current alcohol use. She reports that she does not use drugs.  Patient is divorced. She lives at home with her brother. She has a son in [place] and another son and daughter who live out of state. Patient has a nephew who is very involved in her care.  Patient previously worked as a Educational psychologist.  ADVANCE DIRECTIVES:  Does not have  CODE STATUS: DNR  PAST MEDICAL HISTORY: Past Medical History:  Diagnosis Date  . Anxiety   . Arthritis   . Breast cancer ([place]) 2013    RIGHT lumpectomy  . Bursitis of elbow    RIGHT  . Cancer [facility]) 2013   right breast lumpectomy with a few nodes removed  . Chronic kidney disease    stage 3  . Colitis   . Coronary artery disease   . Diverticulitis   . Dysrhythmia    according to anxiety, tachy  . Family history of breast cancer   . Family history of melanoma   . GERD (gastroesophageal reflux disease)    okay since took her gallbladder out  . H/O lumpectomy   . Heart disease   . History of breast cancer   . History of coronary artery stent placement 2007   one stent  . HOH (hard of hearing)   . Hypercholesteremia   . Hypertension   . Hypothyroidism   . Myocardial infarction ([place])    2007  . Osteopenia   . Palpitations   . Personal history of radiation therapy   . Skin cancer     PAST SURGICAL HISTORY:  Past Surgical History:  Procedure Laterality Date  . BREAST LUMPECTOMY Right 2013   lymph node dissection  . CATARACT EXTRACTION W/PHACO Right 07/23/2015   Procedure: CATARACT EXTRACTION PHACO AND INTRAOCULAR LENS PLACEMENT (IOC);  Surgeon: Birder Robson, MD;  Location: [facility] ORS;  Service: Ophthalmology;  Laterality: Right;  [place] 01:24 AP% 26.7 CDE 22.47 fluid pack lot # [id number]  . CATARACT EXTRACTION W/PHACO Left 08/13/2015   Procedure: CATARACT EXTRACTION PHACO AND INTRAOCULAR LENS PLACEMENT (IOC);  Surgeon: Birder Robson, MD;  Location: [facility] ORS;  Service: Ophthalmology;  Laterality: Left;  [place] 02:40 AP% 30.7 CDE 49.28 fluid pack lot #  [id number]  . CHOLECYSTECTOMY    . COLONOSCOPY    . CORONARY ANGIOPLASTY  2007  . EYE SURGERY     bilateral cataract  . HARDWARE REMOVAL Right 04/29/2016   Procedure: HARDWARE REMOVAL;  Surgeon: Hessie Knows, MD;  Location: [facility] ORS;  Service: Orthopedics;  Laterality: Right;  . IR IMAGING GUIDED PORT INSERTION  01/12/2020  . ORIF ELBOW FRACTURE Right 10/01/2015   Procedure: OPEN REDUCTION INTERNAL FIXATION (ORIF) ELBOW/OLECRANON FRACTURE;  Surgeon: Hessie Knows,  MD;  Location: [facility] ORS;  Service: Orthopedics;  Laterality: Right;  . TRANSVERSE LOOP COLOSTOMY N/A 01/26/2020   Procedure: TRANSVERSE LOOP COLOSTOMY;  Surgeon: Ronny Bacon, MD;  Location: [facility];  Service: General;  Laterality: N/A;  . VIDEO BRONCHOSCOPY WITH ENDOBRONCHIAL NAVIGATION N/A 01/01/2020   Procedure: VIDEO BRONCHOSCOPY WITH ENDOBRONCHIAL NAVIGATION WITH CELLZIZIO;  Surgeon: Tyler Pita, MD;  Location: [facility];  Service: Pulmonary;  Laterality: N/A;  . VIDEO BRONCHOSCOPY WITH ENDOBRONCHIAL ULTRASOUND N/A 01/01/2020   Procedure: VIDEO BRONCHOSCOPY WITH ENDOBRONCHIAL ULTRASOUND;  Surgeon: Tyler Pita, MD;  Location: [facility] OR[facility];  Service: Pulmonary;  Laterality: N/A;    HEMATOLOGY/ONCOLOGY HISTORY:  Oncology History  Ovarian cancer ([place])  01/05/2020 Initial Diagnosis   Ovarian cancer ([place])   01/12/2020 Cancer Staging   Staging form: Ovary, Fallopian Tube, and Primary Peritoneal Carcinoma, AJCC 8th Edition - Clinical: FIGO Stage IVB (cTX, cN1b, pM1b) - Signed by Lloyd Huger, MD on 01/12/2020   01/16/2020 -  Chemotherapy   The patient had palonosetron (ALOXI) injection 0.25 mg, 0.25 mg, Intravenous,  Once, 1 of 6 cycles Administration: 0.25 mg (01/16/2020) pegfilgrastim (NEULASTA ONPRO KIT) injection 6 mg, 6 mg, Subcutaneous, Once, 1 of 6 cycles Administration: 6 mg (01/16/2020) CARBOplatin (PARAPLATIN) 210 mg in sodium chloride 0.9 % 250 mL chemo infusion, 210 mg (103.7 % of original dose 204.4 mg), Intravenous,  Once, 1 of 6 cycles Dose modification:   (original dose 204.4 mg, Cycle 1) Administration: 210 mg (01/16/2020) fosaprepitant (EMEND) 150 mg in sodium chloride 0.9 % 145 mL IVPB, 150 mg, Intravenous,  Once, 1 of 6 cycles Administration: 150 mg (01/16/2020) PACLitaxel (TAXOL) 180 mg in sodium chloride 0.9 % 250 mL chemo infusion (> 93m/m2), 110 mg/m2 = 180 mg (100 % of original dose 110 mg/m2), Intravenous,  Once, 1 of 6 cycles Dose modification: 110 mg/m2  (original dose 110 mg/m2, Cycle 1, Reason: Patient Age) Administration: 180 mg (01/16/2020)  for chemotherapy treatment.      ALLERGIES:  is allergic to prednisone.  MEDICATIONS:  Current Outpatient Medications  Medication Sig Dispense Refill  . acetaminophen (TYLENOL) 500 MG tablet Take 500 mg by mouth every 8 (eight) hours as needed for moderate pain.     .Marland Kitchenaspirin EC 81 MG tablet Take 81 mg by mouth at bedtime. Swallow whole.     . carvedilol (COREG) 3.125 MG tablet Take 3.125 mg by mouth 2 (two) times daily.     . Cholecalciferol (VITAMIN D-3 PO) Take 1,000 mg by mouth daily.     . Coenzyme Q10 (CO Q 10 PO) Take 100 mg by mouth daily.     .Marland Kitchenlevothyroxine (SYNTHROID) 25 MCG tablet Take 25 mcg by mouth daily before breakfast.    . lidocaine-prilocaine (EMLA) cream Apply to affected area once 30 g 3  . mirtazapine (REMERON) 7.5 MG tablet Take 7.5 mg by mouth at bedtime.    . nitroGLYCERIN (NITROSTAT) 0.4 MG SL tablet Place 0.4 mg under the tongue every 5 (five) minutes  x 3 doses as needed for chest pain.     . pantoprazole (PROTONIX) 20 MG tablet Take 1 tablet (20 mg total) by mouth daily. 30 tablet 1  . simvastatin (ZOCOR) 40 MG tablet Take 40 mg by mouth at bedtime.      No current facility-administered medications for this visit.    VITAL SIGNS: BP (!) 158/80 (BP Location: Right Arm, Patient Position: Sitting, Cuff Size: Normal)   Pulse 83   Temp 99 F (37.2 C) (Tympanic)   Resp 18   Ht _0  (1.626 m)   Wt 127 lb 12.8 oz (58 kg)   SpO2 94%   BMI 21.94 kg/m  Filed Weights   03/06/20 0934  Weight: 127 lb 12.8 oz (58 kg)    Estimated body mass index is 21.94 kg/m as calculated from the following:   Height as of this encounter: _1  (1.626 m).   Weight as of this encounter: 127 lb 12.8 oz (58 kg).  LABS: CBC:    Component Value Date/Time   WBC 8.6 03/06/2020 0912   HGB 10.6 (L) 03/06/2020 0912   HCT 32.4 (L) 03/06/2020 0912   PLT 209 03/06/2020 0912   MCV 94.2  03/06/2020 0912   NEUTROABS 4.7 03/06/2020 0912   LYMPHSABS 2.8 03/06/2020 0912   MONOABS 0.7 03/06/2020 0912   EOSABS 0.4 03/06/2020 0912   BASOSABS 0.1 03/06/2020 0912   Comprehensive Metabolic Panel:    Component Value Date/Time   NA 138 03/06/2020 0912   K 3.5 03/06/2020 0912   CL 103 03/06/2020 0912   CO2 26 03/06/2020 0912   BUN 18 03/06/2020 0912   CREATININE 1.01 (H) 03/06/2020 0912   GLUCOSE 150 (H) 03/06/2020 0912   CALCIUM 8.4 (L) 03/06/2020 0912   AST 21 03/06/2020 0912   ALT 11 03/06/2020 0912   ALKPHOS 52 03/06/2020 0912   BILITOT 0.7 03/06/2020 0912   PROT 6.6 03/06/2020 0912   ALBUMIN 3.2 (L) 03/06/2020 0912    RADIOGRAPHIC STUDIES: No results found.  PERFORMANCE STATUS (ECOG) : 2 - Symptomatic, <50% confined to bed  Review of Systems Unless otherwise noted, a complete review of systems is negative.  Physical Exam General: NAD, frail appearing Pulmonary: Unlabored Extremities: no edema, no joint deformities Skin: no rashes Neurological: Weakness but otherwise nonfocal  IMPRESSION: Routine follow-up visit.  Patient was accompanied by her nephew.  Patient reports doing reasonably well.  She denies any significant changes or concerns since last seen.  She feels stronger.  Appetite is reportedly decent.  Still having difficulty with her ostomy leaking and patient has been referred to the wound center.  Patient has decided to pursue additional chemotherapy, which will start today.  Discussed symptom management resources if needed.  Note family meeting planned later today with community palliative care.  PLAN: -Continue current scope of treatment -RTC in 1 week  Case and plan discussed with Dr. [name]  Patient expressed understanding and was in agreement with this plan. She also understands that She can call the clinic at any time with any questions, concerns, or complaints.     Time Total: 20 minutes  Visit consisted of counseling and  education dealing with the complex and emotionally intense issues of symptom management and palliative care in the setting of serious and potentially life-threatening illness.Greater than 50%  of this time was spent counseling and coordinating care related to the above assessment and plan.  Signed by: Altha Harm, PhD, NP-C

## 2020-03-07 LAB — CA 125: Cancer Antigen (CA) 125: 785 U/mL — ABNORMAL HIGH (ref 0.0–38.1)

## 2020-03-07 NOTE — Progress Notes (Signed)
[place]  [phone] Fax:([phone]  ID: [name] OB: May 29, 1933  MR#: [id number]  TOI#:[id number]  Patient Care Team: [name], MD as PCP - General (Family Medicine) Lloyd Huger, MD as Consulting Physician (Oncology) Clent Jacks, RN as Oncology Nurse Navigator  CHIEF COMPLAINT: Stage IVb ovarian cancer.  INTERVAL HISTORY: Patient returns to clinic today for further evaluation and to assess her toleration of dose reduced carboplatinum and Taxol.  Her weakness and fatigue are improving and she no longer is requiring a wheelchair.  She continues to have an improved appetite.  She has occasional shortness of breath, but states this is improving as well.  She denies any pain.  She has no neurologic complaints.  She denies any fevers.  She has no chest pain, cough, or hemoptysis.  She denies any abdominal pain.  She does not complain of issues with her colostomy today.  She denies any nausea, vomiting, constipation, or diarrhea.  She has no urinary complaints.  Patient offers no further specific complaints today.  REVIEW OF SYSTEMS:   Review of Systems  Constitutional: Positive for malaise/fatigue. Negative for diaphoresis, fever and weight loss.  Respiratory: Positive for shortness of breath. Negative for cough.   Cardiovascular: Negative.  Negative for chest pain and leg swelling.  Gastrointestinal: Negative.  Negative for abdominal pain, nausea and vomiting.  Genitourinary: Negative.  Negative for dysuria.  Musculoskeletal: Negative.  Negative for joint pain.  Skin: Negative.  Negative for rash.  Neurological: Positive for weakness. Negative for dizziness, sensory change and headaches.  Psychiatric/Behavioral: Negative.  The patient is not nervous/anxious.     As per HPI. Otherwise, a complete review of systems is negative.  PAST MEDICAL HISTORY: Past Medical History:  Diagnosis Date  . Anxiety   . Arthritis   .  Breast cancer ([place]) 2013   RIGHT lumpectomy  . Bursitis of elbow    RIGHT  . Cancer [facility]) 2013   right breast lumpectomy with a few nodes removed  . Chronic kidney disease    stage 3  . Colitis   . Coronary artery disease   . Diverticulitis   . Dysrhythmia    according to anxiety, tachy  . Family history of breast cancer   . Family history of melanoma   . GERD (gastroesophageal reflux disease)    okay since took her gallbladder out  . H/O lumpectomy   . Heart disease   . History of breast cancer   . History of coronary artery stent placement 2007   one stent  . HOH (hard of hearing)   . Hypercholesteremia   . Hypertension   . Hypothyroidism   . Myocardial infarction ([place])    2007  . Osteopenia   . Palpitations   . Personal history of radiation therapy   . Skin cancer     PAST SURGICAL HISTORY: Past Surgical History:  Procedure Laterality Date  . BREAST LUMPECTOMY Right 2013   lymph node dissection  . CATARACT EXTRACTION W/PHACO Right 07/23/2015   Procedure: CATARACT EXTRACTION PHACO AND INTRAOCULAR LENS PLACEMENT (IOC);  Surgeon: Birder Robson, MD;  Location: [facility];  Service: Ophthalmology;  Laterality: Right;  [place] 01:24 AP% 26.7 CDE 22.47 fluid pack lot # [id number]  . CATARACT EXTRACTION W/PHACO Left 08/13/2015   Procedure: CATARACT EXTRACTION PHACO AND INTRAOCULAR LENS PLACEMENT (IOC);  Surgeon: Birder Robson, MD;  Location: [facility] OR[facility];  Service: Ophthalmology;  Laterality: Left;  [place] 02:40 AP% 30.7 CDE 49.28 fluid pack  lot # [id number]  . CHOLECYSTECTOMY    . COLONOSCOPY    . CORONARY ANGIOPLASTY  2007  . EYE SURGERY     bilateral cataract  . HARDWARE REMOVAL Right 04/29/2016   Procedure: HARDWARE REMOVAL;  Surgeon: Hessie Knows, MD;  Location: [facility];  Service: Orthopedics;  Laterality: Right;  . IR IMAGING GUIDED PORT INSERTION  01/12/2020  . ORIF ELBOW FRACTURE Right 10/01/2015   Procedure: OPEN REDUCTION INTERNAL FIXATION (ORIF) ELBOW/OLECRANON  FRACTURE;  Surgeon: Hessie Knows, MD;  Location: [facility] OR[facility];  Service: Orthopedics;  Laterality: Right;  . TRANSVERSE LOOP COLOSTOMY N/A 01/26/2020   Procedure: TRANSVERSE LOOP COLOSTOMY;  Surgeon: Ronny Bacon, MD;  Location: [facility];  Service: General;  Laterality: N/A;  . VIDEO BRONCHOSCOPY WITH ENDOBRONCHIAL NAVIGATION N/A 01/01/2020   Procedure: VIDEO BRONCHOSCOPY WITH ENDOBRONCHIAL NAVIGATION WITH CELLZIZIO;  Surgeon: Tyler Pita, MD;  Location: [facility];  Service: Pulmonary;  Laterality: N/A;  . VIDEO BRONCHOSCOPY WITH ENDOBRONCHIAL ULTRASOUND N/A 01/01/2020   Procedure: VIDEO BRONCHOSCOPY WITH ENDOBRONCHIAL ULTRASOUND;  Surgeon: Tyler Pita, MD;  Location: [facility];  Service: Pulmonary;  Laterality: N/A;    FAMILY HISTORY: Reviewed and unchanged. No reported history of malignancy or chronic disease.     ADVANCED DIRECTIVES:    HEALTH MAINTENANCE: Social History   Tobacco Use  . Smoking status: Former Smoker    Packs/day: 0.25    Years: 2.00    Pack years: 0.50    Types: Cigarettes    Quit date: 12/12/1955    Years since quitting: 64.2  . Smokeless tobacco: Never Used  Vaping Use  . Vaping Use: Never used  Substance Use Topics  . Alcohol use: Yes    Comment: wine occasionally  . Drug use: No     Colonoscopy:  PAP:  Bone density:  Lipid panel:  Allergies  Allergen Reactions  . Prednisone Other (See Comments)    Mood alterations    Current Outpatient Medications  Medication Sig Dispense Refill  . acetaminophen (TYLENOL) 500 MG tablet Take 500 mg by mouth every 8 (eight) hours as needed for moderate pain.     Marland Kitchen aspirin EC 81 MG tablet Take 81 mg by mouth at bedtime. Swallow whole.     . carvedilol (COREG) 3.125 MG tablet Take 3.125 mg by mouth 2 (two) times daily.     . Cholecalciferol (VITAMIN D-3 PO) Take 1,000 mg by mouth daily.     . Coenzyme Q10 (CO Q 10 PO) Take 100 mg by mouth daily.     Marland Kitchen levothyroxine (SYNTHROID) 25 MCG tablet Take 25 mcg  by mouth daily before breakfast.    . lidocaine-prilocaine (EMLA) cream Apply to affected area once 30 g 3  . mirtazapine (REMERON) 7.5 MG tablet Take 7.5 mg by mouth at bedtime.    . nitroGLYCERIN (NITROSTAT) 0.4 MG SL tablet Place 0.4 mg under the tongue every 5 (five) minutes x 3 doses as needed for chest pain.     . pantoprazole (PROTONIX) 20 MG tablet Take 1 tablet (20 mg total) by mouth daily. 30 tablet 1  . simvastatin (ZOCOR) 40 MG tablet Take 40 mg by mouth at bedtime.      No current facility-administered medications for this visit.    OBJECTIVE: Vitals:   03/12/20 1537  BP: (!) 157/65  Pulse: 74  Temp: (!) 97.3 F (36.3 C)  SpO2: 94%     Body mass index is 21.39 kg/m.    ECOG FS:1 - Symptomatic but  completely ambulatory  General: Thin, no acute distress. Eyes: Pink conjunctiva, anicteric sclera. HEENT: Normocephalic, moist mucous membranes. Lungs: No audible wheezing or coughing. Heart: Regular rate and rhythm. Abdomen: Soft, nontender, no obvious distention. Musculoskeletal: No edema, cyanosis, or clubbing. Neuro: Alert, answering all questions appropriately. Cranial nerves grossly intact. Skin: No rashes or petechiae noted. Psych: Normal affect.   LAB RESULTS:  Lab Results  Component Value Date   NA 138 03/13/2020   K 4.6 03/13/2020   CL 102 03/13/2020   CO2 30 03/13/2020   GLUCOSE 94 03/13/2020   BUN 21 03/13/2020   CREATININE 1.09 (H) 03/13/2020   CALCIUM 8.9 03/13/2020   PROT 7.0 03/13/2020   ALBUMIN 3.6 03/13/2020   AST 23 03/13/2020   ALT 12 03/13/2020   ALKPHOS 45 03/13/2020   BILITOT 0.6 03/13/2020   GFRNONAA 47 (L) 03/13/2020   GFRAA 58 (L) 02/21/2020    Lab Results  Component Value Date   WBC 5.8 03/13/2020   NEUTROABS PENDING 03/13/2020   HGB 10.0 (L) 03/13/2020   HCT 31.1 (L) 03/13/2020   MCV 95.1 03/13/2020   PLT 153 03/13/2020     STUDIES: No results found.  ASSESSMENT: Stage IVb ovarian cancer.  PLAN:    1.  Stage IVb  ovarian cancer: Despite history of breast cancer, biopsy consistent with carcinoma of gynecologic origin.  PET scan results from December 11, 2019 reviewed independently with large right pelvic mass, extensive retroperitoneal lymphadenopathy and bilateral pulmonary disease consistent with metastatic ovarian cancer.  Appreciate gynecology oncology input.  Patient only received 1 dose of dose reduced carboplatinum and Taxol on January 16, 2020 before requiring surgery for malignant obstruction related to her malignancy.  Patient's CA-125 is trending down is now 785.  Hospice was previously discussed, but patient declined at this time.  Patient tolerated cycle 2 without significant side effects.  Return to clinic in 2 weeks for further evaluation and consideration of cycle 3. 2.  History of breast cancer: Patient has a history of stage IIb ER/PR positive, upper inner quadrant right breast.  Previously we did not have access to patient's pathology report, her stage and location of breast were determined based on clinic notes.  She received 6 months of Arimidex between April 2013 and October 2013 prior to undergoing right breast lumpectomy. She reports she underwent adjuvant XRT, but did not receive adjuvant chemotherapy.  Patient completed 5 years of Arimidex in October 2018.  Her most recent mammogram on January 30, 2019 was reported as BI-RADS 1.   3.  Anxiety: Improved.  Patient receives IV Ativan with patient's premeds for each treatment.  Continue Xanax as needed at home. 4.  Diverting colostomy: Continue home health care and follow-up with surgery as indicated. 5.  Leukocytosis: Resolved. 6.  Anemia: Chronic and unchanged.  Patient's hemoglobin is 10.0.  Patient expressed understanding and was in agreement with this plan. She also understands that She can call clinic at any time with any questions, concerns, or complaints.    Lloyd Huger, MD   03/13/2020 11:56 AM

## 2020-03-12 ENCOUNTER — Encounter: Payer: Self-pay | Admitting: Oncology

## 2020-03-13 ENCOUNTER — Encounter: Payer: Self-pay | Admitting: Licensed Clinical Social Worker

## 2020-03-13 ENCOUNTER — Inpatient Hospital Stay (HOSPITAL_BASED_OUTPATIENT_CLINIC_OR_DEPARTMENT_OTHER): Payer: Medicare PPO | Admitting: Oncology

## 2020-03-13 ENCOUNTER — Ambulatory Visit: Payer: Self-pay | Admitting: Licensed Clinical Social Worker

## 2020-03-13 ENCOUNTER — Inpatient Hospital Stay: Payer: Medicare PPO

## 2020-03-13 ENCOUNTER — Other Ambulatory Visit: Payer: Self-pay

## 2020-03-13 VITALS — BP 157/65 | HR 74 | Temp 97.3°F | Wt 124.6 lb

## 2020-03-13 DIAGNOSIS — Z808 Family history of malignant neoplasm of other organs or systems: Secondary | ICD-10-CM

## 2020-03-13 DIAGNOSIS — Z803 Family history of malignant neoplasm of breast: Secondary | ICD-10-CM

## 2020-03-13 DIAGNOSIS — C569 Malignant neoplasm of unspecified ovary: Secondary | ICD-10-CM | POA: Diagnosis not present

## 2020-03-13 DIAGNOSIS — Z1379 Encounter for other screening for genetic and chromosomal anomalies: Secondary | ICD-10-CM | POA: Insufficient documentation

## 2020-03-13 DIAGNOSIS — Z853 Personal history of malignant neoplasm of breast: Secondary | ICD-10-CM

## 2020-03-13 DIAGNOSIS — Z5111 Encounter for antineoplastic chemotherapy: Secondary | ICD-10-CM | POA: Diagnosis not present

## 2020-03-13 LAB — COMPREHENSIVE METABOLIC PANEL
ALT: 12 U/L (ref 0–44)
AST: 23 U/L (ref 15–41)
Albumin: 3.6 g/dL (ref 3.5–5.0)
Alkaline Phosphatase: 45 U/L (ref 38–126)
Anion gap: 6 (ref 5–15)
BUN: 21 mg/dL (ref 8–23)
CO2: 30 mmol/L (ref 22–32)
Calcium: 8.9 mg/dL (ref 8.9–10.3)
Chloride: 102 mmol/L (ref 98–111)
Creatinine, Ser: 1.09 mg/dL — ABNORMAL HIGH (ref 0.44–1.00)
GFR, Estimated: 47 mL/min — ABNORMAL LOW (ref >60)
Glucose, Bld: 94 mg/dL (ref 70–99)
Potassium: 4.6 mmol/L (ref 3.5–5.1)
Sodium: 138 mmol/L (ref 135–145)
Total Bilirubin: 0.6 mg/dL (ref 0.3–1.2)
Total Protein: 7 g/dL (ref 6.5–8.1)

## 2020-03-13 NOTE — Progress Notes (Addendum)
HPI:  Ms. Howze was previously seen in the [facility] due to a personal and family history of cancer and concerns regarding a hereditary predisposition to cancer. Please refer to our prior cancer genetics clinic note for more information regarding our discussion, assessment and recommendations, at the time. Ms. Bungert recent genetic test results were disclosed to her, as were recommendations warranted by these results. These results and recommendations are discussed in more detail below.  CANCER HISTORY:  Oncology History  Ovarian cancer ([place])  01/05/2020 Initial Diagnosis   Ovarian cancer ([place])   01/12/2020 Cancer Staging   Staging form: Ovary, Fallopian Tube, and Primary Peritoneal Carcinoma, AJCC 8th Edition - Clinical: FIGO Stage IVB (cTX, cN1b, pM1b) - Signed by Lloyd Huger, MD on 01/12/2020   01/16/2020 -  Chemotherapy   The patient had palonosetron (ALOXI) injection 0.25 mg, 0.25 mg, Intravenous,  Once, 2 of 6 cycles Administration: 0.25 mg (01/16/2020), 0.25 mg (03/06/2020) pegfilgrastim (NEULASTA ONPRO KIT) injection 6 mg, 6 mg, Subcutaneous, Once, 2 of 6 cycles Administration: 6 mg (01/16/2020) CARBOplatin (PARAPLATIN) 210 mg in sodium chloride 0.9 % 250 mL chemo infusion, 210 mg (103.7 % of original dose 204.4 mg), Intravenous,  Once, 2 of 6 cycles Dose modification:   (original dose 204.4 mg, Cycle 1) Administration: 210 mg (01/16/2020), 250 mg (03/06/2020) fosaprepitant (EMEND) 150 mg in sodium chloride 0.9 % 145 mL IVPB, 150 mg, Intravenous,  Once, 2 of 6 cycles Administration: 150 mg (01/16/2020), 150 mg (03/06/2020) PACLitaxel (TAXOL) 180 mg in sodium chloride 0.9 % 250 mL chemo infusion (> 28m/m2), 110 mg/m2 = 180 mg (100 % of original dose 110 mg/m2), Intravenous,  Once, 2 of 6 cycles Dose modification: 110 mg/m2 (original dose 110 mg/m2, Cycle 1, Reason: Patient Age) Administration: 180 mg (01/16/2020), 180 mg (03/06/2020)  for chemotherapy treatment.      Genetic Testing   Negative germline and HRD testing. No pathogenic variants identified on the [facility] myChoice panel. The report date is 02/26/2020.  The [facility] panel offered by [employer] sequencing and deletion/duplication testing of the following 35 genes: APC, ATM, AXIN2, BARD1, BMPR1A, BRCA1, BRCA2, BRIP1, CHD1, CDK4, CDKN2A, CHEK2, EPCAM (large rearrangement only), HOXB13, GALNT12, MLH1, MSH2, MSH3, MSH6, MUTYH, NBN, NTHL1, PALB2, PMS2, PTEN, RAD51C, RAD51D, RNF43, RPS20, SMAD4, STK11, and TP53. Sequencing was performed for select regions of POLE and POLD1, and large rearrangement analysis was performed for select regions of GREM1.      FAMILY HISTORY:  We obtained a detailed, 4-generation family history.  Significant diagnoses are listed below: Family History  Problem Relation Age of Onset  . Cancer Father        unsure type  . Breast cancer Sister 652 . Melanoma Paternal Uncle   . Cancer Sister        unsure type  . Cancer Cousin        unsure type   Ms. EKerneyhas a daughter, 526 and two sons, 658and 645 no cancer history. She had 10 siblings, all have passed except for her brother who she lives with. Her brother's family helps to take care of her. One of the patient's sisters had breast cancer. Another sister had cancer, unknown type.   Ms. EMedellinmother died at 943 Patient had 8 maternal aunts/uncles, no known cancers. A maternal cousin did die from cancer but she is unsure the type. She does not have information about maternal grandparents.  Ms. EHerterfather  had cancer, unknown type. He passed at 57. Patient had 11 paternal aunts/uncles. An uncle had melanoma. No known cancers in paternal cousins. No information about paternal grandparents.  Ms. Rauh is unaware of previous family history of genetic testing for hereditary cancer risks. Patient's maternal ancestors are of unknown descent, and paternal ancestors are of unknown  descent. There is no reported Ashkenazi Jewish ancestry. There is no known consanguinity.     GENETIC TEST RESULTS: Genetic testing reported out on 02/26/2020 through the [facility]  cancer panel found no pathogenic mutations.   The [facility] gene panel offered by [employer] includes sequencing and deletion/duplication testing of the following 35 genes: APC, ATM, AXIN2, BARD1, BMPR1A, BRCA1, BRCA2, BRIP1, CHD1, CDK4, CDKN2A, CHEK2, EPCAM (large rearrangement only), HOXB13, GALNT12, MLH1, MSH2, MSH3, MSH6, MUTYH, NBN, NTHL1, PALB2, PMS2, PTEN, RAD51C, RAD51D, RNF43, RPS20, SMAD4, STK11, and TP53. Sequencing was performed for select regions of POLE and POLD1, and large rearrangement analysis was performed for select regions of GREM1. .   The test report has been scanned into EPIC and is located under the Molecular Pathology section of the Results Review tab.  A portion of the result report is included below for reference.       We discussed with Ms. Renbarger that because current genetic testing is not perfect, it is possible there may be a gene mutation in one of these genes that current testing cannot detect, but that chance is small.  We also discussed, that there could be another gene that has not yet been discovered, or that we have not yet tested, that is responsible for the cancer diagnoses in the family. It is also possible there is a hereditary cause for the cancer in the family that Ms. Demby did not inherit and therefore was not identified in her testing.  Therefore, it is important to remain in touch with cancer genetics in the future so that we can continue to offer Ms. Weins the most up to date genetic testing.   ADDITIONAL GENETIC TESTING: We discussed with Ms. Notarianni that her genetic testing was fairly extensive.  If there are genes identified to increase cancer risk that can be analyzed in the future, we would be happy to discuss and coordinate this testing at that time.     CANCER SCREENING RECOMMENDATIONS: Ms. Tatro test result is considered negative (normal).  This means that we have not identified a hereditary cause for her  personal and family history of cancer at this time.   While reassuring, this does not definitively rule out a hereditary predisposition to cancer. It is still possible that there could be genetic mutations that are undetectable by current technology. There could be genetic mutations in genes that have not been tested or identified to increase cancer risk.  Therefore, it is recommended she continue to follow the cancer management and screening guidelines provided by her oncology and primary healthcare provider.   An individual's cancer risk and medical management are not determined by genetic test results alone. Overall cancer risk assessment incorporates additional factors, including personal medical history, family history, and any available genetic information that may result in a personalized plan for cancer prevention and surveillance.  RECOMMENDATIONS FOR FAMILY MEMBERS:  Relatives in this family might be at some increased risk of developing cancer, over the general population risk, simply due to the family history of cancer.  We recommended female relatives in this family have a yearly mammogram beginning at age 51, or 6 years  younger than the earliest onset of cancer, an annual clinical breast exam, and perform monthly breast self-exams. Female relatives in this family should also have a gynecological exam as recommended by their primary provider.  All family members should be referred for colonoscopy starting at age 78.   It is also possible there is a hereditary cause for the cancer in Ms. Merwin's family that she did not inherit and therefore was not identified in her.  Based on Ms. Hepner's family history, we recommended those in her family who have had cancer/ her nieces/nephews have genetic counseling and testing. Ms. Bednarz will let us  know if we can be of any assistance in coordinating genetic counseling and/or testing for these family members.  FOLLOW-UP: Lastly, we discussed with Ms. Lalla that cancer genetics is a rapidly advancing field and it is possible that new genetic tests will be appropriate for her and/or her family members in the future. We encouraged her to remain in contact with cancer genetics on an annual basis so we can update her personal and family histories and let her know of advances in cancer genetics that may benefit this family.   Our contact number was provided. Ms. Gayton questions were answered to her satisfaction, and she knows she is welcome to call us at anytime with additional questions or concerns.   Faith Rogue, MS, [facility] Genetic Counselor [email or web address] Phone: [phone]

## 2020-03-13 NOTE — Telephone Encounter (Signed)
Revealed negative germline and somatic testing to patient's niece, [name].  We discussed that we do not know why she has cancer or why there is cancer in the family. It could be due to a different gene that we are not testing, or something our current technology cannot pick up.  It will be important for her to keep in contact with genetics to learn if additional testing may be needed in the future.

## 2020-03-14 LAB — CA 125: Cancer Antigen (CA) 125: 674 U/mL — ABNORMAL HIGH (ref 0.0–38.1)

## 2020-03-15 ENCOUNTER — Encounter: Payer: Self-pay | Admitting: Nurse Practitioner

## 2020-03-15 ENCOUNTER — Other Ambulatory Visit: Payer: Self-pay

## 2020-03-15 ENCOUNTER — Other Ambulatory Visit: Payer: Medicare PPO | Admitting: Nurse Practitioner

## 2020-03-15 DIAGNOSIS — Z515 Encounter for palliative care: Secondary | ICD-10-CM

## 2020-03-15 DIAGNOSIS — C569 Malignant neoplasm of unspecified ovary: Secondary | ICD-10-CM

## 2020-03-15 NOTE — Progress Notes (Signed)
[facility] Consult Note Telephone: [phone]  Fax: [phone]  PATIENT NAME: Sherri [name] DOB: [birth year] MRN: [id number]  PRIMARY CARE PROVIDER:   [name], MD  REFERRING PROVIDER:  [name], MD [phone] [facility] [place],  [place] [address] RESPONSIBLE PARTY:  Primary Contact:  Sherri [name] [phone]  Sherri [name] (Niece) cell:  [place] (patient's brother and Sherri [name]'s Dad):  [phone]  1.Advance Care Planning; DNR placed in Vynca; Thinking about possibility of chemotherapy but if not available, agreeable to Hospice   2. Goals of Care: Goals include to maximize quality of life and symptom management. Our advance care planning conversation included a discussion about:   The value and importance of advance care planning  Exploration of personal, cultural or spiritual beliefs that might influence medical decisions  Exploration of goals of care in the event of a sudden injury or illness  Identification and preparation of a healthcare agent  Review and updating or creation of anadvance directive document.  3.Palliative care encounter; Palliative care encounter; Palliative medicine team will continue to support patient, patient's family, and medical team. Visit consisted of counseling and education dealing with the complex and emotionally intense issues of symptom management and palliative care in the setting of serious and potentially life-threatening illness  4. f/u 4 weeks for further goc, ,monitoring and assistance for skilled placement  I spent 90 minutes providing this consultation,  from 9:00am to 10:30am. More than 50% of the time in this consultation was spent coordinating communication.   HISTORY OF PRESENT ILLNESS:  Sherri ZIRKELBACH is a 86 y.o. year old female with multiple medical problems including history of stage IIb  breast cancer status post lumpectomy and XRT with completion of 5 years of Arimidex in October 2019. She was subsequently diagnosed with stage IV ovarian cancer in June 2021. PET scan on 12/11/2019 revealed diffuse pulmonary metastatic disease with extensive retroperitoneal lymphadenopathy and right ureter obstructed by her pelvic mass with right-sided hydronephrosis. Currently on systemic chemotherapy.Patient was hospitalized 01/24/2020-01/30/2020 with progressive abdominal pain and distention. CT of abdomen and pelvis revealed colonic obstruction secondary to her large right pelvic malignancy. She is s/p diverting loop colostomy on 8/27 for palliation. In-person follow-up visit for palliative care for Sherri [name], Sherri [name], Sherri Sherri [name], and Sherri [name] Sherri [name] all present. Prior to Sherri. [name]'s joining the Palliative visit Sherri [name] and I talked at length about how he was feeling. Sherri [name] endorses the challenges with conflict between Sherri [name] his [name] and his sister Sherri [name]. We talked about coping strategies. We talked about importance of self-care, caregiver fatigue and stressors. We talked about Sherri [name]'s dementia. Sherri [name], Sherri [name] his son, Sherri. [name] nephew join the discussion. We talked about Sherri [name] last oncology visit for what she did receive chemotherapy. Sherri [name] endorses she tolerated the infusion and had no further side effects. No issues with nausea. Appetite seems to be improving. We talked about to close to me with upcoming wound care appointment in 2 weeks. Sherri [name] endorses continuing to have difficulty managing colostomy at home as it continues to leak. Sherri [name] endorses his [name] Sherri [name] and Sherri [name], Sherri [name] his daughter which is Sherri Stirling niece continues to care for Sherri [name] colostomy as she is unable to. We talked about the challenges with the current living situation. Sherri. [name] has now moved upstairs in the home. We talked about increase skill level. We talked  about facility placement and the purpose behind  proceeding with looking for a skilled facility. We talked about the difficulty with the care in the cloths to me. We talked about what a nursing home scenario would look like. We talked about the ability to take Sherri [name] out from the nursing home on visits and outings as well as for Physician appointments if needed. Sherri [name] endorses they have not approached the subject with Sherri [name] as of yet about placement. We did talk about placement last Palliative care visit with Sherri [name] present. Further discussions with Sherri [name], Sherri [name] Sherri pending Palliative Social Worker consult for further discussion of placement and actually looking for facilities. Rsy endorses Sherri [name] is also looking for facilities. Sherri [name] joined the discussion. Sherri. stellar gensel she is doing better. Her color has improved. Sherri [name] was operative with assessment. Sherri [name] denies symptoms of pain. We talked about shortness of breath for what she does not use her oxygen. We talked about the nodules in her lungs and correlation with the shortness of breath. We talked about fatigue. Sherri [name] does continue to work with therapy. We talked about appetite which is slowly improving. We talked about medical goals of care and her recent chemotherapy infusion. We talked about last oncology visit. Sherri [name] endorses she is not had side effects from the last treatment. We talked about Sherri. [name] moving upstairs in the home. We talked about disease progression of cancer. We talked about palliative chemotherapy versus curative. We talked about coping strategies. Therapeutic listening an emotional support provided. We talked about role of palliative care and plan of care. We talked about next follow up palliative care visit in 4 weeks, Sherri. [name] in agreement, appointment scheduled. Questions answered to satisfaction. Upon leaving Sherri [name] and I had a separate discussion concerning skilled facility, her Sherri Sherri [name]'s involvement and his sister Sherri [name] looking for placement. Discuss that  palliative social worker will contact call for further discussion of process facility list and recommended Sherri [name] to see if that can be a zoom for conference call among Sherri [name] and Sherri [name] so everybody gets the same information. We talked about the challenges with placement. Sherri [name] endorses it is going to be very difficult to get Sherri Lagerquist to move. We talked about the unhealthy situation between [place] and Sherri Loomis the conflict and stressors that it is causing Mr Doy [name]. We talked about challenges in these conversations. Offered Palliative care to assist with discussing with Sherri Sprick further facility placement. Sherri [name] endorses will get things lined up as far as facilities and then approached Sherri Strubel with changing locations. We talked about the challenges Sherri [name] will have when Sherri Hargraves does move out as he wishes to care for his sister to his own detriment. Sherri [name] express thankful for Palliative care visit. Sherri [name] talked about taking Sherri [name] and Sherri [name] to the mountains this weekend although Sherri Tissue is not able to be left alone. Sherri. Emory anxiety causes her to want people around her all the time. We talked about contacting Sherri [name] her Sherri to see if he can come up and stay with her so Sherri [name] and Sherri [name] can get away and have some time to themselves. Sherri [name] endorses he was going to see if Sherri [name] was available. Therapeutic listening and emotional support provided. Questions answered to satisfaction.  Palliative Care was asked to help to continue to address goals of care.   CODE STATUS: DNR  PPS: 50% HOSPICE ELIGIBILITY/DIAGNOSIS: TBD  PAST MEDICAL HISTORY:  Past Medical  History:  Diagnosis Date  . Anxiety   . Arthritis   . Breast cancer ([place]) 2013   RIGHT lumpectomy  . Bursitis of elbow    RIGHT  . Cancer [facility]) 2013   right breast lumpectomy with a few nodes removed  . Chronic kidney disease    stage 3  . Colitis   . Coronary artery disease   . Diverticulitis   . Dysrhythmia    according to anxiety, tachy  . Family history  of breast cancer   . Family history of melanoma   . GERD (gastroesophageal reflux disease)    okay since took her gallbladder out  . H/O lumpectomy   . Heart disease   . History of breast cancer   . History of coronary artery stent placement 2007   one stent  . HOH (hard of hearing)   . Hypercholesteremia   . Hypertension   . Hypothyroidism   . Myocardial infarction ([place])    2007  . Osteopenia   . Palpitations   . Personal history of radiation therapy   . Skin cancer     SOCIAL HX:  Social History   Tobacco Use  . Smoking status: Former Smoker    Packs/day: 0.25    Years: 2.00    Pack years: 0.50    Types: Cigarettes    Quit date: 12/12/1955    Years since quitting: 64.3  . Smokeless tobacco: Never Used  Substance Use Topics  . Alcohol use: Yes    Comment: wine occasionally    ALLERGIES:  Allergies  Allergen Reactions  . Prednisone Other (See Comments)    Mood alterations     PERTINENT MEDICATIONS:  Outpatient Encounter Medications as of 03/15/2020  Medication Sig  . acetaminophen (TYLENOL) 500 MG tablet Take 500 mg by mouth every 8 (eight) hours as needed for moderate pain.   Marland Kitchen aspirin EC 81 MG tablet Take 81 mg by mouth at bedtime. Swallow whole.   . carvedilol (COREG) 3.125 MG tablet Take 3.125 mg by mouth 2 (two) times daily.   . Cholecalciferol (VITAMIN D-3 PO) Take 1,000 mg by mouth daily.   . Coenzyme Q10 (CO Q 10 PO) Take 100 mg by mouth daily.   Marland Kitchen levothyroxine (SYNTHROID) 25 MCG tablet Take 25 mcg by mouth daily before breakfast.  . lidocaine-prilocaine (EMLA) cream Apply to affected area once  . mirtazapine (REMERON) 7.5 MG tablet Take 7.5 mg by mouth at bedtime.  . nitroGLYCERIN (NITROSTAT) 0.4 MG SL tablet Place 0.4 mg under the tongue every 5 (five) minutes x 3 doses as needed for chest pain.   . pantoprazole (PROTONIX) 20 MG tablet Take 1 tablet (20 mg total) by mouth daily.  . simvastatin (ZOCOR) 40 MG tablet Take 40 mg by mouth at bedtime.     No facility-administered encounter medications on file as of 03/15/2020.    PHYSICAL EXAM:   General: NAD, frail appearing, thin, pale female Cardiovascular: regular rate and rhythm Pulmonary: clear ant fields; few crackles bases Neurological: generalized weakness  Tazia Illescas Ihor Gully, NP

## 2020-03-20 LAB — CBC WITH DIFFERENTIAL/PLATELET
Abs Immature Granulocytes: 0 10*3/uL (ref 0.00–0.07)
Band Neutrophils: 7 %
Basophils Absolute: 0 10*3/uL (ref 0.0–0.1)
Basophils Relative: 0 %
Blasts: 0 %
Eosinophils Absolute: 0.4 10*3/uL (ref 0.0–0.5)
Eosinophils Relative: 7 %
HCT: 31.1 % — ABNORMAL LOW (ref 36.0–46.0)
Hemoglobin: 10 g/dL — ABNORMAL LOW (ref 12.0–15.0)
Lymphocytes Relative: 51 %
Lymphs Abs: 2.9 10*3/uL (ref 0.7–4.0)
MCH: 30.6 pg (ref 26.0–34.0)
MCHC: 32.2 g/dL (ref 30.0–36.0)
MCV: 95.1 fL (ref 80.0–100.0)
Metamyelocytes Relative: 0 %
Monocytes Absolute: 0.1 10*3/uL (ref 0.1–1.0)
Monocytes Relative: 1 %
Myelocytes: 0 %
Neutro Abs: 2.4 10*3/uL (ref 1.7–7.7)
Neutrophils Relative %: 34 %
Other: 0 %
Platelets: 153 10*3/uL (ref 150–400)
Promyelocytes Relative: 0 %
RBC: 3.27 MIL/uL — ABNORMAL LOW (ref 3.87–5.11)
RDW: 14.6 % (ref 11.5–15.5)
Smear Review: ADEQUATE
WBC: 5.8 10*3/uL (ref 4.0–10.5)
nRBC: 0 % (ref 0.0–0.2)
nRBC: 0 /100 WBC

## 2020-03-22 NOTE — Progress Notes (Signed)
[name]  [phone] [phone]  ID: Sherri [name] OB: 1932-10-09  MR#: [id number]  GYJ#:[id number]  Patient Care Team: [name], MD as PCP - General (Family Medicine) Lloyd Huger, MD as Consulting Physician (Oncology) Clent Jacks, RN as Oncology Nurse Navigator  CHIEF COMPLAINT: Stage IVb ovarian cancer.  INTERVAL HISTORY: Patient returns to clinic today for further evaluation and consideration of cycle 2 of dose reduced carboplatin and Taxol.  She tolerated her first treatment well without significant side effects.  Her weakness and fatigue continue to improve.  She does not complain of shortness of breath today.  She currently feels well. She denies any pain.  She has no neurologic complaints.  She denies any fevers.  She has no chest pain, cough, or hemoptysis.  She denies any abdominal pain.  She does not complain of issues with her colostomy today.  She denies any nausea, vomiting, constipation, or diarrhea.  She has no urinary complaints.  Patient offers no specific complaints today.  REVIEW OF SYSTEMS:   Review of Systems  Constitutional: Negative.  Negative for diaphoresis, fever, malaise/fatigue and weight loss.  Respiratory: Negative.  Negative for cough and shortness of breath.   Cardiovascular: Negative.  Negative for chest pain and leg swelling.  Gastrointestinal: Negative.  Negative for abdominal pain, nausea and vomiting.  Genitourinary: Negative.  Negative for dysuria.  Musculoskeletal: Negative.  Negative for joint pain.  Skin: Negative.  Negative for rash.  Neurological: Negative.  Negative for dizziness, sensory change, weakness and headaches.  Psychiatric/Behavioral: Negative.  The patient is not nervous/anxious.     As per HPI. Otherwise, a complete review of systems is negative.  PAST MEDICAL HISTORY: Past Medical History:  Diagnosis Date  . Anxiety   . Arthritis   . Breast cancer ([place]) 2013    RIGHT lumpectomy  . Bursitis of elbow    RIGHT  . Cancer [facility]) 2013   right breast lumpectomy with a few nodes removed  . Chronic kidney disease    stage 3  . Colitis   . Coronary artery disease   . Diverticulitis   . Dysrhythmia    according to anxiety, tachy  . Family history of breast cancer   . Family history of melanoma   . GERD (gastroesophageal reflux disease)    okay since took her gallbladder out  . H/O lumpectomy   . Heart disease   . History of breast cancer   . History of coronary artery stent placement 2007   one stent  . HOH (hard of hearing)   . Hypercholesteremia   . Hypertension   . Hypothyroidism   . Myocardial infarction ([place])    2007  . Osteopenia   . Palpitations   . Personal history of radiation therapy   . Skin cancer     PAST SURGICAL HISTORY: Past Surgical History:  Procedure Laterality Date  . BREAST LUMPECTOMY Right 2013   lymph node dissection  . CATARACT EXTRACTION W/PHACO Right 07/23/2015   Procedure: CATARACT EXTRACTION PHACO AND INTRAOCULAR LENS PLACEMENT (IOC);  Surgeon: Birder Robson, MD;  Location: [facility] ORS;  Service: Ophthalmology;  Laterality: Right;  [place] 01:24 AP% 26.7 CDE 22.47 fluid pack lot # [id number]  . CATARACT EXTRACTION W/PHACO Left 08/13/2015   Procedure: CATARACT EXTRACTION PHACO AND INTRAOCULAR LENS PLACEMENT (IOC);  Surgeon: Birder Robson, MD;  Location: [facility] ORS;  Service: Ophthalmology;  Laterality: Left;  [place] 02:40 AP% 30.7 CDE 49.28 fluid pack lot # [id number]  .  CHOLECYSTECTOMY    . COLONOSCOPY    . CORONARY ANGIOPLASTY  2007  . EYE SURGERY     bilateral cataract  . HARDWARE REMOVAL Right 04/29/2016   Procedure: HARDWARE REMOVAL;  Surgeon: Hessie Knows, MD;  Location: [facility] ORS;  Service: Orthopedics;  Laterality: Right;  . IR IMAGING GUIDED PORT INSERTION  01/12/2020  . ORIF ELBOW FRACTURE Right 10/01/2015   Procedure: OPEN REDUCTION INTERNAL FIXATION (ORIF) ELBOW/OLECRANON FRACTURE;  Surgeon: Hessie Knows,  MD;  Location: [facility] ORS;  Service: Orthopedics;  Laterality: Right;  . TRANSVERSE LOOP COLOSTOMY N/A 01/26/2020   Procedure: TRANSVERSE LOOP COLOSTOMY;  Surgeon: Ronny Bacon, MD;  Location: [facility];  Service: General;  Laterality: N/A;  . VIDEO BRONCHOSCOPY WITH ENDOBRONCHIAL NAVIGATION N/A 01/01/2020   Procedure: VIDEO BRONCHOSCOPY WITH ENDOBRONCHIAL NAVIGATION WITH CELLZIZIO;  Surgeon: Tyler Pita, MD;  Location: [facility];  Service: Pulmonary;  Laterality: N/A;  . VIDEO BRONCHOSCOPY WITH ENDOBRONCHIAL ULTRASOUND N/A 01/01/2020   Procedure: VIDEO BRONCHOSCOPY WITH ENDOBRONCHIAL ULTRASOUND;  Surgeon: Tyler Pita, MD;  Location: [facility];  Service: Pulmonary;  Laterality: N/A;    FAMILY HISTORY: Reviewed and unchanged. No reported history of malignancy or chronic disease.     ADVANCED DIRECTIVES:    HEALTH MAINTENANCE: Social History   Tobacco Use  . Smoking status: Former Smoker    Packs/day: 0.25    Years: 2.00    Pack years: 0.50    Types: Cigarettes    Quit date: 12/12/1955    Years since quitting: 64.3  . Smokeless tobacco: Never Used  Vaping Use  . Vaping Use: Never used  Substance Use Topics  . Alcohol use: Yes    Comment: wine occasionally  . Drug use: No     Colonoscopy:  PAP:  Bone density:  Lipid panel:  Allergies  Allergen Reactions  . Prednisone Other (See Comments)    Mood alterations    Current Outpatient Medications  Medication Sig Dispense Refill  . acetaminophen (TYLENOL) 500 MG tablet Take 500 mg by mouth every 8 (eight) hours as needed for moderate pain.     Marland Kitchen aspirin EC 81 MG tablet Take 81 mg by mouth at bedtime. Swallow whole.     . carvedilol (COREG) 3.125 MG tablet Take 3.125 mg by mouth 2 (two) times daily.     . Cholecalciferol (VITAMIN D-3 PO) Take 1,000 mg by mouth daily.     . Coenzyme Q10 (CO Q 10 PO) Take 100 mg by mouth daily.     Marland Kitchen levothyroxine (SYNTHROID) 25 MCG tablet Take 25 mcg by mouth daily before breakfast.     . lidocaine-prilocaine (EMLA) cream Apply to affected area once 30 g 3  . mirtazapine (REMERON) 7.5 MG tablet Take 7.5 mg by mouth at bedtime.    . nitroGLYCERIN (NITROSTAT) 0.4 MG SL tablet Place 0.4 mg under the tongue every 5 (five) minutes x 3 doses as needed for chest pain.     . pantoprazole (PROTONIX) 20 MG tablet Take 1 tablet (20 mg total) by mouth daily. 30 tablet 1  . simvastatin (ZOCOR) 40 MG tablet Take 40 mg by mouth at bedtime.      No current facility-administered medications for this visit.    OBJECTIVE: Vitals:   03/28/20 0933  BP: (!) 191/79  Pulse: 86  Resp: 20  SpO2: 100%     Body mass index is 20.62 kg/m.    ECOG FS:0 - Asymptomatic  General: Well-developed, well-nourished, no acute distress. Eyes: Pink conjunctiva,  anicteric sclera. HEENT: Normocephalic, moist mucous membranes. Lungs: No audible wheezing or coughing. Heart: Regular rate and rhythm. Abdomen: Soft, nontender, no obvious distention. Musculoskeletal: No edema, cyanosis, or clubbing. Neuro: Alert, answering all questions appropriately. Cranial nerves grossly intact. Skin: No rashes or petechiae noted. Psych: Normal affect.   LAB RESULTS:  Lab Results  Component Value Date   NA 138 03/28/2020   K 3.6 03/28/2020   CL 106 03/28/2020   CO2 26 03/28/2020   GLUCOSE 110 (H) 03/28/2020   BUN 16 03/28/2020   CREATININE 1.14 (H) 03/28/2020   CALCIUM 8.4 (L) 03/28/2020   PROT 7.1 03/28/2020   ALBUMIN 3.8 03/28/2020   AST 21 03/28/2020   ALT 10 03/28/2020   ALKPHOS 43 03/28/2020   BILITOT 0.4 03/28/2020   GFRNONAA 47 (L) 03/28/2020   GFRAA 58 (L) 02/21/2020    Lab Results  Component Value Date   WBC 8.4 03/28/2020   NEUTROABS 4.1 03/28/2020   HGB 10.4 (L) 03/28/2020   HCT 32.3 (L) 03/28/2020   MCV 95.3 03/28/2020   PLT 209 03/28/2020     STUDIES: No results found.  ASSESSMENT: Stage IVb ovarian cancer.  PLAN:    1.  Stage IVb ovarian cancer: Despite history of breast  cancer, biopsy consistent with carcinoma of gynecologic origin.  PET scan results from December 11, 2019 reviewed independently with large right pelvic mass, extensive retroperitoneal lymphadenopathy and bilateral pulmonary disease consistent with metastatic ovarian cancer.  Appreciate gynecology oncology input.  Patient only received 1 dose of dose reduced carboplatinum and Taxol on January 16, 2020 before requiring surgery for malignant obstruction related to her malignancy.  Patient's CA-125 initially trended down, but now has trended up slightly to 766.  Hospice was previously discussed, but patient declined at this time.  Proceed with cycle 3 of treatment today along with On-Pro Neulasta.  Return to clinic in 3 weeks for further evaluation and consideration of cycle 4. 2.  History of breast cancer: Patient has a history of stage IIb ER/PR positive, upper inner quadrant right breast.  Previously we did not have access to patient's pathology report, her stage and location of breast were determined based on clinic notes.  She received 6 months of Arimidex between April 2013 and October 2013 prior to undergoing right breast lumpectomy. She reports she underwent adjuvant XRT, but did not receive adjuvant chemotherapy.  Patient completed 5 years of Arimidex in October 2018.  Her most recent mammogram on January 30, 2019 was reported as BI-RADS 1.   3.  Anxiety: Improved.  Patient receives IV Ativan with patient's premeds for each treatment.  Continue Xanax as needed at home. 4.  Diverting colostomy: Continue home health care and follow-up with surgery as indicated. 5.  Leukocytosis: Resolved. 6.  Anemia: Chronic and unchanged.  Patient's hemoglobin has trended up slightly to 10.4.  Patient expressed understanding and was in agreement with this plan. She also understands that She can call clinic at any time with any questions, concerns, or complaints.    Lloyd Huger, MD   03/29/2020 9:30 AM

## 2020-03-27 ENCOUNTER — Other Ambulatory Visit: Payer: Self-pay

## 2020-03-27 ENCOUNTER — Encounter: Payer: Medicare PPO | Attending: Internal Medicine | Admitting: Internal Medicine

## 2020-03-28 ENCOUNTER — Inpatient Hospital Stay: Payer: Medicare PPO

## 2020-03-28 ENCOUNTER — Inpatient Hospital Stay (HOSPITAL_BASED_OUTPATIENT_CLINIC_OR_DEPARTMENT_OTHER): Payer: Medicare PPO | Admitting: Hospice and Palliative Medicine

## 2020-03-28 ENCOUNTER — Inpatient Hospital Stay (HOSPITAL_BASED_OUTPATIENT_CLINIC_OR_DEPARTMENT_OTHER): Payer: Medicare PPO | Admitting: Oncology

## 2020-03-28 ENCOUNTER — Encounter: Payer: Self-pay | Admitting: Oncology

## 2020-03-28 VITALS — BP 191/79 | HR 86 | Resp 20 | Wt 120.1 lb

## 2020-03-28 DIAGNOSIS — Z515 Encounter for palliative care: Secondary | ICD-10-CM | POA: Diagnosis not present

## 2020-03-28 DIAGNOSIS — C569 Malignant neoplasm of unspecified ovary: Secondary | ICD-10-CM | POA: Diagnosis not present

## 2020-03-28 DIAGNOSIS — Z5111 Encounter for antineoplastic chemotherapy: Secondary | ICD-10-CM | POA: Diagnosis not present

## 2020-03-28 LAB — CBC WITH DIFFERENTIAL/PLATELET
Abs Immature Granulocytes: 0.02 10*3/uL (ref 0.00–0.07)
Basophils Absolute: 0 10*3/uL (ref 0.0–0.1)
Basophils Relative: 1 %
Eosinophils Absolute: 0.1 10*3/uL (ref 0.0–0.5)
Eosinophils Relative: 1 %
HCT: 32.3 % — ABNORMAL LOW (ref 36.0–46.0)
Hemoglobin: 10.4 g/dL — ABNORMAL LOW (ref 12.0–15.0)
Immature Granulocytes: 0 %
Lymphocytes Relative: 40 %
Lymphs Abs: 3.3 10*3/uL (ref 0.7–4.0)
MCH: 30.7 pg (ref 26.0–34.0)
MCHC: 32.2 g/dL (ref 30.0–36.0)
MCV: 95.3 fL (ref 80.0–100.0)
Monocytes Absolute: 0.8 10*3/uL (ref 0.1–1.0)
Monocytes Relative: 10 %
Neutro Abs: 4.1 10*3/uL (ref 1.7–7.7)
Neutrophils Relative %: 48 %
Platelets: 209 10*3/uL (ref 150–400)
RBC: 3.39 MIL/uL — ABNORMAL LOW (ref 3.87–5.11)
RDW: 15.3 % (ref 11.5–15.5)
WBC: 8.4 10*3/uL (ref 4.0–10.5)
nRBC: 0 % (ref 0.0–0.2)

## 2020-03-28 LAB — COMPREHENSIVE METABOLIC PANEL
ALT: 10 U/L (ref 0–44)
AST: 21 U/L (ref 15–41)
Albumin: 3.8 g/dL (ref 3.5–5.0)
Alkaline Phosphatase: 43 U/L (ref 38–126)
Anion gap: 6 (ref 5–15)
BUN: 16 mg/dL (ref 8–23)
CO2: 26 mmol/L (ref 22–32)
Calcium: 8.4 mg/dL — ABNORMAL LOW (ref 8.9–10.3)
Chloride: 106 mmol/L (ref 98–111)
Creatinine, Ser: 1.14 mg/dL — ABNORMAL HIGH (ref 0.44–1.00)
GFR, Estimated: 47 mL/min — ABNORMAL LOW (ref >60)
Glucose, Bld: 110 mg/dL — ABNORMAL HIGH (ref 70–99)
Potassium: 3.6 mmol/L (ref 3.5–5.1)
Sodium: 138 mmol/L (ref 135–145)
Total Bilirubin: 0.4 mg/dL (ref 0.3–1.2)
Total Protein: 7.1 g/dL (ref 6.5–8.1)

## 2020-03-28 MED ORDER — HEPARIN SOD (PORK) LOCK FLUSH 100 UNIT/ML IV SOLN
INTRAVENOUS | Status: AC
  Filled 2020-03-28: qty 5

## 2020-03-28 MED ORDER — LORAZEPAM 2 MG/ML IJ SOLN
0.5000 mg | Freq: Once | INTRAMUSCULAR | Status: AC
  Administered 2020-03-28: 0.5 mg via INTRAVENOUS
  Filled 2020-03-28: qty 1

## 2020-03-28 MED ORDER — SODIUM CHLORIDE 0.9 % IV SOLN
230.0000 mg | Freq: Once | INTRAVENOUS | Status: AC
  Administered 2020-03-28: 230 mg via INTRAVENOUS
  Filled 2020-03-28: qty 23

## 2020-03-28 MED ORDER — DIPHENHYDRAMINE HCL 50 MG/ML IJ SOLN
25.0000 mg | Freq: Once | INTRAMUSCULAR | Status: AC
  Administered 2020-03-28: 25 mg via INTRAVENOUS
  Filled 2020-03-28: qty 1

## 2020-03-28 MED ORDER — FAMOTIDINE IN NACL 20-0.9 MG/50ML-% IV SOLN
20.0000 mg | Freq: Once | INTRAVENOUS | Status: AC
  Administered 2020-03-28: 20 mg via INTRAVENOUS
  Filled 2020-03-28: qty 50

## 2020-03-28 MED ORDER — PEGFILGRASTIM 6 MG/0.6ML ~~LOC~~ PSKT
6.0000 mg | PREFILLED_SYRINGE | Freq: Once | SUBCUTANEOUS | Status: AC
  Administered 2020-03-28: 6 mg via SUBCUTANEOUS
  Filled 2020-03-28: qty 0.6

## 2020-03-28 MED ORDER — SODIUM CHLORIDE 0.9 % IV SOLN
150.0000 mg | Freq: Once | INTRAVENOUS | Status: AC
  Administered 2020-03-28: 150 mg via INTRAVENOUS
  Filled 2020-03-28: qty 5

## 2020-03-28 MED ORDER — SODIUM CHLORIDE 0.9 % IV SOLN
10.0000 mg | Freq: Once | INTRAVENOUS | Status: AC
  Administered 2020-03-28: 10 mg via INTRAVENOUS
  Filled 2020-03-28: qty 10

## 2020-03-28 MED ORDER — PALONOSETRON HCL INJECTION 0.25 MG/5ML
0.2500 mg | Freq: Once | INTRAVENOUS | Status: AC
  Administered 2020-03-28: 0.25 mg via INTRAVENOUS
  Filled 2020-03-28: qty 5

## 2020-03-28 MED ORDER — HEPARIN SOD (PORK) LOCK FLUSH 100 UNIT/ML IV SOLN
500.0000 [IU] | Freq: Once | INTRAVENOUS | Status: AC | PRN
  Administered 2020-03-28: 500 [IU]
  Filled 2020-03-28: qty 5

## 2020-03-28 MED ORDER — SODIUM CHLORIDE 0.9 % IV SOLN
110.0000 mg/m2 | Freq: Once | INTRAVENOUS | Status: AC
  Administered 2020-03-28: 180 mg via INTRAVENOUS
  Filled 2020-03-28: qty 30

## 2020-03-28 MED ORDER — SODIUM CHLORIDE 0.9 % IV SOLN
Freq: Once | INTRAVENOUS | Status: AC
  Filled 2020-03-28: qty 250

## 2020-03-28 NOTE — Progress Notes (Signed)
Pt tolerated infusion well. No s/s of distress or reaction noted. Pt and VS stable at discharge.  

## 2020-03-28 NOTE — Progress Notes (Signed)
[place]  [phone] [phone]   Name: Sherri [name] Date: 03/28/2020 MRN: [id number]  DOB: 1933/02/05  Patient Care Team: [name], MD as PCP - General (Family Medicine) Lloyd Huger, MD as Consulting Physician (Oncology) Clent Jacks, RN as Oncology Nurse Navigator    REASON FOR CONSULTATION: Sherri [name] is a 86 y.o. female with multiple medical problems including history of stage IIb breast cancer status post lumpectomy and XRT with completion of 5 years of Arimidex in October 2019. She was subsequently diagnosed with stage IV ovarian cancer in June 2021. PET scan on 12/11/2019 revealed diffuse pulmonary metastatic disease with extensive retroperitoneal lymphadenopathy and right ureter obstructed by her pelvic mass with right-sided hydronephrosis. Currently on systemic chemotherapy.  Patient was hospitalized 01/24/2020-01/30/2020 with progressive abdominal pain and distention.  CT of abdomen and pelvis revealed colonic obstruction secondary to her large right pelvic malignancy.  She is s/p diverting loop colostomy on 8/27 for palliation.  Palliative care was consulted to help address goals and manage ongoing symptoms.   SOCIAL HISTORY:     reports that she quit smoking about 64 years ago. Her smoking use included cigarettes. She has a 0.50 pack-year smoking history. She has never used smokeless tobacco. She reports current alcohol use. She reports that she does not use drugs.  Patient is divorced. She lives at home with her brother. She has a son in [place] and another son and daughter who live out of state. Patient has a nephew who is very involved in her care.  Patient previously worked as a Educational psychologist.  ADVANCE DIRECTIVES:  Does not have  CODE STATUS: DNR  PAST MEDICAL HISTORY: Past Medical History:  Diagnosis Date  . Anxiety   . Arthritis   . Breast cancer ([place]) 2013    RIGHT lumpectomy  . Bursitis of elbow    RIGHT  . Cancer [facility]) 2013   right breast lumpectomy with a few nodes removed  . Chronic kidney disease    stage 3  . Colitis   . Coronary artery disease   . Diverticulitis   . Dysrhythmia    according to anxiety, tachy  . Family history of breast cancer   . Family history of melanoma   . GERD (gastroesophageal reflux disease)    okay since took her gallbladder out  . H/O lumpectomy   . Heart disease   . History of breast cancer   . History of coronary artery stent placement 2007   one stent  . HOH (hard of hearing)   . Hypercholesteremia   . Hypertension   . Hypothyroidism   . Myocardial infarction ([place])    2007  . Osteopenia   . Palpitations   . Personal history of radiation therapy   . Skin cancer     PAST SURGICAL HISTORY:  Past Surgical History:  Procedure Laterality Date  . BREAST LUMPECTOMY Right 2013   lymph node dissection  . CATARACT EXTRACTION W/PHACO Right 07/23/2015   Procedure: CATARACT EXTRACTION PHACO AND INTRAOCULAR LENS PLACEMENT (IOC);  Surgeon: Birder Robson, MD;  Location: [facility];  Service: Ophthalmology;  Laterality: Right;  [place] 01:24 AP% 26.7 CDE 22.47 fluid pack lot # [id number]  . CATARACT EXTRACTION W/PHACO Left 08/13/2015   Procedure: CATARACT EXTRACTION PHACO AND INTRAOCULAR LENS PLACEMENT (IOC);  Surgeon: Birder Robson, MD;  Location: [facility];  Service: Ophthalmology;  Laterality: Left;  [place] 02:40 AP% 30.7 CDE 49.28 fluid pack lot #  [id number]  . CHOLECYSTECTOMY    . COLONOSCOPY    . CORONARY ANGIOPLASTY  2007  . EYE SURGERY     bilateral cataract  . HARDWARE REMOVAL Right 04/29/2016   Procedure: HARDWARE REMOVAL;  Surgeon: Hessie Knows, MD;  Location: [facility];  Service: Orthopedics;  Laterality: Right;  . IR IMAGING GUIDED PORT INSERTION  01/12/2020  . ORIF ELBOW FRACTURE Right 10/01/2015   Procedure: OPEN REDUCTION INTERNAL FIXATION (ORIF) ELBOW/OLECRANON FRACTURE;  Surgeon: Hessie Knows,  MD;  Location: [facility];  Service: Orthopedics;  Laterality: Right;  . TRANSVERSE LOOP COLOSTOMY N/A 01/26/2020   Procedure: TRANSVERSE LOOP COLOSTOMY;  Surgeon: Ronny Bacon, MD;  Location: [facility];  Service: General;  Laterality: N/A;  . VIDEO BRONCHOSCOPY WITH ENDOBRONCHIAL NAVIGATION N/A 01/01/2020   Procedure: VIDEO BRONCHOSCOPY WITH ENDOBRONCHIAL NAVIGATION WITH CELLZIZIO;  Surgeon: Tyler Pita, MD;  Location: [facility];  Service: Pulmonary;  Laterality: N/A;  . VIDEO BRONCHOSCOPY WITH ENDOBRONCHIAL ULTRASOUND N/A 01/01/2020   Procedure: VIDEO BRONCHOSCOPY WITH ENDOBRONCHIAL ULTRASOUND;  Surgeon: Tyler Pita, MD;  Location: [facility];  Service: Pulmonary;  Laterality: N/A;    HEMATOLOGY/ONCOLOGY HISTORY:  Oncology History  Ovarian cancer ([place])  01/05/2020 Initial Diagnosis   Ovarian cancer ([place])   01/12/2020 Cancer Staging   Staging form: Ovary, Fallopian Tube, and Primary Peritoneal Carcinoma, AJCC 8th Edition - Clinical: FIGO Stage IVB (cTX, cN1b, pM1b) - Signed by Lloyd Huger, MD on 01/12/2020   01/16/2020 -  Chemotherapy   The patient had palonosetron (ALOXI) injection 0.25 mg, 0.25 mg, Intravenous,  Once, 3 of 6 cycles Administration: 0.25 mg (01/16/2020), 0.25 mg (03/06/2020) pegfilgrastim (NEULASTA ONPRO KIT) injection 6 mg, 6 mg, Subcutaneous, Once, 3 of 6 cycles Administration: 6 mg (01/16/2020) CARBOplatin (PARAPLATIN) 210 mg in sodium chloride 0.9 % 250 mL chemo infusion, 210 mg (103.7 % of original dose 204.4 mg), Intravenous,  Once, 3 of 6 cycles Dose modification:   (original dose 204.4 mg, Cycle 1) Administration: 210 mg (01/16/2020), 250 mg (03/06/2020) fosaprepitant (EMEND) 150 mg in sodium chloride 0.9 % 145 mL IVPB, 150 mg, Intravenous,  Once, 3 of 6 cycles Administration: 150 mg (01/16/2020), 150 mg (03/06/2020) PACLitaxel (TAXOL) 180 mg in sodium chloride 0.9 % 250 mL chemo infusion (> 61m/m2), 110 mg/m2 = 180 mg (100 % of original dose 110 mg/m2),  Intravenous,  Once, 3 of 6 cycles Dose modification: 110 mg/m2 (original dose 110 mg/m2, Cycle 1, Reason: Patient Age) Administration: 180 mg (01/16/2020), 180 mg (03/06/2020)  for chemotherapy treatment.     Genetic Testing   Negative germline and HRD testing. No pathogenic variants identified on the [facility] panel. The report date is 02/26/2020.  The [facility] panel offered by [employer] sequencing and deletion/duplication testing of the following 35 genes: APC, ATM, AXIN2, BARD1, BMPR1A, BRCA1, BRCA2, BRIP1, CHD1, CDK4, CDKN2A, CHEK2, EPCAM (large rearrangement only), HOXB13, GALNT12, MLH1, MSH2, MSH3, MSH6, MUTYH, NBN, NTHL1, PALB2, PMS2, PTEN, RAD51C, RAD51D, RNF43, RPS20, SMAD4, STK11, and TP53. Sequencing was performed for select regions of POLE and POLD1, and large rearrangement analysis was performed for select regions of GREM1.      ALLERGIES:  is allergic to prednisone.  MEDICATIONS:  Current Outpatient Medications  Medication Sig Dispense Refill  . acetaminophen (TYLENOL) 500 MG tablet Take 500 mg by mouth every 8 (eight) hours as needed for moderate pain.     .Marland Kitchenaspirin EC 81 MG tablet Take 81 mg by mouth at bedtime. Swallow whole.     .Marland Kitchen  carvedilol (COREG) 3.125 MG tablet Take 3.125 mg by mouth 2 (two) times daily.     . Cholecalciferol (VITAMIN D-3 PO) Take 1,000 mg by mouth daily.     . Coenzyme Q10 (CO Q 10 PO) Take 100 mg by mouth daily.     Marland Kitchen levothyroxine (SYNTHROID) 25 MCG tablet Take 25 mcg by mouth daily before breakfast.    . lidocaine-prilocaine (EMLA) cream Apply to affected area once 30 g 3  . mirtazapine (REMERON) 7.5 MG tablet Take 7.5 mg by mouth at bedtime.    . nitroGLYCERIN (NITROSTAT) 0.4 MG SL tablet Place 0.4 mg under the tongue every 5 (five) minutes x 3 doses as needed for chest pain.     . pantoprazole (PROTONIX) 20 MG tablet Take 1 tablet (20 mg total) by mouth daily. 30 tablet 1  . simvastatin (ZOCOR) 40 MG  tablet Take 40 mg by mouth at bedtime.      No current facility-administered medications for this visit.   Facility-Administered Medications Ordered in Other Visits  Medication Dose Route Frequency Provider Last Rate Last Admin  . CARBOplatin (PARAPLATIN) 230 mg in sodium chloride 0.9 % 250 mL chemo infusion  230 mg Intravenous Once Lloyd Huger, MD      . dexamethasone (DECADRON) 10 mg in sodium chloride 0.9 % 50 mL IVPB  10 mg Intravenous Once Lloyd Huger, MD      . fosaprepitant (EMEND) 150 mg in sodium chloride 0.9 % 145 mL IVPB  150 mg Intravenous Once Lloyd Huger, MD      . heparin lock flush 100 unit/mL  500 Units Intracatheter Once PRN Lloyd Huger, MD      . PACLitaxel (TAXOL) 180 mg in sodium chloride 0.9 % 250 mL chemo infusion (> 7m/m2)  110 mg/m2 (Treatment Plan Recorded) Intravenous Once FLloyd Huger MD      . pegfilgrastim (NEULASTA ONPRO KIT) injection 6 mg  6 mg Subcutaneous Once FLloyd Huger MD        VITAL SIGNS: There were no vitals taken for this visit. There were no vitals filed for this visit.  Estimated body mass index is 20.62 kg/m as calculated from the following:   Height as of 03/06/20: _0  (1.626 m).   Weight as of an earlier encounter on 03/28/20: 120 lb 1.6 oz (54.5 kg).  LABS: CBC:    Component Value Date/Time   WBC 8.4 03/28/2020 0913   HGB 10.4 (L) 03/28/2020 0913   HCT 32.3 (L) 03/28/2020 0913   PLT 209 03/28/2020 0913   MCV 95.3 03/28/2020 0913   NEUTROABS 4.1 03/28/2020 0913   LYMPHSABS 3.3 03/28/2020 0913   MONOABS 0.8 03/28/2020 0913   EOSABS 0.1 03/28/2020 0913   BASOSABS 0.0 03/28/2020 0913   Comprehensive Metabolic Panel:    Component Value Date/Time   NA 138 03/28/2020 0913   K 3.6 03/28/2020 0913   CL 106 03/28/2020 0913   CO2 26 03/28/2020 0913   BUN 16 03/28/2020 0913   CREATININE 1.14 (H) 03/28/2020 0913   GLUCOSE 110 (H) 03/28/2020 0913   CALCIUM 8.4 (L) 03/28/2020 0913    AST 21 03/28/2020 0913   ALT 10 03/28/2020 0913   ALKPHOS 43 03/28/2020 0913   BILITOT 0.4 03/28/2020 0913   PROT 7.1 03/28/2020 0913   ALBUMIN 3.8 03/28/2020 0913    RADIOGRAPHIC STUDIES: No results found.  PERFORMANCE STATUS (ECOG) : 2 - Symptomatic, <50% confined to bed  Review of Systems  Unless otherwise noted, a complete review of systems is negative.  Physical Exam General: NAD, frail appearing Pulmonary: Unlabored Extremities: no edema, no joint deformities Skin: no rashes Neurological: Weakness but otherwise nonfocal  IMPRESSION: Routine follow-up visit.  Patient seen in infusion area.  Patient reports she is doing well.  She denies any significant changes or concerns.  No symptomatic complaints today.  She reports appetite is stable.  She was having significant difficulty with managing her colostomy but she says this is improved after use of a new colostomy device.  She denies any recent leakage.  PLAN: -Continue current scope of treatment -RTC in 3 weeks   Patient expressed understanding and was in agreement with this plan. She also understands that She can call the clinic at any time with any questions, concerns, or complaints.     Time Total: 15 minutes  Visit consisted of counseling and education dealing with the complex and emotionally intense issues of symptom management and palliative care in the setting of serious and potentially life-threatening illness.Greater than 50%  of this time was spent counseling and coordinating care related to the above assessment and plan.  Signed by: Altha Harm, PhD, NP-C

## 2020-03-29 LAB — CA 125: Cancer Antigen (CA) 125: 766 U/mL — ABNORMAL HIGH (ref 0.0–38.1)

## 2020-04-01 ENCOUNTER — Telehealth: Payer: Self-pay

## 2020-04-01 NOTE — Telephone Encounter (Signed)
1030AM: Palliative care SW outreached patients son, [name], per NP request.  Son shared that there has been discussion about LTC placement for patient due to her decline and progressing cancer. Son shared that he is still discussing these options with patient and family, but that he knows patient will more than likely stay in the area of [place]. SW discussed LTC Medicaid options and guidelines with son and will e-mail medicaid information to son to look over and discuss further with family. No other concerns for SW at this. SW provided contact information for assistance with long term placement.

## 2020-04-13 NOTE — Progress Notes (Signed)
[place]  [phone] [phone]  ID: Sherri [name] OB: 20-Aug-1932  MR#: [id number]  XVQ#:[id number]  Patient Care Team: [name], MD as PCP - General (Family Medicine) Lloyd Huger, MD as Consulting Physician (Oncology) Clent Jacks, RN as Oncology Nurse Navigator  CHIEF COMPLAINT: Stage IVb ovarian cancer.  INTERVAL HISTORY: Patient returns to clinic today for further evaluation and consideration of cycle 4 of dose reduced carboplatinum and Taxol.  She is tolerating her treatments well without significant side effects.  She does not complain of weakness or fatigue today.  She denies any pain.  She has no neurologic complaints.  She denies any fevers.  She has no chest pain, shortness of breath, cough, or hemoptysis.  She denies any abdominal pain.  She does not complain of issues with her colostomy today.  She denies any nausea, vomiting, constipation, or diarrhea.  She has no urinary complaints.  Patient offers no specific complaints today.  REVIEW OF SYSTEMS:   Review of Systems  Constitutional: Negative.  Negative for diaphoresis, fever, malaise/fatigue and weight loss.  Respiratory: Negative.  Negative for cough and shortness of breath.   Cardiovascular: Negative.  Negative for chest pain and leg swelling.  Gastrointestinal: Negative.  Negative for abdominal pain, nausea and vomiting.  Genitourinary: Negative.  Negative for dysuria.  Musculoskeletal: Negative.  Negative for joint pain.  Skin: Negative.  Negative for rash.  Neurological: Negative.  Negative for dizziness, sensory change, weakness and headaches.  Psychiatric/Behavioral: Negative.  The patient is not nervous/anxious.     As per HPI. Otherwise, a complete review of systems is negative.  PAST MEDICAL HISTORY: Past Medical History:  Diagnosis Date   Anxiety    Arthritis    Breast cancer ([place]) 2013   RIGHT lumpectomy   Bursitis of elbow     RIGHT   Cancer [facility]) 2013   right breast lumpectomy with a few nodes removed   Chronic kidney disease    stage 3   Colitis    Coronary artery disease    Diverticulitis    Dysrhythmia    according to anxiety, tachy   Family history of breast cancer    Family history of melanoma    GERD (gastroesophageal reflux disease)    okay since took her gallbladder out   H/O lumpectomy    Heart disease    History of breast cancer    History of coronary artery stent placement 2007   one stent   HOH (hard of hearing)    Hypercholesteremia    Hypertension    Hypothyroidism    Myocardial infarction [facility])    2007   Osteopenia    Palpitations    Personal history of radiation therapy    Skin cancer     PAST SURGICAL HISTORY: Past Surgical History:  Procedure Laterality Date   BREAST LUMPECTOMY Right 2013   lymph node dissection   CATARACT EXTRACTION W/PHACO Right 07/23/2015   Procedure: CATARACT EXTRACTION PHACO AND INTRAOCULAR LENS PLACEMENT (IOC);  Surgeon: Birder Robson, MD;  Location: [facility] ORS;  Service: Ophthalmology;  Laterality: Right;  [place] 01:24 AP% 26.7 CDE 22.47 fluid pack lot # [id number]   CATARACT EXTRACTION W/PHACO Left 08/13/2015   Procedure: CATARACT EXTRACTION PHACO AND INTRAOCULAR LENS PLACEMENT (IOC);  Surgeon: Birder Robson, MD;  Location: [facility] ORS;  Service: Ophthalmology;  Laterality: Left;  [place] 02:40 AP% 30.7 CDE 49.28 fluid pack lot # [id number]   CHOLECYSTECTOMY     COLONOSCOPY  CORONARY ANGIOPLASTY  2007   EYE SURGERY     bilateral cataract   HARDWARE REMOVAL Right 04/29/2016   Procedure: HARDWARE REMOVAL;  Surgeon: Hessie Knows, MD;  Location: [facility] ORS;  Service: Orthopedics;  Laterality: Right;   IR IMAGING GUIDED PORT INSERTION  01/12/2020   ORIF ELBOW FRACTURE Right 10/01/2015   Procedure: OPEN REDUCTION INTERNAL FIXATION (ORIF) ELBOW/OLECRANON FRACTURE;  Surgeon: Hessie Knows, MD;  Location: [facility] ORS;  Service:  Orthopedics;  Laterality: Right;   TRANSVERSE LOOP COLOSTOMY N/A 01/26/2020   Procedure: TRANSVERSE LOOP COLOSTOMY;  Surgeon: Ronny Bacon, MD;  Location: [facility] ORS;  Service: General;  Laterality: N/A;   VIDEO BRONCHOSCOPY WITH ENDOBRONCHIAL NAVIGATION N/A 01/01/2020   Procedure: VIDEO BRONCHOSCOPY WITH ENDOBRONCHIAL NAVIGATION WITH CELLZIZIO;  Surgeon: Tyler Pita, MD;  Location: [facility] ORS;  Service: Pulmonary;  Laterality: N/A;   VIDEO BRONCHOSCOPY WITH ENDOBRONCHIAL ULTRASOUND N/A 01/01/2020   Procedure: VIDEO BRONCHOSCOPY WITH ENDOBRONCHIAL ULTRASOUND;  Surgeon: Tyler Pita, MD;  Location: [facility] ORS;  Service: Pulmonary;  Laterality: N/A;    FAMILY HISTORY: Reviewed and unchanged. No reported history of malignancy or chronic disease.     ADVANCED DIRECTIVES:    HEALTH MAINTENANCE: Social History   Tobacco Use   Smoking status: Former Smoker    Packs/day: 0.25    Years: 2.00    Pack years: 0.50    Types: Cigarettes    Quit date: 12/12/1955    Years since quitting: 64.4   Smokeless tobacco: Never Used  Vaping Use   Vaping Use: Never used  Substance Use Topics   Alcohol use: Yes    Comment: wine occasionally   Drug use: No     Colonoscopy:  PAP:  Bone density:  Lipid panel:  Allergies  Allergen Reactions   Prednisone Other (See Comments)    Mood alterations    Current Outpatient Medications  Medication Sig Dispense Refill   acetaminophen (TYLENOL) 500 MG tablet Take 500 mg by mouth every 8 (eight) hours as needed for moderate pain.      aspirin EC 81 MG tablet Take 81 mg by mouth at bedtime. Swallow whole.      carvedilol (COREG) 3.125 MG tablet Take 3.125 mg by mouth 2 (two) times daily.      Cholecalciferol (VITAMIN D-3 PO) Take 1,000 mg by mouth daily.      Coenzyme Q10 (CO Q 10 PO) Take 100 mg by mouth daily.      levothyroxine (SYNTHROID) 25 MCG tablet Take 25 mcg by mouth daily before breakfast.     lidocaine-prilocaine (EMLA)  cream Apply to affected area once 30 g 3   mirtazapine (REMERON) 7.5 MG tablet Take 7.5 mg by mouth at bedtime.     nitroGLYCERIN (NITROSTAT) 0.4 MG SL tablet Place 0.4 mg under the tongue every 5 (five) minutes x 3 doses as needed for chest pain.      pantoprazole (PROTONIX) 20 MG tablet Take 1 tablet (20 mg total) by mouth daily. 30 tablet 1   simvastatin (ZOCOR) 40 MG tablet Take 40 mg by mouth at bedtime.      No current facility-administered medications for this visit.    OBJECTIVE: Vitals:   04/18/20 0930  BP: 113/86  Pulse: 79  Temp: (!) 97.4 F (36.3 C)  SpO2: 97%     Body mass index is 20.39 kg/m.    ECOG FS:0 - Asymptomatic  General: Well-developed, well-nourished, no acute distress. Eyes: Pink conjunctiva, anicteric sclera. HEENT: Normocephalic, moist mucous membranes.  Lungs: No audible wheezing or coughing. Heart: Regular rate and rhythm. Abdomen: Soft, nontender, no obvious distention. Musculoskeletal: No edema, cyanosis, or clubbing. Neuro: Alert, answering all questions appropriately. Cranial nerves grossly intact. Skin: No rashes or petechiae noted. Psych: Normal affect.   LAB RESULTS:  Lab Results  Component Value Date   NA 136 04/18/2020   K 3.9 04/18/2020   CL 102 04/18/2020   CO2 26 04/18/2020   GLUCOSE 102 (H) 04/18/2020   BUN 28 (H) 04/18/2020   CREATININE 0.99 04/18/2020   CALCIUM 9.0 04/18/2020   PROT 7.5 04/18/2020   ALBUMIN 4.0 04/18/2020   AST 22 04/18/2020   ALT 11 04/18/2020   ALKPHOS 49 04/18/2020   BILITOT 0.4 04/18/2020   GFRNONAA 56 (L) 04/18/2020   GFRAA 58 (L) 02/21/2020    Lab Results  Component Value Date   WBC 11.0 (H) 04/18/2020   NEUTROABS 6.7 04/18/2020   HGB 10.2 (L) 04/18/2020   HCT 31.4 (L) 04/18/2020   MCV 97.8 04/18/2020   PLT 238 04/18/2020     STUDIES: No results found.  ASSESSMENT: Stage IVb ovarian cancer.  PLAN:    1.  Stage IVb ovarian cancer: Despite history of breast cancer, biopsy  consistent with carcinoma of gynecologic origin.  PET scan results from December 11, 2019 reviewed independently with large right pelvic mass, extensive retroperitoneal lymphadenopathy and bilateral pulmonary disease consistent with metastatic ovarian cancer.  Appreciate gynecology oncology input.  Patient only received 1 dose of dose reduced carboplatinum and Taxol on January 16, 2020 before requiring surgery for malignant obstruction related to her malignancy.  Patient's CA-125 remains essentially unchanged at 708.0.  Proceed with cycle 4 of treatment today.  Return to clinic in 4 weeks with repeat imaging using PET scan and further evaluation.  2.  History of breast cancer: Patient has a history of stage IIb ER/PR positive, upper inner quadrant right breast.  Previously we did not have access to patient's pathology report, her stage and location of breast were determined based on clinic notes.  She received 6 months of Arimidex between April 2013 and October 2013 prior to undergoing right breast lumpectomy. She reports she underwent adjuvant XRT, but did not receive adjuvant chemotherapy.  Patient completed 5 years of Arimidex in October 2018.  Her most recent mammogram on January 30, 2019 was reported as BI-RADS 1.    3.  Anxiety: Improved.  Patient receives IV Ativan with patient's premeds for each treatment.  Continue Xanax as needed at home. 4.  Diverting colostomy: Continue home health care and follow-up with surgery as indicated. 5.  Leukocytosis: Resolved. 6.  Anemia: Chronic and unchanged.  Patient's hemoglobin is 10.2 today.  I spent a total of 30 minutes reviewing chart data, face-to-face evaluation with the patient, counseling and coordination of care as detailed above.   Patient expressed understanding and was in agreement with this plan. She also understands that She can call clinic at any time with any questions, concerns, or complaints.    Lloyd Huger, MD   04/20/2020 8:29  AM

## 2020-04-18 ENCOUNTER — Encounter: Payer: Self-pay | Admitting: Oncology

## 2020-04-18 ENCOUNTER — Inpatient Hospital Stay (HOSPITAL_BASED_OUTPATIENT_CLINIC_OR_DEPARTMENT_OTHER): Payer: Medicare PPO | Admitting: Hospice and Palliative Medicine

## 2020-04-18 ENCOUNTER — Inpatient Hospital Stay: Payer: Medicare PPO | Attending: Oncology

## 2020-04-18 ENCOUNTER — Inpatient Hospital Stay: Payer: Medicare PPO

## 2020-04-18 ENCOUNTER — Other Ambulatory Visit: Payer: Self-pay

## 2020-04-18 ENCOUNTER — Inpatient Hospital Stay: Payer: Medicare PPO | Admitting: Oncology

## 2020-04-18 VITALS — BP 113/86 | HR 79 | Temp 97.4°F | Wt 118.8 lb

## 2020-04-18 DIAGNOSIS — Z515 Encounter for palliative care: Secondary | ICD-10-CM

## 2020-04-18 DIAGNOSIS — C569 Malignant neoplasm of unspecified ovary: Secondary | ICD-10-CM

## 2020-04-18 DIAGNOSIS — F419 Anxiety disorder, unspecified: Secondary | ICD-10-CM | POA: Diagnosis not present

## 2020-04-18 DIAGNOSIS — D649 Anemia, unspecified: Secondary | ICD-10-CM | POA: Insufficient documentation

## 2020-04-18 DIAGNOSIS — Z5111 Encounter for antineoplastic chemotherapy: Secondary | ICD-10-CM | POA: Diagnosis not present

## 2020-04-18 DIAGNOSIS — Z853 Personal history of malignant neoplasm of breast: Secondary | ICD-10-CM | POA: Insufficient documentation

## 2020-04-18 DIAGNOSIS — Z79899 Other long term (current) drug therapy: Secondary | ICD-10-CM | POA: Diagnosis not present

## 2020-04-18 DIAGNOSIS — Z87891 Personal history of nicotine dependence: Secondary | ICD-10-CM | POA: Insufficient documentation

## 2020-04-18 LAB — CBC WITH DIFFERENTIAL/PLATELET
Abs Immature Granulocytes: 0.03 10*3/uL (ref 0.00–0.07)
Basophils Absolute: 0.1 10*3/uL (ref 0.0–0.1)
Basophils Relative: 1 %
Eosinophils Absolute: 0.1 10*3/uL (ref 0.0–0.5)
Eosinophils Relative: 1 %
HCT: 31.4 % — ABNORMAL LOW (ref 36.0–46.0)
Hemoglobin: 10.2 g/dL — ABNORMAL LOW (ref 12.0–15.0)
Immature Granulocytes: 0 %
Lymphocytes Relative: 26 %
Lymphs Abs: 2.8 10*3/uL (ref 0.7–4.0)
MCH: 31.8 pg (ref 26.0–34.0)
MCHC: 32.5 g/dL (ref 30.0–36.0)
MCV: 97.8 fL (ref 80.0–100.0)
Monocytes Absolute: 1.2 10*3/uL — ABNORMAL HIGH (ref 0.1–1.0)
Monocytes Relative: 11 %
Neutro Abs: 6.7 10*3/uL (ref 1.7–7.7)
Neutrophils Relative %: 61 %
Platelets: 238 10*3/uL (ref 150–400)
RBC: 3.21 MIL/uL — ABNORMAL LOW (ref 3.87–5.11)
RDW: 17.2 % — ABNORMAL HIGH (ref 11.5–15.5)
WBC: 11 10*3/uL — ABNORMAL HIGH (ref 4.0–10.5)
nRBC: 0 % (ref 0.0–0.2)

## 2020-04-18 LAB — COMPREHENSIVE METABOLIC PANEL
ALT: 11 U/L (ref 0–44)
AST: 22 U/L (ref 15–41)
Albumin: 4 g/dL (ref 3.5–5.0)
Alkaline Phosphatase: 49 U/L (ref 38–126)
Anion gap: 8 (ref 5–15)
BUN: 28 mg/dL — ABNORMAL HIGH (ref 8–23)
CO2: 26 mmol/L (ref 22–32)
Calcium: 9 mg/dL (ref 8.9–10.3)
Chloride: 102 mmol/L (ref 98–111)
Creatinine, Ser: 0.99 mg/dL (ref 0.44–1.00)
GFR, Estimated: 56 mL/min — ABNORMAL LOW (ref >60)
Glucose, Bld: 102 mg/dL — ABNORMAL HIGH (ref 70–99)
Potassium: 3.9 mmol/L (ref 3.5–5.1)
Sodium: 136 mmol/L (ref 135–145)
Total Bilirubin: 0.4 mg/dL (ref 0.3–1.2)
Total Protein: 7.5 g/dL (ref 6.5–8.1)

## 2020-04-18 MED ORDER — SODIUM CHLORIDE 0.9 % IV SOLN
150.0000 mg | Freq: Once | INTRAVENOUS | Status: AC
  Administered 2020-04-18: 150 mg via INTRAVENOUS
  Filled 2020-04-18: qty 150

## 2020-04-18 MED ORDER — SODIUM CHLORIDE 0.9 % IV SOLN
110.0000 mg/m2 | Freq: Once | INTRAVENOUS | Status: AC
  Administered 2020-04-18: 180 mg via INTRAVENOUS
  Filled 2020-04-18: qty 30

## 2020-04-18 MED ORDER — SODIUM CHLORIDE 0.9 % IV SOLN
10.0000 mg | Freq: Once | INTRAVENOUS | Status: AC
  Administered 2020-04-18: 10 mg via INTRAVENOUS
  Filled 2020-04-18: qty 10

## 2020-04-18 MED ORDER — SODIUM CHLORIDE 0.9% FLUSH
10.0000 mL | Freq: Once | INTRAVENOUS | Status: AC
  Administered 2020-04-18: 10 mL via INTRAVENOUS
  Filled 2020-04-18: qty 10

## 2020-04-18 MED ORDER — HEPARIN SOD (PORK) LOCK FLUSH 100 UNIT/ML IV SOLN
500.0000 [IU] | Freq: Once | INTRAVENOUS | Status: AC
  Administered 2020-04-18: 500 [IU] via INTRAVENOUS
  Filled 2020-04-18: qty 5

## 2020-04-18 MED ORDER — PEGFILGRASTIM 6 MG/0.6ML ~~LOC~~ PSKT
6.0000 mg | PREFILLED_SYRINGE | Freq: Once | SUBCUTANEOUS | Status: AC
  Administered 2020-04-18: 6 mg via SUBCUTANEOUS
  Filled 2020-04-18: qty 0.6

## 2020-04-18 MED ORDER — PALONOSETRON HCL INJECTION 0.25 MG/5ML
0.2500 mg | Freq: Once | INTRAVENOUS | Status: AC
  Administered 2020-04-18: 0.25 mg via INTRAVENOUS
  Filled 2020-04-18: qty 5

## 2020-04-18 MED ORDER — LORAZEPAM 2 MG/ML IJ SOLN
0.5000 mg | Freq: Once | INTRAMUSCULAR | Status: AC
  Administered 2020-04-18: 0.5 mg via INTRAVENOUS
  Filled 2020-04-18: qty 1

## 2020-04-18 MED ORDER — SODIUM CHLORIDE 0.9 % IV SOLN
Freq: Once | INTRAVENOUS | Status: AC
  Filled 2020-04-18: qty 250

## 2020-04-18 MED ORDER — HEPARIN SOD (PORK) LOCK FLUSH 100 UNIT/ML IV SOLN
INTRAVENOUS | Status: AC
  Filled 2020-04-18: qty 5

## 2020-04-18 MED ORDER — FAMOTIDINE IN NACL 20-0.9 MG/50ML-% IV SOLN
20.0000 mg | Freq: Once | INTRAVENOUS | Status: AC
  Administered 2020-04-18: 20 mg via INTRAVENOUS
  Filled 2020-04-18: qty 50

## 2020-04-18 MED ORDER — SODIUM CHLORIDE 0.9 % IV SOLN
230.0000 mg | Freq: Once | INTRAVENOUS | Status: AC
  Administered 2020-04-18: 230 mg via INTRAVENOUS
  Filled 2020-04-18: qty 23

## 2020-04-18 MED ORDER — DIPHENHYDRAMINE HCL 50 MG/ML IJ SOLN
25.0000 mg | Freq: Once | INTRAMUSCULAR | Status: AC
  Administered 2020-04-18: 25 mg via INTRAVENOUS
  Filled 2020-04-18: qty 1

## 2020-04-18 NOTE — Progress Notes (Signed)
Patient denies new problems/concerns today.   °

## 2020-04-18 NOTE — Progress Notes (Signed)
[place]  Telephone:([phone] [phone]   Name: CLARECE DRZEWIECKI Date: 04/18/2020 MRN: [id number]  DOB: 1932-11-22  Patient Care Team: [name], MD as PCP - General (Family Medicine) Lloyd Huger, MD as Consulting Physician (Oncology) Clent Jacks, RN as Oncology Nurse Navigator    REASON FOR CONSULTATION: Sherri [name] is a 86 y.o. female with multiple medical problems including history of stage IIb breast cancer status post lumpectomy and XRT with completion of 5 years of Arimidex in October 2019. She was subsequently diagnosed with stage IV ovarian cancer in June 2021. PET scan on 12/11/2019 revealed diffuse pulmonary metastatic disease with extensive retroperitoneal lymphadenopathy and right ureter obstructed by her pelvic mass with right-sided hydronephrosis. Currently on systemic chemotherapy.  Patient was hospitalized 01/24/2020-01/30/2020 with progressive abdominal pain and distention.  CT of abdomen and pelvis revealed colonic obstruction secondary to her large right pelvic malignancy.  She is s/p diverting loop colostomy on 8/27 for palliation.  Palliative care was consulted to help address goals and manage ongoing symptoms.   SOCIAL HISTORY:     reports that she quit smoking about 64 years ago. Her smoking use included cigarettes. She has a 0.50 pack-year smoking history. She has never used smokeless tobacco. She reports current alcohol use. She reports that she does not use drugs.  Patient is divorced. She lives at home with her brother. She has a son in [place] and another son and daughter who live out of state. Patient has a nephew who is very involved in her care.  Patient previously worked as a Educational psychologist.  ADVANCE DIRECTIVES:  Does not have  CODE STATUS: DNR  PAST MEDICAL HISTORY: Past Medical History:  Diagnosis Date  . Anxiety   . Arthritis   . Breast cancer ([place]) 2013    RIGHT lumpectomy  . Bursitis of elbow    RIGHT  . Cancer [facility]) 2013   right breast lumpectomy with a few nodes removed  . Chronic kidney disease    stage 3  . Colitis   . Coronary artery disease   . Diverticulitis   . Dysrhythmia    according to anxiety, tachy  . Family history of breast cancer   . Family history of melanoma   . GERD (gastroesophageal reflux disease)    okay since took her gallbladder out  . H/O lumpectomy   . Heart disease   . History of breast cancer   . History of coronary artery stent placement 2007   one stent  . HOH (hard of hearing)   . Hypercholesteremia   . Hypertension   . Hypothyroidism   . Myocardial infarction ([place])    2007  . Osteopenia   . Palpitations   . Personal history of radiation therapy   . Skin cancer     PAST SURGICAL HISTORY:  Past Surgical History:  Procedure Laterality Date  . BREAST LUMPECTOMY Right 2013   lymph node dissection  . CATARACT EXTRACTION W/PHACO Right 07/23/2015   Procedure: CATARACT EXTRACTION PHACO AND INTRAOCULAR LENS PLACEMENT (IOC);  Surgeon: Birder Robson, MD;  Location: [facility] ORS;  Service: Ophthalmology;  Laterality: Right;  [place] 01:24 AP% 26.7 CDE 22.47 fluid pack lot # [id number]  . CATARACT EXTRACTION W/PHACO Left 08/13/2015   Procedure: CATARACT EXTRACTION PHACO AND INTRAOCULAR LENS PLACEMENT (IOC);  Surgeon: Birder Robson, MD;  Location: [facility] ORS;  Service: Ophthalmology;  Laterality: Left;  [place] 02:40 AP% 30.7 CDE 49.28 fluid pack lot #  [id number]  . CHOLECYSTECTOMY    . COLONOSCOPY    . CORONARY ANGIOPLASTY  2007  . EYE SURGERY     bilateral cataract  . HARDWARE REMOVAL Right 04/29/2016   Procedure: HARDWARE REMOVAL;  Surgeon: Hessie Knows, MD;  Location: [facility] ORS;  Service: Orthopedics;  Laterality: Right;  . IR IMAGING GUIDED PORT INSERTION  01/12/2020  . ORIF ELBOW FRACTURE Right 10/01/2015   Procedure: OPEN REDUCTION INTERNAL FIXATION (ORIF) ELBOW/OLECRANON FRACTURE;  Surgeon: Hessie Knows,  MD;  Location: [facility] OR[facility];  Service: Orthopedics;  Laterality: Right;  . TRANSVERSE LOOP COLOSTOMY N/A 01/26/2020   Procedure: TRANSVERSE LOOP COLOSTOMY;  Surgeon: Ronny Bacon, MD;  Location: [facility] OR[facility];  Service: General;  Laterality: N/A;  . VIDEO BRONCHOSCOPY WITH ENDOBRONCHIAL NAVIGATION N/A 01/01/2020   Procedure: VIDEO BRONCHOSCOPY WITH ENDOBRONCHIAL NAVIGATION WITH CELLZIZIO;  Surgeon: Tyler Pita, MD;  Location: [facility] OR[facility];  Service: Pulmonary;  Laterality: N/A;  . VIDEO BRONCHOSCOPY WITH ENDOBRONCHIAL ULTRASOUND N/A 01/01/2020   Procedure: VIDEO BRONCHOSCOPY WITH ENDOBRONCHIAL ULTRASOUND;  Surgeon: Tyler Pita, MD;  Location: [facility] ORS;  Service: Pulmonary;  Laterality: N/A;    HEMATOLOGY/ONCOLOGY HISTORY:  Oncology History  Ovarian cancer ([place])  01/05/2020 Initial Diagnosis   Ovarian cancer ([place])   01/12/2020 Cancer Staging   Staging form: Ovary, Fallopian Tube, and Primary Peritoneal Carcinoma, AJCC 8th Edition - Clinical: FIGO Stage IVB (cTX, cN1b, pM1b) - Signed by Lloyd Huger, MD on 01/12/2020   01/16/2020 -  Chemotherapy   The patient had palonosetron (ALOXI) injection 0.25 mg, 0.25 mg, Intravenous,  Once, 4 of 6 cycles Administration: 0.25 mg (01/16/2020), 0.25 mg (03/06/2020), 0.25 mg (03/28/2020) pegfilgrastim (NEULASTA ONPRO KIT) injection 6 mg, 6 mg, Subcutaneous, Once, 4 of 6 cycles Administration: 6 mg (01/16/2020), 6 mg (03/28/2020) CARBOplatin (PARAPLATIN) 210 mg in sodium chloride 0.9 % 250 mL chemo infusion, 210 mg (103.7 % of original dose 204.4 mg), Intravenous,  Once, 4 of 6 cycles Dose modification:   (original dose 204.4 mg, Cycle 1) Administration: 210 mg (01/16/2020), 250 mg (03/06/2020), 230 mg (03/28/2020) fosaprepitant (EMEND) 150 mg in sodium chloride 0.9 % 145 mL IVPB, 150 mg, Intravenous,  Once, 4 of 6 cycles Administration: 150 mg (01/16/2020), 150 mg (03/06/2020), 150 mg (03/28/2020) PACLitaxel (TAXOL) 180 mg in sodium chloride 0.9 % 250 mL  chemo infusion (> 79m/m2), 110 mg/m2 = 180 mg (100 % of original dose 110 mg/m2), Intravenous,  Once, 4 of 6 cycles Dose modification: 110 mg/m2 (original dose 110 mg/m2, Cycle 1, Reason: Patient Age) Administration: 180 mg (01/16/2020), 180 mg (03/06/2020), 180 mg (03/28/2020)  for chemotherapy treatment.     Genetic Testing   Negative germline and HRD testing. No pathogenic variants identified on the [facility] myChoice panel. The report date is 02/26/2020.  The [facility] panel offered by [employer] [employer] sequencing and deletion/duplication testing of the following 35 genes: APC, ATM, AXIN2, BARD1, BMPR1A, BRCA1, BRCA2, BRIP1, CHD1, CDK4, CDKN2A, CHEK2, EPCAM (large rearrangement only), HOXB13, GALNT12, MLH1, MSH2, MSH3, MSH6, MUTYH, NBN, NTHL1, PALB2, PMS2, PTEN, RAD51C, RAD51D, RNF43, RPS20, SMAD4, STK11, and TP53. Sequencing was performed for select regions of POLE and POLD1, and large rearrangement analysis was performed for select regions of GREM1.      ALLERGIES:  is allergic to prednisone.  MEDICATIONS:  Current Outpatient Medications  Medication Sig Dispense Refill  . acetaminophen (TYLENOL) 500 MG tablet Take 500 mg by mouth every 8 (eight) hours as needed for moderate pain.     .Marland Kitchen  aspirin EC 81 MG tablet Take 81 mg by mouth at bedtime. Swallow whole.     . carvedilol (COREG) 3.125 MG tablet Take 3.125 mg by mouth 2 (two) times daily.     . Cholecalciferol (VITAMIN D-3 PO) Take 1,000 mg by mouth daily.     . Coenzyme Q10 (CO Q 10 PO) Take 100 mg by mouth daily.     Marland Kitchen levothyroxine (SYNTHROID) 25 MCG tablet Take 25 mcg by mouth daily before breakfast.    . lidocaine-prilocaine (EMLA) cream Apply to affected area once 30 g 3  . mirtazapine (REMERON) 7.5 MG tablet Take 7.5 mg by mouth at bedtime.    . nitroGLYCERIN (NITROSTAT) 0.4 MG SL tablet Place 0.4 mg under the tongue every 5 (five) minutes x 3 doses as needed for chest pain.     . pantoprazole  (PROTONIX) 20 MG tablet Take 1 tablet (20 mg total) by mouth daily. 30 tablet 1  . simvastatin (ZOCOR) 40 MG tablet Take 40 mg by mouth at bedtime.      No current facility-administered medications for this visit.   Facility-Administered Medications Ordered in Other Visits  Medication Dose Route Frequency Provider Last Rate Last Admin  . CARBOplatin (PARAPLATIN) 230 mg in sodium chloride 0.9 % 250 mL chemo infusion  230 mg Intravenous Once Lloyd Huger, MD      . heparin lock flush 100 unit/mL  500 Units Intravenous Once Lloyd Huger, MD      . PACLitaxel (TAXOL) 180 mg in sodium chloride 0.9 % 250 mL chemo infusion (> 64m/m2)  110 mg/m2 (Treatment Plan Recorded) Intravenous Once FLloyd Huger MD 93 mL/hr at 04/18/20 1204 180 mg at 04/18/20 1204  . pegfilgrastim (NEULASTA ONPRO KIT) injection 6 mg  6 mg Subcutaneous Once FLloyd Huger MD        VITAL SIGNS: There were no vitals taken for this visit. There were no vitals filed for this visit.  Estimated body mass index is 20.39 kg/m as calculated from the following:   Height as of 03/06/20: '5\' 4"'  (1.626 m).   Weight as of an earlier encounter on 04/18/20: 118 lb 12.8 oz (53.9 kg).  LABS: CBC:    Component Value Date/Time   WBC 11.0 (H) 04/18/2020 0911   HGB 10.2 (L) 04/18/2020 0911   HCT 31.4 (L) 04/18/2020 0911   PLT 238 04/18/2020 0911   MCV 97.8 04/18/2020 0911   NEUTROABS 6.7 04/18/2020 0911   LYMPHSABS 2.8 04/18/2020 0911   MONOABS 1.2 (H) 04/18/2020 0911   EOSABS 0.1 04/18/2020 0911   BASOSABS 0.1 04/18/2020 0911   Comprehensive Metabolic Panel:    Component Value Date/Time   NA 136 04/18/2020 0911   K 3.9 04/18/2020 0911   CL 102 04/18/2020 0911   CO2 26 04/18/2020 0911   BUN 28 (H) 04/18/2020 0911   CREATININE 0.99 04/18/2020 0911   GLUCOSE 102 (H) 04/18/2020 0911   CALCIUM 9.0 04/18/2020 0911   AST 22 04/18/2020 0911   ALT 11 04/18/2020 0911   ALKPHOS 49 04/18/2020 0911   BILITOT  0.4 04/18/2020 0911   PROT 7.5 04/18/2020 0911   ALBUMIN 4.0 04/18/2020 0911    RADIOGRAPHIC STUDIES: No results found.  PERFORMANCE STATUS (ECOG) : 2 - Symptomatic, <50% confined to bed  Review of Systems Unless otherwise noted, a complete review of systems is negative.  Physical Exam General: NAD, frail appearing Pulmonary: Unlabored Extremities: no edema, no joint deformities Skin: no rashes Neurological:  Weakness but otherwise nonfocal  IMPRESSION: Routine follow-up visit.  Patient seen in infusion area.  Patient reports that she is doing well.  She denies any significant changes or concerns.  No symptomatic complaints at present.  She reports that her issues with the colostomy leaking have resolved.  She says that her oral intake is good.  She did have some questions regarding her medications but she has a print out list and plans to speak with her family who are managing her medications at home.  PLAN: -Continue current scope of treatment -RTC in 3 weeks   Patient expressed understanding and was in agreement with this plan. She also understands that She can call the clinic at any time with any questions, concerns, or complaints.     Time Total: 15 minutes  Visit consisted of counseling and education dealing with the complex and emotionally intense issues of symptom management and palliative care in the setting of serious and potentially life-threatening illness.Greater than 50%  of this time was spent counseling and coordinating care related to the above assessment and plan.  Signed by: Altha Harm, PhD, NP-C

## 2020-04-19 LAB — CA 125: Cancer Antigen (CA) 125: 708 U/mL — ABNORMAL HIGH (ref 0.0–38.1)

## 2020-04-23 ENCOUNTER — Other Ambulatory Visit: Payer: Medicare PPO | Admitting: Nurse Practitioner

## 2020-04-23 ENCOUNTER — Encounter: Payer: Self-pay | Admitting: Nurse Practitioner

## 2020-04-23 ENCOUNTER — Other Ambulatory Visit: Payer: Self-pay

## 2020-04-23 DIAGNOSIS — Z515 Encounter for palliative care: Secondary | ICD-10-CM

## 2020-04-23 DIAGNOSIS — R5381 Other malaise: Secondary | ICD-10-CM

## 2020-04-23 NOTE — Progress Notes (Signed)
[facility] Consult Note Telephone: [phone]  Fax: [phone]  PATIENT NAME: Sherri [name] DOB: [birth year] MRN: [id number]  PRIMARY CARE PROVIDER:   [name], MD  REFERRING PROVIDER:  [name], MD [phone] [address] [place] [facility] [place],  [place] [address]  RESPONSIBLE PARTY:Primary Contact: Sherri [name] [phone]  Sherri [name] (Niece) cell: [phone] Sherri [name] (patient's brother and Gina's Dad): [phone]  1.Advance Care Planning;DNR in [place];   2. Debility/generalized weakness; discussed at length with family about STR for PT/OT; colostomy care under skilled days with possibility transition to LTC.  3. Goals of Care: Goals include to maximize quality of life and symptom management. Our advance care planning conversation included a discussion about:   The value and importance of advance care planning  Exploration of personal, cultural or spiritual beliefs that might influence medical decisions  Exploration of goals of care in the event of a sudden injury or illness  Identification and preparation of a healthcare agent  Review and updating or creation of anadvance directive document.  4.Palliative care encounter; Palliative care encounter; Palliative medicine team will continue to support patient, patient's family, and medical team. Visit consisted of counseling and education dealing with the complex and emotionally intense issues of symptom management and palliative care in the setting of serious and potentially life-threatening illness  5. f/u4 weeks for further goc, ,monitoring and assistance for skilled placement  I spent 65 minutes providing this consultation,  from 9:30am to 10:35am. More than 50% of the time in this consultation was spent coordinating communication.   HISTORY OF PRESENT ILLNESS:  Sherri [name] is a 86 y.o. year  old female with multiple medical problems including history of stage IIb breast cancer status post lumpectomy and XRT with completion of 5 years of Arimidex in October 2019. She was subsequently diagnosed with stage IV ovarian cancer in June 2021. In person visit for f/u PC with Sherri [name], Sherri [name] (Sherri [name] brother with whom she lives with) and Sherri [name] (nephew). We talked about purpose of PC f/u visit for complex medical decision making, symptoms of pain which Sherri [name] denies. Appetite which is improving. We talked about appointment for ostomy care, Sherri [name] endorses there was not much more that can be done about the leaking she was told. Discussion of chemotherapy, Oncology f/u visit which [name] is tolerating with currently no side effects. We talked about Oncology plan per last visit with Sherri [name]  Sherri [name] plan from 04/18/2020 ASSESSMENT: Stage IVb ovarian cancer.  PLAN:    1.  Stage IVb ovarian cancer: Despite history of breast cancer, biopsy consistent with carcinoma of gynecologic origin.  PET scan results from December 11, 2019 reviewed independently with large right pelvic mass, extensive retroperitoneal lymphadenopathy and bilateral pulmonary disease consistent with metastatic ovarian cancer.  Appreciate gynecology oncology input.  Patient only received 1 dose of dose reduced carboplatinum and Taxol on January 16, 2020 before requiring surgery for malignant obstruction related to her malignancy.  Patient's CA-125 remains essentially unchanged at 708.0.  Proceed with cycle 4 of treatment today.  Return to clinic in 4 weeks with repeat imaging using PET scan and further evaluation.  2.  History of breast cancer: Patient has a history of stage IIb ER/PR positive, upper inner quadrant right breast.  Previously we did not have access to patient's pathology report, her stage and location of breast were determined based on clinic notes.  She received 6 months of Arimidex between April  2013 and October 2013 prior to undergoing right breast lumpectomy. She reports she underwent adjuvant XRT, but did not receive adjuvant chemotherapy.  Patient completed 5 years of Arimidex in October 2018.  Her most recent mammogram on January 30, 2019 was reported as BI-RADS 1.    3.  Anxiety: Improved.  Patient receives IV Ativan with patient's premeds for each treatment.  Continue Xanax as needed at [name]. 4.  Diverting colostomy: Continue [name] health care and follow-up with surgery as indicated. 5.  Leukocytosis: Resolved. 6.  Anemia: Chronic and unchanged.  Patient's hemoglobin is 10.2 today.  We talked about HCPOA wishing for it to be Sherri [name], Sherri [name]'s Sherri will try to get completed the next time at the cancer center. Also discussed STR for generalized weakness PT; colostomy care skilled need for week of 05/17/2020, Sherri [name] and family will be out of town for the week. Discussed at length about facility placement either temporary while family is away with possibility to transition to permanent placement. Discussed options of ALF, names given in addition to SNF. Sherri [name] endorses he will look into these facilities, also contact Sherri [name] for update, workout financial end. Sherri [name] will re-contact once decided and details worked out to get [employer] completed. Will wait to make a f/u PC visit 4 weeks to see if Sherri [name] will be at Sherri [name] or facility. Sherri [name] in agreement with plan of care. Therapeutic listening, emotional support provided. Questions answered to satisfaction. I called and updated Sherri Chang NP Palliative at [facility].   Palliative Care was asked to help address goals of care.   CODE STATUS:   PPS: 0% HOSPICE ELIGIBILITY/DIAGNOSIS: TBD  PAST MEDICAL HISTORY:  Past Medical History:  Diagnosis Date  . Anxiety   . Arthritis   . Breast cancer ([place]) 2013   RIGHT lumpectomy  . Bursitis of elbow    RIGHT  . Cancer [facility]) 2013   right breast lumpectomy with a few nodes removed    . Chronic kidney disease    stage 3  . Colitis   . Coronary artery disease   . Diverticulitis   . Dysrhythmia    according to anxiety, tachy  . Family history of breast cancer   . Family history of melanoma   . GERD (gastroesophageal reflux disease)    okay since took her gallbladder out  . H/O lumpectomy   . Heart disease   . History of breast cancer   . History of coronary artery stent placement 2007   one stent  . HOH (hard of hearing)   . Hypercholesteremia   . Hypertension   . Hypothyroidism   . Myocardial infarction ([place])    2007  . Osteopenia   . Palpitations   . Personal history of radiation therapy   . Skin cancer     SOCIAL HX:  Social History   Tobacco Use  . Smoking status: Former Smoker    Packs/day: 0.25    Years: 2.00    Pack years: 0.50    Types: Cigarettes    Quit date: 12/12/1955    Years since quitting: 64.4  . Smokeless tobacco: Never Used  Substance Use Topics  . Alcohol use: Yes    Comment: wine occasionally    ALLERGIES:  Allergies  Allergen Reactions  . Prednisone Other (See Comments)    Mood alterations     PERTINENT MEDICATIONS:  Outpatient Encounter Medications as of 04/23/2020  Medication Sig  . acetaminophen (TYLENOL) 500 MG tablet Take 500 mg by mouth every 8 (eight) hours as needed for moderate pain.   Marland Kitchen aspirin EC 81 MG tablet Take 81 mg by mouth at bedtime. Swallow whole.   . carvedilol (COREG) 3.125 MG tablet Take 3.125 mg by mouth 2 (two) times daily.   . Cholecalciferol (VITAMIN D-3 PO) Take 1,000 mg by mouth daily.   . Coenzyme Q10 (CO Q 10 PO) Take 100 mg by mouth daily.   Marland Kitchen levothyroxine (SYNTHROID) 25 MCG tablet Take 25 mcg by mouth daily before breakfast.  . lidocaine-prilocaine (EMLA) cream Apply to affected area once  . mirtazapine (REMERON) 7.5 MG tablet Take 7.5 mg by mouth at bedtime.  . nitroGLYCERIN (NITROSTAT) 0.4 MG SL tablet Place 0.4 mg under the tongue every 5 (five) minutes x 3 doses as needed for  chest pain.   . pantoprazole (PROTONIX) 20 MG tablet Take 1 tablet (20 mg total) by mouth daily.  . simvastatin (ZOCOR) 40 MG tablet Take 40 mg by mouth at bedtime.    No facility-administered encounter medications on file as of 04/23/2020.    PHYSICAL EXAM:   General: NAD, frail appearing, thin, female Cardiovascular: regular rate and rhythm Pulmonary: clear ant fields Neurological: generalized weakness  Haya Hemler Ihor Gully, NP

## 2020-04-23 NOTE — Addendum Note (Signed)
Addended by: Shawn Stall on: 04/23/2020 02:58 PM   Modules accepted: Level of Service

## 2020-05-01 ENCOUNTER — Other Ambulatory Visit: Payer: Self-pay

## 2020-05-01 ENCOUNTER — Inpatient Hospital Stay: Payer: Medicare PPO | Attending: Obstetrics and Gynecology | Admitting: Obstetrics and Gynecology

## 2020-05-01 VITALS — BP 179/80 | HR 83 | Temp 97.3°F | Resp 16 | Wt 116.7 lb

## 2020-05-01 DIAGNOSIS — Z515 Encounter for palliative care: Secondary | ICD-10-CM | POA: Diagnosis not present

## 2020-05-01 DIAGNOSIS — Z7982 Long term (current) use of aspirin: Secondary | ICD-10-CM | POA: Diagnosis not present

## 2020-05-01 DIAGNOSIS — N183 Chronic kidney disease, stage 3 unspecified: Secondary | ICD-10-CM | POA: Insufficient documentation

## 2020-05-01 DIAGNOSIS — Z933 Colostomy status: Secondary | ICD-10-CM | POA: Insufficient documentation

## 2020-05-01 DIAGNOSIS — R4182 Altered mental status, unspecified: Secondary | ICD-10-CM | POA: Insufficient documentation

## 2020-05-01 DIAGNOSIS — Z170 Estrogen receptor positive status [ER+]: Secondary | ICD-10-CM | POA: Diagnosis not present

## 2020-05-01 DIAGNOSIS — Z87891 Personal history of nicotine dependence: Secondary | ICD-10-CM | POA: Insufficient documentation

## 2020-05-01 DIAGNOSIS — I712 Thoracic aortic aneurysm, without rupture: Secondary | ICD-10-CM | POA: Diagnosis not present

## 2020-05-01 DIAGNOSIS — D649 Anemia, unspecified: Secondary | ICD-10-CM | POA: Insufficient documentation

## 2020-05-01 DIAGNOSIS — F419 Anxiety disorder, unspecified: Secondary | ICD-10-CM | POA: Diagnosis not present

## 2020-05-01 DIAGNOSIS — Z7189 Other specified counseling: Secondary | ICD-10-CM | POA: Diagnosis not present

## 2020-05-01 DIAGNOSIS — I129 Hypertensive chronic kidney disease with stage 1 through stage 4 chronic kidney disease, or unspecified chronic kidney disease: Secondary | ICD-10-CM | POA: Insufficient documentation

## 2020-05-01 DIAGNOSIS — J849 Interstitial pulmonary disease, unspecified: Secondary | ICD-10-CM | POA: Insufficient documentation

## 2020-05-01 DIAGNOSIS — C771 Secondary and unspecified malignant neoplasm of intrathoracic lymph nodes: Secondary | ICD-10-CM | POA: Diagnosis not present

## 2020-05-01 DIAGNOSIS — Z79899 Other long term (current) drug therapy: Secondary | ICD-10-CM | POA: Diagnosis not present

## 2020-05-01 DIAGNOSIS — C7800 Secondary malignant neoplasm of unspecified lung: Secondary | ICD-10-CM | POA: Insufficient documentation

## 2020-05-01 DIAGNOSIS — I728 Aneurysm of other specified arteries: Secondary | ICD-10-CM | POA: Diagnosis not present

## 2020-05-01 DIAGNOSIS — C569 Malignant neoplasm of unspecified ovary: Secondary | ICD-10-CM

## 2020-05-01 DIAGNOSIS — I2510 Atherosclerotic heart disease of native coronary artery without angina pectoris: Secondary | ICD-10-CM | POA: Insufficient documentation

## 2020-05-01 DIAGNOSIS — D72829 Elevated white blood cell count, unspecified: Secondary | ICD-10-CM | POA: Diagnosis not present

## 2020-05-01 DIAGNOSIS — I700 Atherosclerosis of aorta: Secondary | ICD-10-CM | POA: Diagnosis not present

## 2020-05-01 DIAGNOSIS — Z853 Personal history of malignant neoplasm of breast: Secondary | ICD-10-CM | POA: Diagnosis not present

## 2020-05-01 NOTE — Progress Notes (Signed)
[facility]  [phone] [phone]  Gynecologic Oncology Interval Note  ID: Sherri [name] OB: 20-Mar-1933  MR#: [id number]  OHY#:[id number]  Patient Care Team: [name], MD as PCP - General (Family Medicine) Lloyd Huger, MD as Consulting Physician (Oncology) Clent Jacks, RN as Oncology Nurse Navigator  CHIEF COMPLAINT: Stage IV ovarian cancer.  INTERVAL HISTORY: Sherri [name] is a pleasant patient with h/o breast cancer and diagnosis of stage IV ovarian cancer referred by Dr. [name] for second opionion.   Since she was last seen she has started chemotherapy and underwent surgery for metastatic bowel obstruction.   01/16/2020 Cycle #1 dose reduced carboplatinum and Taxol.   01/23/2020 Abdominal US.  IMPRESSION: No evidence for ascites. Chronic right hydronephrosis.  01/24/2020  She presented to the ER with abdominal pain and diagnosed with colonic MBO.   01/24/2020 CT C/P IMPRESSION: 1. Distal colonic obstruction associated with colitis presumably due to metastatic disease to the surface of the sigmoid colon. Signs of colitis with bowel distension of the colon up to 8 cm. GI and or surgical consultation may be helpful. 2. Mass in the RIGHT hemipelvis obstructs the RIGHT ureter. Chronic thinning of the RIGHT renal cortex in the setting of severe RIGHT-sided hydro ureteral nephrosis. Mass also involves the RIGHT urinary bladder, the uterus and tethers small-bowel loops in the pelvis and adjacent sigmoid colon. 3. Extension of mass into the RIGHT pelvic sidewall as described. 4. Findings that suggest more diffuse peritoneal involvement. A component of the ascitic fluid could also be due to colonic inflammation. 5. Evidence of pulmonary metastatic disease and interstitial lung disease. 6. Signs of pulmonary metastatic disease and interstitial lung disease. 7. Aortic atherosclerosis  01/25/2020  CXR Known pulmonary  metastases with superimposed interstitial lung disease.  01/26/2020  Loop colostomy creation, transverse colon.  03/06/2020 Cycle #2 dose reduced carboplatinum and Taxol 110 mg/m2 CA125 674 03/28/2020 Cycle #3 dose reduced carboplatinum and Taxol 110 mg/m2  CA125 766 04/18/2020 Cycle 4 of dose reduced carboplatinum and Taxol 110 mg/m2 CA125 708  Negative germline and somatic testing. No pathogenic variants identified on the [facility] + [facility] panel. The report date is 02/26/2020.She had MyRisk testing and BRACAnalysisCDx.   The [facility] gene panel offered by [employer] includes sequencing and deletion/duplication testing of the following 35 genes: APC, ATM, AXIN2, BARD1, BMPR1A, BRCA1, BRCA2, BRIP1, CHD1, CDK4, CDKN2A, CHEK2, EPCAM (large rearrangement only), HOXB13, GALNT12, MLH1, MSH2, MSH3, MSH6, MUTYH, NBN, NTHL1, PALB2, PMS2, PTEN, RAD51C, RAD51D, RNF43, RPS20, SMAD4, STK11, and TP53. Sequencing was performed for select regions of POLE and POLD1, and large rearrangement analysis was performed for select regions of GREM1.  She has a PET scheduled for 05/14/2020  She is doing better, but does not like the colostomy. She had no significant complaints. She is scheduled to see Dr. [name] on 05/16/2020   Gynecologic Oncology History  Sherri [name] is a pleasant patient with h/o breast cancer and diagnosis of stage IV ovarian cancer referred by Dr. [name] for second opionion.   She has a history of stage IIb ER/PR positive upper inner quadrant right breast cancer status post lumpectomy and radiation therapy in 2013. The patient received Arimidex 6 months prior to her lumpectomy and adjuvant XRT afterwards. She did not receive chemotherapy. Patient completed 5 years of Arimidex in October 2018. Mammogram was in August 2020 at that time no evidence of recurrence was noted.   She presented with 1 to 2 months as noted  increased shortness of breath where she could hardly  walk due to the sensation of breathlessness and right lower extremity edema. She underwent evaluation by [facility] including the following:    11/16/2019 Chest CT  IMPRESSION: 1. Widespread perilymphatic pulmonary parenchymal and mediastinal/hilar adenopathy are indicative of metastatic disease. Question breast primary. 2. Suspect underlying interstitial lung disease, poorly characterized given extensive nodularity and lack of high-resolution imaging. 3. Ascending Aortic aneurysm NOS (ICD10-I71.9). Recommend annual imaging followup by CTA or MRA. This recommendation follows 2010 ACCF/AHA/AATS/ACR/ASA/SCA/SCAI/SIR/STS/SVM Guidelines for the Diagnosis and Management of Patients with Thoracic Aortic Disease. Circulation. 2010; 121: N170-Y174. Aortic aneurysm NOS (ICD10-I71.9). 4. Calcified splenic artery aneurysm. 5. Aortic atherosclerosis (ICD10-I70.0). Coronary artery calcification. 6. Enlarged pulmonic trunk, indicative of pulmonary arterial hypertension.  11/13/2019 Ultrasound RLE IMPRESSION: No evidence of deep venous thrombosis of the right lower extremity. Nonspecific subcutaneous edema of the right calf.  11/30/2019 Ultrasound bilateral LE Duplex IMPRESSION: Nonocclusive mild peripheral atherosclerosis bilaterally with biphasic waveforms throughout both lower extremities without significant velocity abnormality.  12/11/2019 PET IMPRESSION: 1. Diffuse pulmonary metastatic disease and extensive mediastinal and hilar lymphadenopathy. The primary lesion is most likely in the right pelvis and could be ovarian or uterine in origin. Associated extensive retroperitoneal lymphadenopathy. 2. The right ureter is obstructed by the right pelvic mass with chronic right-sided hydroureteronephrosis and marked parenchymal thinning of the right kidney. 3. No findings suspicious for osseous metastatic disease.  11/28/2019 CA 27-29 322.7  01/01/2020 Virtual Bronchoscopy with Multi-planar Image  analysis, 3-D reconstruction of coronal, sagittal and multi-planar images for the purposes of planning real-time bronchoscopy using the i[employer] Electromagnetic Navigation Bronchoscopy System (superDimension).  Endobronchial ultrasound with TBNA.  Pathology: DIAGNOSIS:  A. LUNG, LEFT LOWER LOBE; BIOPSY: - POORLY DIFFERENTIATED CARCINOMA; SEE COMMENT.   Comment:  The tumor cells are positive for Pax8 and ER (patchy weak). They are  negative for GATA3, TTF1, and p40. These features (along with the  clinical and imaging findings) are consistent with a metastatic  carcinoma of gynecologic origin. Clinical correlation is recommended.   01/08/2020 CA125 1,002  Dr. [name] recommended chemotherapy with paclitaxel and carboplatin.   PET scan showed right hydroureteronephrosis upstream to the mass with almost complete right renal atrophy and minimal remaining parenchyma. Kidney function has been stable with egfr of 30-39.   She saw Dr. Diamantina Providence who did not feel there was a role for ureteral stent or nephrostomy tube and did not feel likely to have improvement in her renal function given that the right kidney was nearly completely atrophic with almost no remaining parenchyma.   She presents for today for second opinion and we recommended NACT.   PAST MEDICAL HISTORY: Past Medical History:  Diagnosis Date  . Anxiety   . Arthritis   . Breast cancer ([place]) 2013   RIGHT lumpectomy  . Bursitis of elbow    RIGHT  . Cancer [facility]) 2013   right breast lumpectomy with a few nodes removed  . Chronic kidney disease    stage 3  . Colitis   . Coronary artery disease   . Diverticulitis   . Dysrhythmia    according to anxiety, tachy  . Family history of breast cancer   . Family history of melanoma   . GERD (gastroesophageal reflux disease)    okay since took her gallbladder out  . H/O lumpectomy   . Heart disease   . History of breast cancer   . History of coronary artery stent placement 2007   one  stent  . HOH (hard of hearing)   . Hypercholesteremia   . Hypertension   . Hypothyroidism   . Myocardial infarction ([place])    2007  . Osteopenia   . Palpitations   . Personal history of radiation therapy   . Skin cancer     PAST SURGICAL HISTORY: Past Surgical History:  Procedure Laterality Date  . BREAST LUMPECTOMY Right 2013   lymph node dissection  . CATARACT EXTRACTION W/PHACO Right 07/23/2015   Procedure: CATARACT EXTRACTION PHACO AND INTRAOCULAR LENS PLACEMENT (IOC);  Surgeon: Birder Robson, MD;  Location: [facility] ORS;  Service: Ophthalmology;  Laterality: Right;  [place] 01:24 AP% 26.7 CDE 22.47 fluid pack lot # [id number]  . CATARACT EXTRACTION W/PHACO Left 08/13/2015   Procedure: CATARACT EXTRACTION PHACO AND INTRAOCULAR LENS PLACEMENT (IOC);  Surgeon: Birder Robson, MD;  Location: [facility] ORS;  Service: Ophthalmology;  Laterality: Left;  [place] 02:40 AP% 30.7 CDE 49.28 fluid pack lot # [id number]  . CHOLECYSTECTOMY    . COLONOSCOPY    . CORONARY ANGIOPLASTY  2007  . EYE SURGERY     bilateral cataract  . HARDWARE REMOVAL Right 04/29/2016   Procedure: HARDWARE REMOVAL;  Surgeon: Hessie Knows, MD;  Location: [facility] ORS;  Service: Orthopedics;  Laterality: Right;  . IR IMAGING GUIDED PORT INSERTION  01/12/2020  . ORIF ELBOW FRACTURE Right 10/01/2015   Procedure: OPEN REDUCTION INTERNAL FIXATION (ORIF) ELBOW/OLECRANON FRACTURE;  Surgeon: Hessie Knows, MD;  Location: [facility] ORS;  Service: Orthopedics;  Laterality: Right;  . TRANSVERSE LOOP COLOSTOMY N/A 01/26/2020   Procedure: TRANSVERSE LOOP COLOSTOMY;  Surgeon: Ronny Bacon, MD;  Location: [facility] ORS;  Service: General;  Laterality: N/A;  . VIDEO BRONCHOSCOPY WITH ENDOBRONCHIAL NAVIGATION N/A 01/01/2020   Procedure: VIDEO BRONCHOSCOPY WITH ENDOBRONCHIAL NAVIGATION WITH CELLZIZIO;  Surgeon: Tyler Pita, MD;  Location: [facility] ORS;  Service: Pulmonary;  Laterality: N/A;  . VIDEO BRONCHOSCOPY WITH ENDOBRONCHIAL ULTRASOUND N/A 01/01/2020    Procedure: VIDEO BRONCHOSCOPY WITH ENDOBRONCHIAL ULTRASOUND;  Surgeon: Tyler Pita, MD;  Location: [facility] ORS;  Service: Pulmonary;  Laterality: N/A;    HEALTH MAINTENANCE: Social History   Tobacco Use  . Smoking status: Former Smoker    Packs/day: 0.25    Years: 2.00    Pack years: 0.50    Types: Cigarettes    Quit date: 12/12/1955    Years since quitting: 64.4  . Smokeless tobacco: Never Used  Vaping Use  . Vaping Use: Never used  Substance Use Topics  . Alcohol use: Yes    Comment: wine occasionally  . Drug use: No    Allergies  Allergen Reactions  . Prednisone Other (See Comments)    Mood alterations    Current Outpatient Medications  Medication Sig Dispense Refill  . acetaminophen (TYLENOL) 500 MG tablet Take 500 mg by mouth every 8 (eight) hours as needed for moderate pain.     Marland Kitchen aspirin EC 81 MG tablet Take 81 mg by mouth at bedtime. Swallow whole.     . carvedilol (COREG) 3.125 MG tablet Take 3.125 mg by mouth 2 (two) times daily.     . Cholecalciferol (VITAMIN D-3 PO) Take 1,000 mg by mouth daily.     . Coenzyme Q10 (CO Q 10 PO) Take 100 mg by mouth daily.     Marland Kitchen levothyroxine (SYNTHROID) 25 MCG tablet Take 25 mcg by mouth daily before breakfast.    . lidocaine-prilocaine (EMLA) cream Apply to affected area once 30 g 3  . mirtazapine (REMERON) 7.5 MG tablet  Take 7.5 mg by mouth at bedtime.    . nitroGLYCERIN (NITROSTAT) 0.4 MG SL tablet Place 0.4 mg under the tongue every 5 (five) minutes x 3 doses as needed for chest pain.     . pantoprazole (PROTONIX) 20 MG tablet Take 1 tablet (20 mg total) by mouth daily. 30 tablet 1  . simvastatin (ZOCOR) 40 MG tablet Take 40 mg by mouth at bedtime.      No current facility-administered medications for this visit.    OBJECTIVE: Vitals:   05/01/20 1611  BP: (!) 179/80  Pulse: 83  Resp: 16  Temp: (!) 97.3 F (36.3 C)  SpO2: 95%     Body mass index is 20.03 kg/m.     GENERAL: Frail appearing HEENT:  Atraumatic  normocephalic ABDOMEN:  Soft, nontender, nondistended, no ascites/masses/hernias. The stoma is well appearing and pink. Stool is present SKIN:  Clear with no obvious rashes or skin changes.  NEURO:  Nonfocal. Well oriented.  Appropriate affect.  Pelvic: Chaperoned by NP deferred 01/10/2020. Sherri [name] declined pelvic exam  EGBUS: no lesions Cervix: very firm to palpation Vagina:  Lesions right fornix and posterior vagina that is very firm to palpation. No active bleeding Uterus: normal size, nontender but seems adherent with the mass.  BME:  palpable mass RLQ with extension to the right vaginal wall and to the side wall with limited mobility. The cervix is very firm on palpation and seems infiltrated with tumor.  Rectovaginal: confirmatory there may be a mass compressing on the rectosigmoid about the level of the exam.    LAB RESULTS:  Lab Results  Component Value Date   NA 136 04/18/2020   K 3.9 04/18/2020   CL 102 04/18/2020   CO2 26 04/18/2020   GLUCOSE 102 (H) 04/18/2020   BUN 28 (H) 04/18/2020   CREATININE 0.99 04/18/2020   CALCIUM 9.0 04/18/2020   PROT 7.5 04/18/2020   ALBUMIN 4.0 04/18/2020   AST 22 04/18/2020   ALT 11 04/18/2020   ALKPHOS 49 04/18/2020   BILITOT 0.4 04/18/2020   GFRNONAA 56 (L) 04/18/2020   GFRAA 58 (L) 02/21/2020    Lab Results  Component Value Date   WBC 11.0 (H) 04/18/2020   NEUTROABS 6.7 04/18/2020   HGB 10.2 (L) 04/18/2020   HCT 31.4 (L) 04/18/2020   MCV 97.8 04/18/2020   PLT 238 04/18/2020     STUDIES: As per HPI  ASSESSMENT: Stage IV ovarian cancer with widely metastatic disease. Right hydronephrosis with atrophic kidney.Stable/slightly increasing CA125. Unable to assess response at this time as she declines pelvic exam.   Anemia, asymptomatic  Hypertension, asymptomatic  Malignant neoplasm of ovary, unspecified laterality ([place])  Counseling and coordination of care   PLAN:   She will follow up with PET and Dr. [name] as  scheduled. I recommended continuation of paclitaxel and carboplatin if she has stable disease or response. When she was initially seen she was not a surgical candidate. I recommended a pelvic exam and she is agreeable to this when we see her again in January. She is very interested in colostomy reversal.   If her PET indicates progression then consider alternative treatment options.   She has had negative genetic testing and negative BRCA somatic testing. She has not had HRD testing. If she has evidence of response she may be a candidate for PARPi maintenance.   Continue to follow up with her PCP for other medical issues.   A total of 30 minutes were spent with  the patient/family today; >50% was spent in education, counseling and coordination of care for ovarian/tubal/peritoneal cancer. Complex decision making required.   I personally saw the patient and performed this encounter including the exam and counseling independently. Beckey Rutter, NP scribed a portion of the note.   Davyn Morandi Gaetana Michaelis, MD   05/01/2020 4:25 PM

## 2020-05-02 ENCOUNTER — Telehealth: Payer: Self-pay

## 2020-05-02 NOTE — Telephone Encounter (Signed)
Sherri [name] had some questions regarding her visit with Dr. Theora Gianotti yesterday. Called and spoke with [name], as requested, and all questions answered.

## 2020-05-02 NOTE — Progress Notes (Signed)
Update 05/02/2020: [employer] HRD ordered today, expected to report out in 14 days or less. This is a correction to the previous version of this note which said HRD was negative. Will update the note with HRD results when they are back.

## 2020-05-08 ENCOUNTER — Encounter: Payer: Self-pay | Admitting: Oncology

## 2020-05-09 NOTE — NC FL2 (Signed)
[place] LEVEL OF CARE SCREENING TOOL     IDENTIFICATION  Patient Name: Sherri [name] Birthdate: 04-17-1933 Sex: female Admission Date (Current Location): (Not on file)  [place] and [place] Number:  Engineering geologist and Address:  [facility], [address] [place], [place] [address]      Provider Number: [id number]  Attending Physician Name and Address:  Santiago Glad Philbert Riser,*  Relative Name and Phone Number:       Current Level of Care: SNF Recommended Level of Care: [place] Prior Approval Number:    Date Approved/Denied:   PASRR Number: [id number]  Discharge Plan: SNF    Current Diagnoses: Patient Active Problem List   Diagnosis Date Noted  . Genetic testing 03/13/2020  . Status post colostomy ([place]) 02/08/2020  . Protein-calorie malnutrition, severe 01/29/2020  . Colonic obstruction ([place]) 01/25/2020  . Palliative care encounter   . Colitis 01/24/2020  . CKD (chronic kidney disease) stage 3, GFR 30-59 ml/min (HCC) 01/24/2020  . Family history of breast cancer   . Family history of melanoma   . History of breast cancer   . Goals of care, counseling/discussion 01/08/2020  . Ovarian cancer ([place]) 01/05/2020  . Mediastinal lymphadenopathy 11/28/2019  . Bilateral carotid artery stenosis 07/21/2017  . Bradycardia 07/21/2017  . Dizziness 01/18/2017  . Primary cancer of upper inner quadrant of right female breast ([place]) 06/08/2016  . Moderate mitral insufficiency 04/06/2016  . CAD (coronary artery disease) 03/09/2016  . Non-ST elevation myocardial infarction (NSTEMI), subendocardial infarction, subsequent episode of care ([place]) 03/09/2016  . Age related osteoporosis 01/24/2016  . Benign essential hypertension 01/24/2016  . Breast cancer, right breast ([place]) 01/24/2016  . GERD without esophagitis 01/24/2016  . Pure hypercholesterolemia 01/24/2016  . Vitamin B12 deficiency 01/24/2016    Orientation  RESPIRATION BLADDER Height & Weight     Self,Time,Situation,Place  Normal Incontinent Weight: 52.9 kg Height:     BEHAVIORAL SYMPTOMS/MOOD NEUROLOGICAL BOWEL NUTRITION STATUS      Colostomy Diet (Regular)  AMBULATORY STATUS COMMUNICATION OF NEEDS Skin   Limited Assist Verbally Normal                       Personal Care Assistance Level of Assistance  Bathing,Feeding,Dressing Bathing Assistance: Limited assistance Feeding assistance: Limited assistance Dressing Assistance: Limited assistance     Functional Limitations Info             SPECIAL CARE FACTORS FREQUENCY  PT (By licensed PT),OT (By licensed OT)     PT Frequency: evaluate at facility OT Frequency: evaluate at facility            Contractures Contractures Info: Not present    Additional Factors Info  Code Status,Allergies Code Status Info: DNR Allergies Info: Prednisone           Current Medications (05/09/2020):  This is the current hospital active medication list Current Outpatient Medications  Medication Sig Dispense Refill  . acetaminophen (TYLENOL) 500 MG tablet Take 500 mg by mouth every 8 (eight) hours as needed for moderate pain.     Marland Kitchen aspirin EC 81 MG tablet Take 81 mg by mouth at bedtime. Swallow whole.     . carvedilol (COREG) 3.125 MG tablet Take 3.125 mg by mouth 2 (two) times daily.     . Cholecalciferol (VITAMIN D-3 PO) Take 1,000 mg by mouth daily.     . Coenzyme Q10 (CO Q 10 PO) Take 100 mg by  mouth daily.     Marland Kitchen levothyroxine (SYNTHROID) 25 MCG tablet Take 25 mcg by mouth daily before breakfast.    . lidocaine-prilocaine (EMLA) cream Apply to affected area once 30 g 3  . mirtazapine (REMERON) 7.5 MG tablet Take 7.5 mg by mouth at bedtime.    . nitroGLYCERIN (NITROSTAT) 0.4 MG SL tablet Place 0.4 mg under the tongue every 5 (five) minutes x 3 doses as needed for chest pain.     . pantoprazole (PROTONIX) 20 MG tablet Take 1 tablet (20 mg total) by mouth daily. 30 tablet 1  .  simvastatin (ZOCOR) 40 MG tablet Take 40 mg by mouth at bedtime.      No current facility-administered medications for this visit.     Discharge Medications: Please see discharge summary for a list of discharge medications.  Relevant Imaging Results:  Relevant Lab Results:   Additional Information SS# [id number]  Shelbie Hutching, RN

## 2020-05-11 NOTE — Progress Notes (Deleted)
[facility]  [phone] Fax:(336) [phone]  ID: Sherri [name] OB: 1932/09/20  MR#: [id number]  FYB#:[id number]  Patient Care Team: [name], MD as PCP - General (Family Medicine) Lloyd Huger, MD as Consulting Physician (Oncology) Clent Jacks, RN as Oncology Nurse Navigator  CHIEF COMPLAINT: Stage IVb ovarian cancer.  INTERVAL HISTORY: Patient returns to clinic today for further evaluation and consideration of cycle 4 of dose reduced carboplatinum and Taxol.  She is tolerating her treatments well without significant side effects.  She does not complain of weakness or fatigue today.  She denies any pain.  She has no neurologic complaints.  She denies any fevers.  She has no chest pain, shortness of breath, cough, or hemoptysis.  She denies any abdominal pain.  She does not complain of issues with her colostomy today.  She denies any nausea, vomiting, constipation, or diarrhea.  She has no urinary complaints.  Patient offers no specific complaints today.  REVIEW OF SYSTEMS:   Review of Systems  Constitutional: Negative.  Negative for diaphoresis, fever, malaise/fatigue and weight loss.  Respiratory: Negative.  Negative for cough and shortness of breath.   Cardiovascular: Negative.  Negative for chest pain and leg swelling.  Gastrointestinal: Negative.  Negative for abdominal pain, nausea and vomiting.  Genitourinary: Negative.  Negative for dysuria.  Musculoskeletal: Negative.  Negative for joint pain.  Skin: Negative.  Negative for rash.  Neurological: Negative.  Negative for dizziness, sensory change, weakness and headaches.  Psychiatric/Behavioral: Negative.  The patient is not nervous/anxious.     As per HPI. Otherwise, a complete review of systems is negative.  PAST MEDICAL HISTORY: Past Medical History:  Diagnosis Date   Anxiety    Arthritis    Breast cancer ([place]) 2013   RIGHT lumpectomy   Bursitis of elbow     RIGHT   Cancer [facility]) 2013   right breast lumpectomy with a few nodes removed   Chronic kidney disease    stage 3   Colitis    Coronary artery disease    Diverticulitis    Dysrhythmia    according to anxiety, tachy   Family history of breast cancer    Family history of melanoma    GERD (gastroesophageal reflux disease)    okay since took her gallbladder out   H/O lumpectomy    Heart disease    History of breast cancer    History of coronary artery stent placement 2007   one stent   HOH (hard of hearing)    Hypercholesteremia    Hypertension    Hypothyroidism    Myocardial infarction [facility])    2007   Osteopenia    Palpitations    Personal history of radiation therapy    Skin cancer     PAST SURGICAL HISTORY: Past Surgical History:  Procedure Laterality Date   BREAST LUMPECTOMY Right 2013   lymph node dissection   CATARACT EXTRACTION W/PHACO Right 07/23/2015   Procedure: CATARACT EXTRACTION PHACO AND INTRAOCULAR LENS PLACEMENT (IOC);  Surgeon: Birder Robson, MD;  Location: [facility] ORS;  Service: Ophthalmology;  Laterality: Right;  [place] 01:24 AP% 26.7 CDE 22.47 fluid pack lot # [id number]   CATARACT EXTRACTION W/PHACO Left 08/13/2015   Procedure: CATARACT EXTRACTION PHACO AND INTRAOCULAR LENS PLACEMENT (IOC);  Surgeon: Birder Robson, MD;  Location: [facility] ORS;  Service: Ophthalmology;  Laterality: Left;  [place] 02:40 AP% 30.7 CDE 49.28 fluid pack lot # [id number]   CHOLECYSTECTOMY     COLONOSCOPY  CORONARY ANGIOPLASTY  2007   EYE SURGERY     bilateral cataract   HARDWARE REMOVAL Right 04/29/2016   Procedure: HARDWARE REMOVAL;  Surgeon: Hessie Knows, MD;  Location: [facility] ORS;  Service: Orthopedics;  Laterality: Right;   IR IMAGING GUIDED PORT INSERTION  01/12/2020   ORIF ELBOW FRACTURE Right 10/01/2015   Procedure: OPEN REDUCTION INTERNAL FIXATION (ORIF) ELBOW/OLECRANON FRACTURE;  Surgeon: Hessie Knows, MD;  Location: [facility] ORS;  Service:  Orthopedics;  Laterality: Right;   TRANSVERSE LOOP COLOSTOMY N/A 01/26/2020   Procedure: TRANSVERSE LOOP COLOSTOMY;  Surgeon: Ronny Bacon, MD;  Location: [facility] ORS;  Service: General;  Laterality: N/A;   VIDEO BRONCHOSCOPY WITH ENDOBRONCHIAL NAVIGATION N/A 01/01/2020   Procedure: VIDEO BRONCHOSCOPY WITH ENDOBRONCHIAL NAVIGATION WITH CELLZIZIO;  Surgeon: Tyler Pita, MD;  Location: [facility] ORS;  Service: Pulmonary;  Laterality: N/A;   VIDEO BRONCHOSCOPY WITH ENDOBRONCHIAL ULTRASOUND N/A 01/01/2020   Procedure: VIDEO BRONCHOSCOPY WITH ENDOBRONCHIAL ULTRASOUND;  Surgeon: Tyler Pita, MD;  Location: [facility] ORS;  Service: Pulmonary;  Laterality: N/A;    FAMILY HISTORY: Reviewed and unchanged. No reported history of malignancy or chronic disease.     ADVANCED DIRECTIVES:    HEALTH MAINTENANCE: Social History   Tobacco Use   Smoking status: Former Smoker    Packs/day: 0.25    Years: 2.00    Pack years: 0.50    Types: Cigarettes    Quit date: 12/12/1955    Years since quitting: 64.4   Smokeless tobacco: Never Used  Vaping Use   Vaping Use: Never used  Substance Use Topics   Alcohol use: Yes    Comment: wine occasionally   Drug use: No     Colonoscopy:  PAP:  Bone density:  Lipid panel:  Allergies  Allergen Reactions   Prednisone Other (See Comments)    Mood alterations    Current Outpatient Medications  Medication Sig Dispense Refill   acetaminophen (TYLENOL) 500 MG tablet Take 500 mg by mouth every 8 (eight) hours as needed for moderate pain.      aspirin EC 81 MG tablet Take 81 mg by mouth at bedtime. Swallow whole.      carvedilol (COREG) 3.125 MG tablet Take 3.125 mg by mouth 2 (two) times daily.      Cholecalciferol (VITAMIN D-3 PO) Take 1,000 mg by mouth daily.      Coenzyme Q10 (CO Q 10 PO) Take 100 mg by mouth daily.      levothyroxine (SYNTHROID) 25 MCG tablet Take 25 mcg by mouth daily before breakfast.     lidocaine-prilocaine (EMLA)  cream Apply to affected area once 30 g 3   mirtazapine (REMERON) 7.5 MG tablet Take 7.5 mg by mouth at bedtime.     nitroGLYCERIN (NITROSTAT) 0.4 MG SL tablet Place 0.4 mg under the tongue every 5 (five) minutes x 3 doses as needed for chest pain.      pantoprazole (PROTONIX) 20 MG tablet Take 1 tablet (20 mg total) by mouth daily. 30 tablet 1   simvastatin (ZOCOR) 40 MG tablet Take 40 mg by mouth at bedtime.      No current facility-administered medications for this visit.    OBJECTIVE: There were no vitals filed for this visit.   There is no height or weight on file to calculate BMI.    ECOG FS:0 - Asymptomatic  General: Well-developed, well-nourished, no acute distress. Eyes: Pink conjunctiva, anicteric sclera. HEENT: Normocephalic, moist mucous membranes. Lungs: No audible wheezing or coughing. Heart: Regular rate and  rhythm. Abdomen: Soft, nontender, no obvious distention. Musculoskeletal: No edema, cyanosis, or clubbing. Neuro: Alert, answering all questions appropriately. Cranial nerves grossly intact. Skin: No rashes or petechiae noted. Psych: Normal affect.   LAB RESULTS:  Lab Results  Component Value Date   NA 136 04/18/2020   K 3.9 04/18/2020   CL 102 04/18/2020   CO2 26 04/18/2020   GLUCOSE 102 (H) 04/18/2020   BUN 28 (H) 04/18/2020   CREATININE 0.99 04/18/2020   CALCIUM 9.0 04/18/2020   PROT 7.5 04/18/2020   ALBUMIN 4.0 04/18/2020   AST 22 04/18/2020   ALT 11 04/18/2020   ALKPHOS 49 04/18/2020   BILITOT 0.4 04/18/2020   GFRNONAA 56 (L) 04/18/2020   GFRAA 58 (L) 02/21/2020    Lab Results  Component Value Date   WBC 11.0 (H) 04/18/2020   NEUTROABS 6.7 04/18/2020   HGB 10.2 (L) 04/18/2020   HCT 31.4 (L) 04/18/2020   MCV 97.8 04/18/2020   PLT 238 04/18/2020     STUDIES: No results found.  ASSESSMENT: Stage IVb ovarian cancer.  PLAN:    1.  Stage IVb ovarian cancer: Despite history of breast cancer, biopsy consistent with carcinoma of  gynecologic origin.  PET scan results from December 11, 2019 reviewed independently with large right pelvic mass, extensive retroperitoneal lymphadenopathy and bilateral pulmonary disease consistent with metastatic ovarian cancer.  Appreciate gynecology oncology input.  Patient only received 1 dose of dose reduced carboplatinum and Taxol on January 16, 2020 before requiring surgery for malignant obstruction related to her malignancy.  Patient's CA-125 remains essentially unchanged at 708.0.  Proceed with cycle 4 of treatment today.  Return to clinic in 4 weeks with repeat imaging using PET scan and further evaluation.  2.  History of breast cancer: Patient has a history of stage IIb ER/PR positive, upper inner quadrant right breast.  Previously we did not have access to patient's pathology report, her stage and location of breast were determined based on clinic notes.  She received 6 months of Arimidex between April 2013 and October 2013 prior to undergoing right breast lumpectomy. She reports she underwent adjuvant XRT, but did not receive adjuvant chemotherapy.  Patient completed 5 years of Arimidex in October 2018.  Her most recent mammogram on January 30, 2019 was reported as BI-RADS 1.    3.  Anxiety: Improved.  Patient receives IV Ativan with patient's premeds for each treatment.  Continue Xanax as needed at home. 4.  Diverting colostomy: Continue home health care and follow-up with surgery as indicated. 5.  Leukocytosis: Resolved. 6.  Anemia: Chronic and unchanged.  Patient's hemoglobin is 10.2 today.  I spent a total of 30 minutes reviewing chart data, face-to-face evaluation with the patient, counseling and coordination of care as detailed above.   Patient expressed understanding and was in agreement with this plan. She also understands that She can call clinic at any time with any questions, concerns, or complaints.    Lloyd Huger, MD   05/11/2020 9:00 AM

## 2020-05-14 ENCOUNTER — Inpatient Hospital Stay: Payer: Medicare PPO

## 2020-05-14 ENCOUNTER — Ambulatory Visit: Payer: Medicare PPO

## 2020-05-16 ENCOUNTER — Inpatient Hospital Stay: Payer: Medicare PPO | Admitting: Hospice and Palliative Medicine

## 2020-05-16 ENCOUNTER — Inpatient Hospital Stay: Payer: Medicare PPO | Admitting: Oncology

## 2020-05-16 ENCOUNTER — Inpatient Hospital Stay: Payer: Medicare PPO

## 2020-05-21 ENCOUNTER — Telehealth: Payer: Self-pay | Admitting: Hematology and Oncology

## 2020-05-21 NOTE — Telephone Encounter (Signed)
Re:  Delusions  Patient's niece, Horris Latino ([phone]), called regarding patient's discharge from [facility] tomorrow.  Patient has been having delusions (trying to take uterus for experimentation; locked away and not feeding her).  Family has requested a psychiatric evaluation.  She would like assistance in getting her aunt evaluated.   Lequita Asal, MD

## 2020-05-22 ENCOUNTER — Other Ambulatory Visit: Payer: Self-pay | Admitting: *Deleted

## 2020-05-22 DIAGNOSIS — F22 Delusional disorders: Secondary | ICD-10-CM

## 2020-05-30 ENCOUNTER — Inpatient Hospital Stay (HOSPITAL_BASED_OUTPATIENT_CLINIC_OR_DEPARTMENT_OTHER): Payer: Medicare PPO | Admitting: Hospice and Palliative Medicine

## 2020-05-30 ENCOUNTER — Inpatient Hospital Stay: Payer: Medicare PPO

## 2020-05-30 ENCOUNTER — Other Ambulatory Visit: Payer: Self-pay

## 2020-05-30 ENCOUNTER — Inpatient Hospital Stay: Payer: Medicare PPO | Admitting: Oncology

## 2020-05-30 ENCOUNTER — Encounter: Payer: Self-pay | Admitting: Hospice and Palliative Medicine

## 2020-05-30 VITALS — BP 148/65 | HR 71 | Temp 97.8°F | Resp 20 | Wt 115.9 lb

## 2020-05-30 DIAGNOSIS — C569 Malignant neoplasm of unspecified ovary: Secondary | ICD-10-CM

## 2020-05-30 DIAGNOSIS — F22 Delusional disorders: Secondary | ICD-10-CM

## 2020-05-30 DIAGNOSIS — Z515 Encounter for palliative care: Secondary | ICD-10-CM | POA: Diagnosis not present

## 2020-05-30 LAB — URINALYSIS, COMPLETE (UACMP) WITH MICROSCOPIC
Bilirubin Urine: NEGATIVE
Glucose, UA: NEGATIVE mg/dL
Hgb urine dipstick: NEGATIVE
Ketones, ur: NEGATIVE mg/dL
Nitrite: NEGATIVE
Protein, ur: NEGATIVE mg/dL
Specific Gravity, Urine: 1.02 (ref 1.005–1.030)
pH: 5 (ref 5.0–8.0)

## 2020-05-30 LAB — CBC WITH DIFFERENTIAL/PLATELET
Abs Immature Granulocytes: 0.04 10*3/uL (ref 0.00–0.07)
Basophils Absolute: 0.1 10*3/uL (ref 0.0–0.1)
Basophils Relative: 1 %
Eosinophils Absolute: 1 10*3/uL — ABNORMAL HIGH (ref 0.0–0.5)
Eosinophils Relative: 8 %
HCT: 33.5 % — ABNORMAL LOW (ref 36.0–46.0)
Hemoglobin: 10.7 g/dL — ABNORMAL LOW (ref 12.0–15.0)
Immature Granulocytes: 0 %
Lymphocytes Relative: 26 %
Lymphs Abs: 3.3 10*3/uL (ref 0.7–4.0)
MCH: 32.4 pg (ref 26.0–34.0)
MCHC: 31.9 g/dL (ref 30.0–36.0)
MCV: 101.5 fL — ABNORMAL HIGH (ref 80.0–100.0)
Monocytes Absolute: 0.8 10*3/uL (ref 0.1–1.0)
Monocytes Relative: 6 %
Neutro Abs: 7.6 10*3/uL (ref 1.7–7.7)
Neutrophils Relative %: 59 %
Platelets: 178 10*3/uL (ref 150–400)
RBC: 3.3 MIL/uL — ABNORMAL LOW (ref 3.87–5.11)
RDW: 15.1 % (ref 11.5–15.5)
WBC: 12.8 10*3/uL — ABNORMAL HIGH (ref 4.0–10.5)
nRBC: 0 % (ref 0.0–0.2)

## 2020-05-30 LAB — COMPREHENSIVE METABOLIC PANEL
ALT: 9 U/L (ref 0–44)
AST: 18 U/L (ref 15–41)
Albumin: 4.1 g/dL (ref 3.5–5.0)
Alkaline Phosphatase: 47 U/L (ref 38–126)
Anion gap: 6 (ref 5–15)
BUN: 38 mg/dL — ABNORMAL HIGH (ref 8–23)
CO2: 31 mmol/L (ref 22–32)
Calcium: 9.5 mg/dL (ref 8.9–10.3)
Chloride: 102 mmol/L (ref 98–111)
Creatinine, Ser: 1.07 mg/dL — ABNORMAL HIGH (ref 0.44–1.00)
GFR, Estimated: 51 mL/min — ABNORMAL LOW (ref >60)
Glucose, Bld: 101 mg/dL — ABNORMAL HIGH (ref 70–99)
Potassium: 4.3 mmol/L (ref 3.5–5.1)
Sodium: 139 mmol/L (ref 135–145)
Total Bilirubin: 0.5 mg/dL (ref 0.3–1.2)
Total Protein: 7.8 g/dL (ref 6.5–8.1)

## 2020-05-30 LAB — VITAMIN B12: Vitamin B-12: 1117 pg/mL — ABNORMAL HIGH (ref 180–914)

## 2020-05-30 LAB — FOLATE: Folate: 43 ng/mL (ref >5.9)

## 2020-05-30 NOTE — Progress Notes (Signed)
[place]  [phone] [phone]   Name: Sherri [name] Date: 05/30/2020 MRN: [id number]  DOB: 08/17/1932  Patient Care Team: [name], MD as PCP - General (Family Medicine) Lloyd Huger, MD as Consulting Physician (Oncology) Clent Jacks, RN as Oncology Nurse Navigator    REASON FOR CONSULTATION: Sherri ROHNER is a 86 y.o. female with multiple medical problems including history of stage IIb breast cancer status post lumpectomy and XRT with completion of 5 years of Arimidex in October 2019. She was subsequently diagnosed with stage IV ovarian cancer in June 2021. PET scan on 12/11/2019 revealed diffuse pulmonary metastatic disease with extensive retroperitoneal lymphadenopathy and right ureter obstructed by her pelvic mass with right-sided hydronephrosis. Currently on systemic chemotherapy.  Patient was hospitalized 01/24/2020-01/30/2020 with progressive abdominal pain and distention.  CT of abdomen and pelvis revealed colonic obstruction secondary to her large right pelvic malignancy.  She is s/p diverting loop colostomy on 8/27 for palliation.  Palliative care was consulted to help address goals and manage ongoing symptoms.   SOCIAL HISTORY:     reports that she quit smoking about 64 years ago. Her smoking use included cigarettes. She has a 0.50 pack-year smoking history. She has never used smokeless tobacco. She reports current alcohol use. She reports that she does not use drugs.  Patient is divorced. She lives at home with her brother. She has a son in [place] and another son and daughter who live out of state. Patient has a nephew who is very involved in her care.  Patient previously worked as a Educational psychologist.  ADVANCE DIRECTIVES:  Does not have  CODE STATUS: DNR  PAST MEDICAL HISTORY: Past Medical History:  Diagnosis Date  . Anxiety   . Arthritis   . Breast cancer ([place]) 2013    RIGHT lumpectomy  . Bursitis of elbow    RIGHT  . Cancer [facility]) 2013   right breast lumpectomy with a few nodes removed  . Chronic kidney disease    stage 3  . Colitis   . Coronary artery disease   . Diverticulitis   . Dysrhythmia    according to anxiety, tachy  . Family history of breast cancer   . Family history of melanoma   . GERD (gastroesophageal reflux disease)    okay since took her gallbladder out  . H/O lumpectomy   . Heart disease   . History of breast cancer   . History of coronary artery stent placement 2007   one stent  . HOH (hard of hearing)   . Hypercholesteremia   . Hypertension   . Hypothyroidism   . Myocardial infarction ([place])    2007  . Osteopenia   . Palpitations   . Personal history of radiation therapy   . Skin cancer     PAST SURGICAL HISTORY:  Past Surgical History:  Procedure Laterality Date  . BREAST LUMPECTOMY Right 2013   lymph node dissection  . CATARACT EXTRACTION W/PHACO Right 07/23/2015   Procedure: CATARACT EXTRACTION PHACO AND INTRAOCULAR LENS PLACEMENT (IOC);  Surgeon: Birder Robson, MD;  Location: [facility] ORS;  Service: Ophthalmology;  Laterality: Right;  [place] 01:24 AP% 26.7 CDE 22.47 fluid pack lot # [id number]  . CATARACT EXTRACTION W/PHACO Left 08/13/2015   Procedure: CATARACT EXTRACTION PHACO AND INTRAOCULAR LENS PLACEMENT (IOC);  Surgeon: Birder Robson, MD;  Location: [facility] OR[facility];  Service: Ophthalmology;  Laterality: Left;  [place] 02:40 AP% 30.7 CDE 49.28 fluid pack lot #  [id number]  . CHOLECYSTECTOMY    . COLONOSCOPY    . CORONARY ANGIOPLASTY  2007  . EYE SURGERY     bilateral cataract  . HARDWARE REMOVAL Right 04/29/2016   Procedure: HARDWARE REMOVAL;  Surgeon: Hessie Knows, MD;  Location: [facility] OR[facility];  Service: Orthopedics;  Laterality: Right;  . IR IMAGING GUIDED PORT INSERTION  01/12/2020  . ORIF ELBOW FRACTURE Right 10/01/2015   Procedure: OPEN REDUCTION INTERNAL FIXATION (ORIF) ELBOW/OLECRANON FRACTURE;  Surgeon: Hessie Knows,  MD;  Location: [facility];  Service: Orthopedics;  Laterality: Right;  . TRANSVERSE LOOP COLOSTOMY N/A 01/26/2020   Procedure: TRANSVERSE LOOP COLOSTOMY;  Surgeon: Ronny Bacon, MD;  Location: [facility];  Service: General;  Laterality: N/A;  . VIDEO BRONCHOSCOPY WITH ENDOBRONCHIAL NAVIGATION N/A 01/01/2020   Procedure: VIDEO BRONCHOSCOPY WITH ENDOBRONCHIAL NAVIGATION WITH CELLZIZIO;  Surgeon: Tyler Pita, MD;  Location: [facility];  Service: Pulmonary;  Laterality: N/A;  . VIDEO BRONCHOSCOPY WITH ENDOBRONCHIAL ULTRASOUND N/A 01/01/2020   Procedure: VIDEO BRONCHOSCOPY WITH ENDOBRONCHIAL ULTRASOUND;  Surgeon: Tyler Pita, MD;  Location: [facility];  Service: Pulmonary;  Laterality: N/A;    HEMATOLOGY/ONCOLOGY HISTORY:  Oncology History  Ovarian cancer ([place])  01/05/2020 Initial Diagnosis   Ovarian cancer ([place])   01/12/2020 Cancer Staging   Staging form: Ovary, Fallopian Tube, and Primary Peritoneal Carcinoma, AJCC 8th Edition - Clinical: FIGO Stage IVB (cTX, cN1b, pM1b) - Signed by Lloyd Huger, MD on 01/12/2020   01/16/2020 -  Chemotherapy   The patient had palonosetron (ALOXI) injection 0.25 mg, 0.25 mg, Intravenous,  Once, 4 of 6 cycles Administration: 0.25 mg (01/16/2020), 0.25 mg (03/06/2020), 0.25 mg (03/28/2020), 0.25 mg (04/18/2020) pegfilgrastim (NEULASTA ONPRO KIT) injection 6 mg, 6 mg, Subcutaneous, Once, 4 of 6 cycles Administration: 6 mg (01/16/2020), 6 mg (03/28/2020), 6 mg (04/18/2020) CARBOplatin (PARAPLATIN) 210 mg in sodium chloride 0.9 % 250 mL chemo infusion, 210 mg (103.7 % of original dose 204.4 mg), Intravenous,  Once, 4 of 6 cycles Dose modification:   (original dose 204.4 mg, Cycle 1) Administration: 210 mg (01/16/2020), 250 mg (03/06/2020), 230 mg (03/28/2020), 230 mg (04/18/2020) fosaprepitant (EMEND) 150 mg in sodium chloride 0.9 % 145 mL IVPB, 150 mg, Intravenous,  Once, 4 of 6 cycles Administration: 150 mg (01/16/2020), 150 mg (03/06/2020), 150 mg (03/28/2020),  150 mg (04/18/2020) PACLitaxel (TAXOL) 180 mg in sodium chloride 0.9 % 250 mL chemo infusion (> 14m/m2), 110 mg/m2 = 180 mg (100 % of original dose 110 mg/m2), Intravenous,  Once, 4 of 6 cycles Dose modification: 110 mg/m2 (original dose 110 mg/m2, Cycle 1, Reason: Patient Age) Administration: 180 mg (01/16/2020), 180 mg (03/06/2020), 180 mg (03/28/2020), 180 mg (04/18/2020)  for chemotherapy treatment.     Genetic Testing   Negative germline and HRD testing. No pathogenic variants identified on the [facility] myChoice panel. The report date is 02/26/2020.  The [facility] panel offered by [employer] sequencing and deletion/duplication testing of the following 35 genes: APC, ATM, AXIN2, BARD1, BMPR1A, BRCA1, BRCA2, BRIP1, CHD1, CDK4, CDKN2A, CHEK2, EPCAM (large rearrangement only), HOXB13, GALNT12, MLH1, MSH2, MSH3, MSH6, MUTYH, NBN, NTHL1, PALB2, PMS2, PTEN, RAD51C, RAD51D, RNF43, RPS20, SMAD4, STK11, and TP53. Sequencing was performed for select regions of POLE and POLD1, and large rearrangement analysis was performed for select regions of GREM1.      ALLERGIES:  is allergic to prednisone.  MEDICATIONS:  Current Outpatient Medications  Medication Sig Dispense Refill  . acetaminophen (TYLENOL) 500 MG tablet Take 500 mg  by mouth every 8 (eight) hours as needed for moderate pain.     Marland Kitchen aspirin EC 81 MG tablet Take 81 mg by mouth at bedtime. Swallow whole.     . carvedilol (COREG) 3.125 MG tablet Take 3.125 mg by mouth 2 (two) times daily.     . Cholecalciferol (VITAMIN D-3 PO) Take 1,000 mg by mouth daily.     . Coenzyme Q10 (CO Q 10 PO) Take 100 mg by mouth daily.     Marland Kitchen levothyroxine (SYNTHROID) 25 MCG tablet Take 25 mcg by mouth daily before breakfast.    . lidocaine-prilocaine (EMLA) cream Apply to affected area once 30 g 3  . mirtazapine (REMERON) 7.5 MG tablet Take 7.5 mg by mouth at bedtime.    . nitroGLYCERIN (NITROSTAT) 0.4 MG SL tablet Place 0.4 mg  under the tongue every 5 (five) minutes x 3 doses as needed for chest pain.     . pantoprazole (PROTONIX) 20 MG tablet Take 1 tablet (20 mg total) by mouth daily. 30 tablet 1  . simvastatin (ZOCOR) 40 MG tablet Take 40 mg by mouth at bedtime.      No current facility-administered medications for this visit.    VITAL SIGNS: There were no vitals taken for this visit. There were no vitals filed for this visit.  Estimated body mass index is 20.03 kg/m as calculated from the following:   Height as of 03/06/20: '5\' 4"'  (1.626 m).   Weight as of 05/01/20: 116 lb 11.2 oz (52.9 kg).  LABS: CBC:    Component Value Date/Time   WBC 11.0 (H) 04/18/2020 0911   HGB 10.2 (L) 04/18/2020 0911   HCT 31.4 (L) 04/18/2020 0911   PLT 238 04/18/2020 0911   MCV 97.8 04/18/2020 0911   NEUTROABS 6.7 04/18/2020 0911   LYMPHSABS 2.8 04/18/2020 0911   MONOABS 1.2 (H) 04/18/2020 0911   EOSABS 0.1 04/18/2020 0911   BASOSABS 0.1 04/18/2020 0911   Comprehensive Metabolic Panel:    Component Value Date/Time   NA 136 04/18/2020 0911   K 3.9 04/18/2020 0911   CL 102 04/18/2020 0911   CO2 26 04/18/2020 0911   BUN 28 (H) 04/18/2020 0911   CREATININE 0.99 04/18/2020 0911   GLUCOSE 102 (H) 04/18/2020 0911   CALCIUM 9.0 04/18/2020 0911   AST 22 04/18/2020 0911   ALT 11 04/18/2020 0911   ALKPHOS 49 04/18/2020 0911   BILITOT 0.4 04/18/2020 0911   PROT 7.5 04/18/2020 0911   ALBUMIN 4.0 04/18/2020 0911    RADIOGRAPHIC STUDIES: No results found.  PERFORMANCE STATUS (ECOG) : 2 - Symptomatic, <50% confined to bed  Review of Systems Unless otherwise noted, a complete review of systems is negative.  Physical Exam General: NAD, frail appearing Pulmonary: Unlabored Extremities: no edema, no joint deformities Skin: no rashes Neurological: Weakness but otherwise nonfocal  IMPRESSION: Patient saw Dr. Theora Gianotti on 05/01/2020.  She was scheduled for PET on 05/14/2020 and then to see Dr. [name] and me on 05/16/2020  but missed both of those appointments.  I have had several conversations with family via telephone.  They report that patient has been persistently paranoid that we are "experimenting on her" to the extent that patient has called the police multiple times to report the same.  Apparently, patient feels that we are trying to remove her uterus against her will and then experiment on the tissue.  Family placed her at [facility] for respite care for a week as they went out  of town to attend a wedding.  I requested that patient be evaluated by psychiatry while at SNF but that did not occur.  Family plan to place patient in an ALF given increasing difficulty with managing her care at home.    I had a lengthy conversation with patient and niece today.  Patient continues to insist that she was told that the oncology team plan to extract her uterus for experimentation.  She says she was provided extensive documentation about this but has been unable to provide said documentation to family or me when prompted.  Patient states definitively that she does not want surgery.  I explained that she would have to consent to surgery and no one would be forcing her to undergo any treatments that she did not want.  Regarding goals, patient says that she does want to continue chemotherapy.  She understands that to continue treatment will require her to have the PET scan.  She consents to this.  She says that she knows she would die if treatment were discontinued and she does not want that.  Patient was also in agreement with undergoing work-up for organic causes of confusion.  However given patient's persistent delusions, I suspect that she might ultimately benefit from initiation of a low-dose antipsychotic.  I discussed that with her today but she said she needed to think about it.  Note that there appears to be some contention between patient and family regarding need for chronic placement.  It does appear that patient  would likely benefit from medical supervision such as provided in an ALF.  PLAN: -Continue current scope of treatment -Reschedule PET -Recommend CT of the brain, CBC, CMP, TSH, B12, RBC folate, and UA -Recommend rotating from mirtazapine to olanzapine if patient will allow -RTC to be determined  Case and plan discussed with Dr. [name] and Beckey Rutter, NP   Patient expressed understanding and was in agreement with this plan. She also understands that She can call the clinic at any time with any questions, concerns, or complaints.     Time Total: 45 minutes  Visit consisted of counseling and education dealing with the complex and emotionally intense issues of symptom management and palliative care in the setting of serious and potentially life-threatening illness.Greater than 50%  of this time was spent counseling and coordinating care related to the above assessment and plan.  Signed by: Altha Harm, PhD, NP-C

## 2020-05-30 NOTE — Progress Notes (Signed)
Patient denies any concerns today.  

## 2020-05-31 ENCOUNTER — Encounter: Payer: Self-pay | Admitting: Oncology

## 2020-05-31 LAB — THYROID PANEL WITH TSH
Free Thyroxine Index: 1.8 (ref 1.2–4.9)
T3 Uptake Ratio: 25 % (ref 24–39)
T4, Total: 7.2 ug/dL (ref 4.5–12.0)
TSH: 4.22 u[IU]/mL (ref 0.450–4.500)

## 2020-05-31 NOTE — Progress Notes (Signed)
[place]  Telephone:([phone] [phone]  ID: [name] OB: 08-11-1932  MR#: [id number]  [id number]  Patient Care Team: [name], MD as PCP - General (Family Medicine) Lloyd Huger, MD as Consulting Physician (Oncology) Clent Jacks, RN as Oncology Nurse Navigator  CHIEF COMPLAINT: Stage IVb ovarian cancer.  INTERVAL HISTORY: Patient returns to clinic today as an add-on for further evaluation.  She recently has become highly suspicious and paranoid with concerns of being experimented on.  Her family reports unusual behavior at home and calling the police several times.  She is anxious and guarded, but offers no specific complaints. She does not complain of weakness or fatigue today.  She denies any pain.  She has no neurologic complaints.  She denies any fevers.  She has no chest pain, shortness of breath, cough, or hemoptysis.  She denies any abdominal pain.  She does not complain of issues with her colostomy today.  She denies any nausea, vomiting, constipation, or diarrhea.  She has no urinary complaints.  Patient offers no specific complaints today.  REVIEW OF SYSTEMS:   Review of Systems  Constitutional: Negative.  Negative for diaphoresis, fever, malaise/fatigue and weight loss.  Respiratory: Negative.  Negative for cough and shortness of breath.   Cardiovascular: Negative.  Negative for chest pain and leg swelling.  Gastrointestinal: Negative.  Negative for abdominal pain, nausea and vomiting.  Genitourinary: Negative.  Negative for dysuria.  Musculoskeletal: Negative.  Negative for joint pain.  Skin: Negative.  Negative for rash.  Neurological: Negative.  Negative for dizziness, sensory change, weakness and headaches.  Psychiatric/Behavioral: The patient is nervous/anxious.     As per HPI. Otherwise, a complete review of systems is negative.  PAST MEDICAL HISTORY: Past Medical History:  Diagnosis Date  .  Anxiety   . Arthritis   . Breast cancer ([place]) 2013   RIGHT lumpectomy  . Bursitis of elbow    RIGHT  . Cancer [facility]) 2013   right breast lumpectomy with a few nodes removed  . Chronic kidney disease    stage 3  . Colitis   . Coronary artery disease   . Diverticulitis   . Dysrhythmia    according to anxiety, tachy  . Family history of breast cancer   . Family history of melanoma   . GERD (gastroesophageal reflux disease)    okay since took her gallbladder out  . H/O lumpectomy   . Heart disease   . History of breast cancer   . History of coronary artery stent placement 2007   one stent  . HOH (hard of hearing)   . Hypercholesteremia   . Hypertension   . Hypothyroidism   . Myocardial infarction ([place])    2007  . Osteopenia   . Palpitations   . Personal history of radiation therapy   . Skin cancer     PAST SURGICAL HISTORY: Past Surgical History:  Procedure Laterality Date  . BREAST LUMPECTOMY Right 2013   lymph node dissection  . CATARACT EXTRACTION W/PHACO Right 07/23/2015   Procedure: CATARACT EXTRACTION PHACO AND INTRAOCULAR LENS PLACEMENT (IOC);  Surgeon: Birder Robson, MD;  Location: [facility];  Service: Ophthalmology;  Laterality: Right;  [place] 01:24 AP% 26.7 CDE 22.47 fluid pack lot # [id number]  . CATARACT EXTRACTION W/PHACO Left 08/13/2015   Procedure: CATARACT EXTRACTION PHACO AND INTRAOCULAR LENS PLACEMENT (IOC);  Surgeon: Birder Robson, MD;  Location: [facility];  Service: Ophthalmology;  Laterality: Left;  [place] 02:40 AP% 30.7  CDE 49.28 fluid pack lot # [id number]  . CHOLECYSTECTOMY    . COLONOSCOPY    . CORONARY ANGIOPLASTY  2007  . EYE SURGERY     bilateral cataract  . HARDWARE REMOVAL Right 04/29/2016   Procedure: HARDWARE REMOVAL;  Surgeon: Hessie Knows, MD;  Location: [facility];  Service: Orthopedics;  Laterality: Right;  . IR IMAGING GUIDED PORT INSERTION  01/12/2020  . ORIF ELBOW FRACTURE Right 10/01/2015   Procedure: OPEN REDUCTION INTERNAL FIXATION  (ORIF) ELBOW/OLECRANON FRACTURE;  Surgeon: Hessie Knows, MD;  Location: [facility];  Service: Orthopedics;  Laterality: Right;  . TRANSVERSE LOOP COLOSTOMY N/A 01/26/2020   Procedure: TRANSVERSE LOOP COLOSTOMY;  Surgeon: Ronny Bacon, MD;  Location: [facility];  Service: General;  Laterality: N/A;  . VIDEO BRONCHOSCOPY WITH ENDOBRONCHIAL NAVIGATION N/A 01/01/2020   Procedure: VIDEO BRONCHOSCOPY WITH ENDOBRONCHIAL NAVIGATION WITH CELLZIZIO;  Surgeon: Tyler Pita, MD;  Location: [facility];  Service: Pulmonary;  Laterality: N/A;  . VIDEO BRONCHOSCOPY WITH ENDOBRONCHIAL ULTRASOUND N/A 01/01/2020   Procedure: VIDEO BRONCHOSCOPY WITH ENDOBRONCHIAL ULTRASOUND;  Surgeon: Tyler Pita, MD;  Location: [facility];  Service: Pulmonary;  Laterality: N/A;    FAMILY HISTORY: Reviewed and unchanged. No reported history of malignancy or chronic disease.     ADVANCED DIRECTIVES:    HEALTH MAINTENANCE: Social History   Tobacco Use  . Smoking status: Former Smoker    Packs/day: 0.25    Years: 2.00    Pack years: 0.50    Types: Cigarettes    Quit date: 12/12/1955    Years since quitting: 64.5  . Smokeless tobacco: Never Used  Vaping Use  . Vaping Use: Never used  Substance Use Topics  . Alcohol use: Yes    Comment: wine occasionally  . Drug use: No     Colonoscopy:  PAP:  Bone density:  Lipid panel:  Allergies  Allergen Reactions  . Prednisone Other (See Comments)    Mood alterations    Current Outpatient Medications  Medication Sig Dispense Refill  . acetaminophen (TYLENOL) 500 MG tablet Take 500 mg by mouth every 8 (eight) hours as needed for moderate pain.     Marland Kitchen aspirin EC 81 MG tablet Take 81 mg by mouth at bedtime. Swallow whole.     . carvedilol (COREG) 3.125 MG tablet Take 3.125 mg by mouth 2 (two) times daily.     . Cholecalciferol (VITAMIN D-3 PO) Take 1,000 mg by mouth daily.     . Coenzyme Q10 (CO Q 10 PO) Take 100 mg by mouth daily.     Marland Kitchen levothyroxine (SYNTHROID) 25  MCG tablet Take 25 mcg by mouth daily before breakfast.    . lidocaine-prilocaine (EMLA) cream Apply to affected area once 30 g 3  . mirtazapine (REMERON) 7.5 MG tablet Take 7.5 mg by mouth at bedtime.    . nitroGLYCERIN (NITROSTAT) 0.4 MG SL tablet Place 0.4 mg under the tongue every 5 (five) minutes x 3 doses as needed for chest pain.     . pantoprazole (PROTONIX) 20 MG tablet Take 1 tablet (20 mg total) by mouth daily. 30 tablet 1  . simvastatin (ZOCOR) 40 MG tablet Take 40 mg by mouth at bedtime.      No current facility-administered medications for this visit.    OBJECTIVE: There were no vitals filed for this visit.   There is no height or weight on file to calculate BMI.    ECOG FS:0 - Asymptomatic  General: Well-developed, well-nourished, no acute distress. Eyes:  Pink conjunctiva, anicteric sclera. HEENT: Normocephalic, moist mucous membranes. Lungs: No audible wheezing or coughing. Heart: Regular rate and rhythm. Abdomen: Soft, nontender, no obvious distention. Musculoskeletal: No edema, cyanosis, or clubbing. Neuro: Alert, answering all questions appropriately. Cranial nerves grossly intact. Skin: No rashes or petechiae noted. Psych: Guarded.   LAB RESULTS:  Lab Results  Component Value Date   NA 139 05/30/2020   K 4.3 05/30/2020   CL 102 05/30/2020   CO2 31 05/30/2020   GLUCOSE 101 (H) 05/30/2020   BUN 38 (H) 05/30/2020   CREATININE 1.07 (H) 05/30/2020   CALCIUM 9.5 05/30/2020   PROT 7.8 05/30/2020   ALBUMIN 4.1 05/30/2020   AST 18 05/30/2020   ALT 9 05/30/2020   ALKPHOS 47 05/30/2020   BILITOT 0.5 05/30/2020   GFRNONAA 51 (L) 05/30/2020   GFRAA 58 (L) 02/21/2020    Lab Results  Component Value Date   WBC 12.8 (H) 05/30/2020   NEUTROABS 7.6 05/30/2020   HGB 10.7 (L) 05/30/2020   HCT 33.5 (L) 05/30/2020   MCV 101.5 (H) 05/30/2020   PLT 178 05/30/2020     STUDIES: No results found.  ASSESSMENT: Stage IVb ovarian cancer.  PLAN:    1.  Stage IVb  ovarian cancer: Despite history of breast cancer, biopsy consistent with carcinoma of gynecologic origin.  PET scan results from December 11, 2019 reviewed independently with large right pelvic mass, extensive retroperitoneal lymphadenopathy and bilateral pulmonary disease consistent with metastatic ovarian cancer.  Appreciate gynecology oncology input.  Patient received 1 dose of dose reduced carboplatinum and Taxol on January 16, 2020 before requiring surgery for malignant obstruction related to her malignancy.  She then reinitiated treatment on March 06, 2020 and completed cycle 4 on April 18, 2020.  Since that time she has become suspicious with delusional behavior.  She has reluctantly agreed to laboratory work and repeat imaging including PET scan and CT of her brain.  Her most recent CA-125 was 708.0.  Today's result is pending.  Return to clinic after her imaging to discuss the results.    2.  History of breast cancer: Patient has a history of stage IIb ER/PR positive, upper inner quadrant right breast.  Previously we did not have access to patient's pathology report, her stage and location of breast were determined based on clinic notes.  She received 6 months of Arimidex between April 2013 and October 2013 prior to undergoing right breast lumpectomy. She reports she underwent adjuvant XRT, but did not receive adjuvant chemotherapy.  Patient completed 5 years of Arimidex in October 2018.  Her most recent mammogram on January 30, 2019 was reported as BI-RADS 1.    3.  Anxiety: Improved.  Patient receives IV Ativan with patient's premeds for each treatment.  Continue Xanax as needed at home. 4.  Diverting colostomy: Continue home health care and follow-up with surgery as indicated. 5.  Leukocytosis: Chronic and unchanged. 6.  Anemia: Chronic and unchanged.  Patient's hemoglobin is 10.7 today. 7.  Delusional thinking: Laboratory work from today is essentially within normal limits and CT of the brain is  pending.  There is no obvious sign of infection.  Referral has been made to psychiatry.  I spent a total of 30 minutes reviewing chart data, face-to-face evaluation with the patient, counseling and coordination of care as detailed above.   Patient expressed understanding and was in agreement with this plan. She also understands that She can call clinic at any time with any questions, concerns, or  complaints.    Jeralyn Ruths, MD   05/31/2020 8:03 AM

## 2020-06-01 LAB — URINE CULTURE: Culture: <10000 — AB

## 2020-06-03 ENCOUNTER — Telehealth: Payer: Self-pay | Admitting: *Deleted

## 2020-06-03 ENCOUNTER — Other Ambulatory Visit: Payer: Self-pay | Admitting: Nurse Practitioner

## 2020-06-03 MED ORDER — OLANZAPINE 5 MG PO TABS: 5.0000 mg | ORAL_TABLET | Freq: Every day | ORAL | 0 refills | Status: DC

## 2020-06-03 NOTE — Telephone Encounter (Signed)
Thank you :)

## 2020-06-03 NOTE — Telephone Encounter (Signed)
I thought Josh ordered, not sure which med he was going to go with.Marland KitchenMarland Kitchen

## 2020-06-03 NOTE — Telephone Encounter (Signed)
Remeron discontinued. Zyprexa 5 mg qhs sent to pharmacy.

## 2020-06-03 NOTE — Telephone Encounter (Signed)
New medicine was to have been ordered last week, but it was not sent to pharmacy. Per a MyChart message it was to have been Olanzapine Please advise if you can order this for her  PLAN: -Continue current scope of treatment -Reschedule PET -Recommend CT of the brain, CBC, CMP, TSH, B12, RBC folate, and UA -Recommend rotating from mirtazapine to olanzapine if patient will allow -RTC to be determined  Case and plan discussed with Dr. Orlie Dakin and Consuello Masse, NP   Patient expressed understanding and was in agreement with this plan. She also understands that She can call the clinic at any time with any questions, concerns, or complaints.     Time Total: 45 minutes  Visit consisted of counseling and education dealing with the complex and emotionally intense issues of symptom management and palliative care in the setting of serious and potentially life-threatening illness.Greater than 50%  of this time was spent counseling and coordinating care related to the above assessment and plan.  Signed by: Laurette Schimke, PhD, NP-C

## 2020-06-04 ENCOUNTER — Telehealth: Payer: Self-pay | Admitting: Hospice and Palliative Medicine

## 2020-06-04 ENCOUNTER — Other Ambulatory Visit: Payer: Self-pay | Admitting: Hospice and Palliative Medicine

## 2020-06-04 MED ORDER — OLANZAPINE 5 MG PO TABS: 5.0000 mg | ORAL_TABLET | Freq: Every day | ORAL | 3 refills | Status: AC

## 2020-06-04 NOTE — Telephone Encounter (Signed)
I spoke with patient's niece.  She requested the olanzapine be sent to [facility].  Rx sent.  Niece tells me that patient has refused ALF locally and is considering a facility in [facility] near [place].  She understands this would necessitate transferring oncology care.  She will keep [place] updated.

## 2020-06-04 NOTE — Telephone Encounter (Signed)
Thank you! Patient had refused olanzapine last week.

## 2020-06-04 NOTE — Progress Notes (Signed)
I spoke with patient's niece. Olanzapine was sent yesterday to [facility]. Family requested this be sent to [facility]. Rx sent.

## 2020-06-11 ENCOUNTER — Encounter: Payer: Self-pay | Admitting: Oncology

## 2020-06-11 ENCOUNTER — Telehealth: Payer: Self-pay | Admitting: Licensed Clinical Social Worker

## 2020-06-11 NOTE — Telephone Encounter (Signed)
Spoke with pt's niece [name] and revealed negative HRD (somatic) testing. These results will be uploaded to molecular pathology. [name] notes patient will be moving to [place] later this month.

## 2020-06-12 ENCOUNTER — Encounter: Payer: Self-pay | Admitting: Oncology

## 2020-06-12 ENCOUNTER — Ambulatory Visit: Payer: Medicare PPO

## 2020-06-17 ENCOUNTER — Telehealth: Payer: Self-pay | Admitting: Oncology

## 2020-06-17 NOTE — Telephone Encounter (Signed)
OK, please cancel appts.  Will not reschedule at this time.

## 2020-06-17 NOTE — Telephone Encounter (Signed)
Pt's niece [name]) called and stated that pt is refusing her appts on 1/19. She also provided the update that she will moving to [facility] (assisted living) at the end of this week.

## 2020-06-17 NOTE — Telephone Encounter (Signed)
I believe that is the ALF in [place] that the family had mentioned. Family were aware that they would need to transfer oncology care if patient desired any further workup/treatment.

## 2020-06-18 ENCOUNTER — Ambulatory Visit: Payer: Medicare PPO

## 2020-06-19 ENCOUNTER — Inpatient Hospital Stay: Payer: Medicare PPO | Admitting: Hospice and Palliative Medicine

## 2020-06-19 ENCOUNTER — Inpatient Hospital Stay: Payer: Medicare PPO | Admitting: Oncology

## 2020-07-07 ENCOUNTER — Encounter: Payer: Self-pay | Admitting: Oncology

## 2020-08-13 ENCOUNTER — Telehealth: Payer: Self-pay | Admitting: Nurse Practitioner

## 2020-08-13 NOTE — Telephone Encounter (Signed)
Spoke with patient's niece, Fuller [place], to offer to schedule a Palliative f/u visit and she stated that the patient has moved into an assisted living facility in [facility] and that she is doing well.  Told niece that we would need to discharge patient from Palliative services due to out of service area and she was in agreement with this.  I have notified PCP and Palliative Team.

## 2021-01-06 ENCOUNTER — Telehealth: Payer: Self-pay | Admitting: *Deleted

## 2021-01-06 NOTE — Telephone Encounter (Signed)
Received fax request to refill Mirtazapine 7.5 mg at hs from pharmacy. It looks like this has not been refilled since 01/16/20

## 2021-01-09 NOTE — Telephone Encounter (Signed)
Another refill request received for [facility] for Mirtazapine.  Faxed back to pharmacy informing them patient is no longer care at our facility or [facility].

## 2021-01-30 ENCOUNTER — Encounter: Payer: Self-pay | Admitting: Oncology

## 2021-05-31 IMAGING — US US ABDOMEN LIMITED
1 series · 10 of 10 positions shown · non-contrast
Comparison: PET-CT 12/11/2019

CLINICAL DATA: Ovarian cancer. Patient with abdominal distension.
Evaluate for ascites.

EXAM:
LIMITED ABDOMEN ULTRASOUND FOR ASCITES
TECHNIQUE: Limited ultrasound survey for ascites was performed in all four
abdominal quadrants.

[Series 1: us abdomen limited · 10 of 10 slices shown]
[im 1/10]
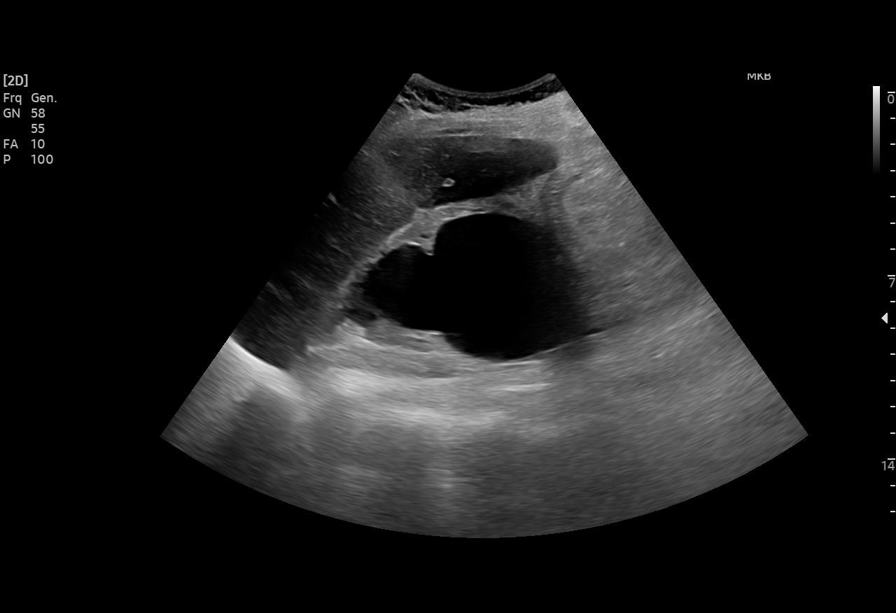
[im 2/10]
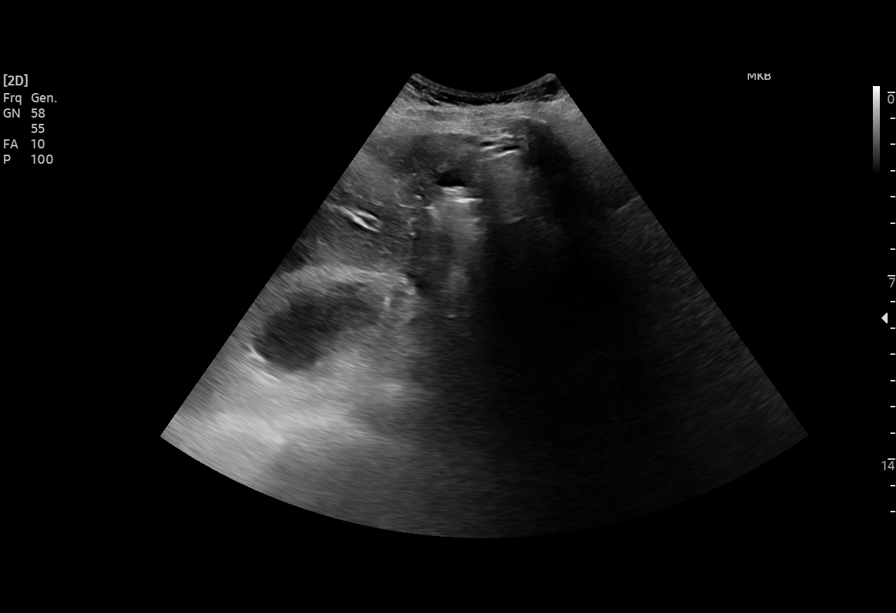
[im 3/10]
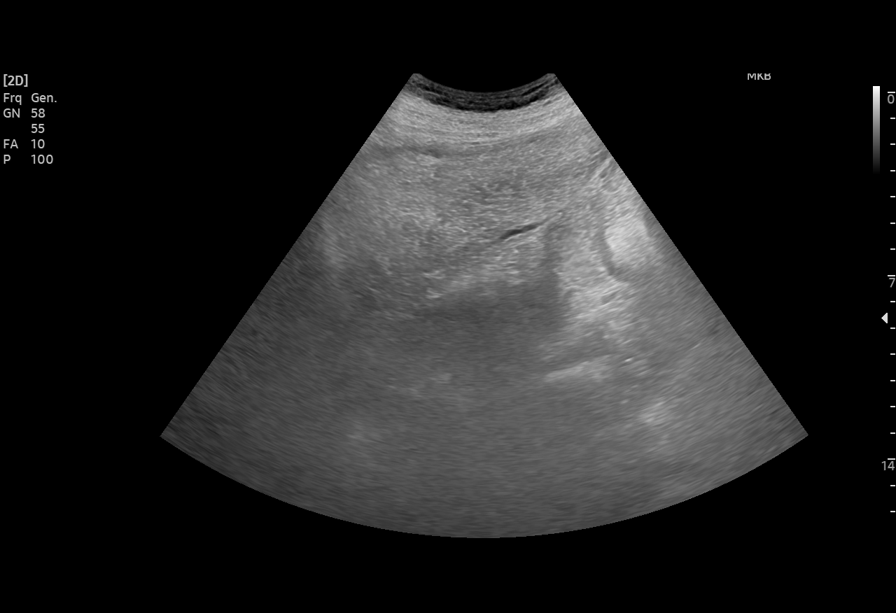
[im 4/10]
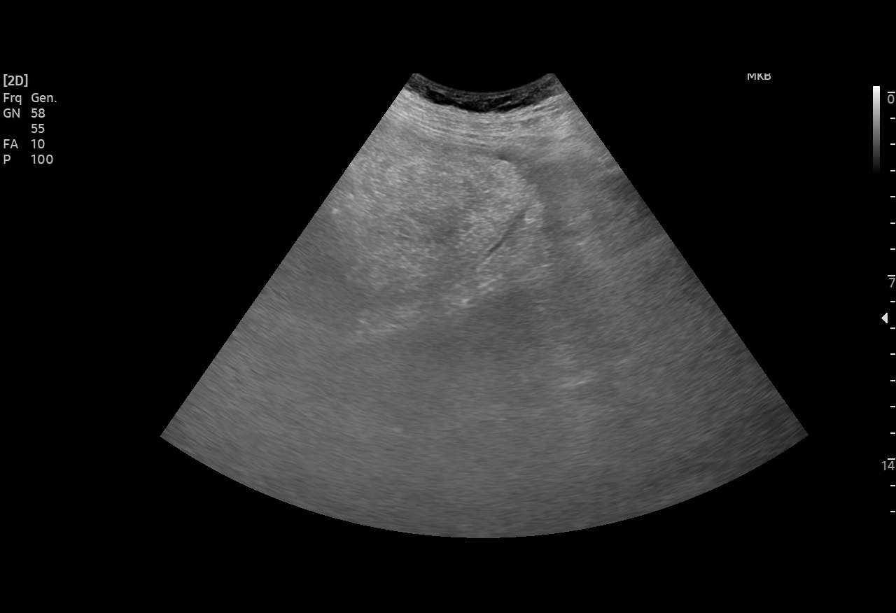
[im 5/10]
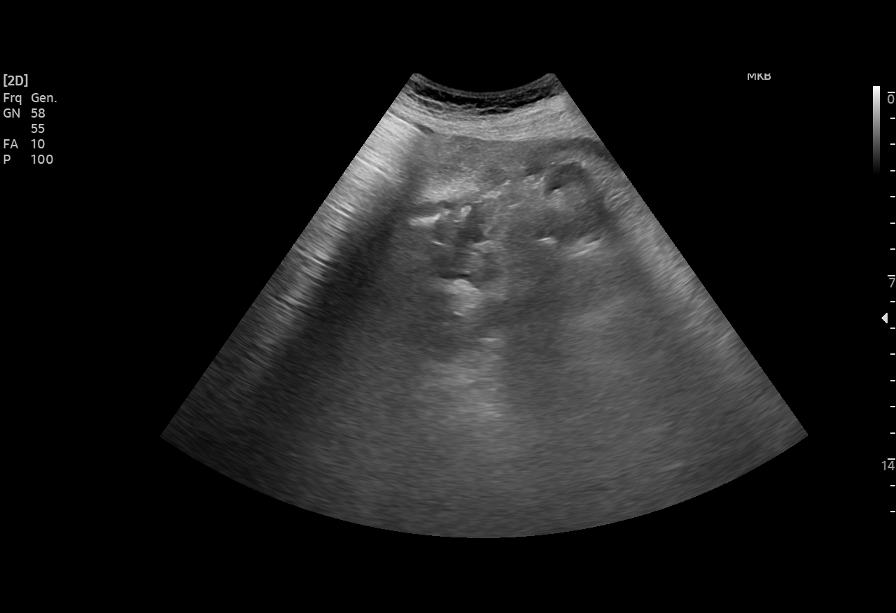
[im 6/10]
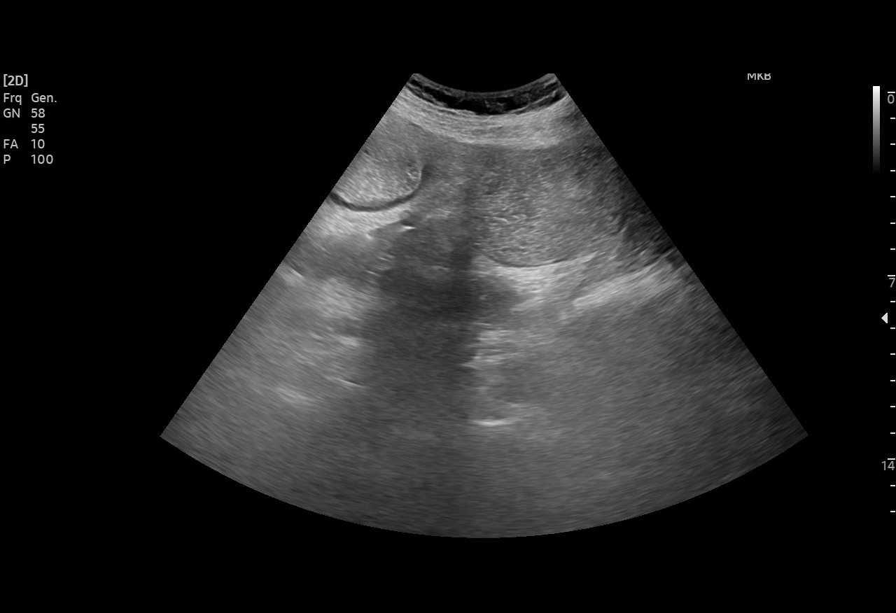
[im 7/10]
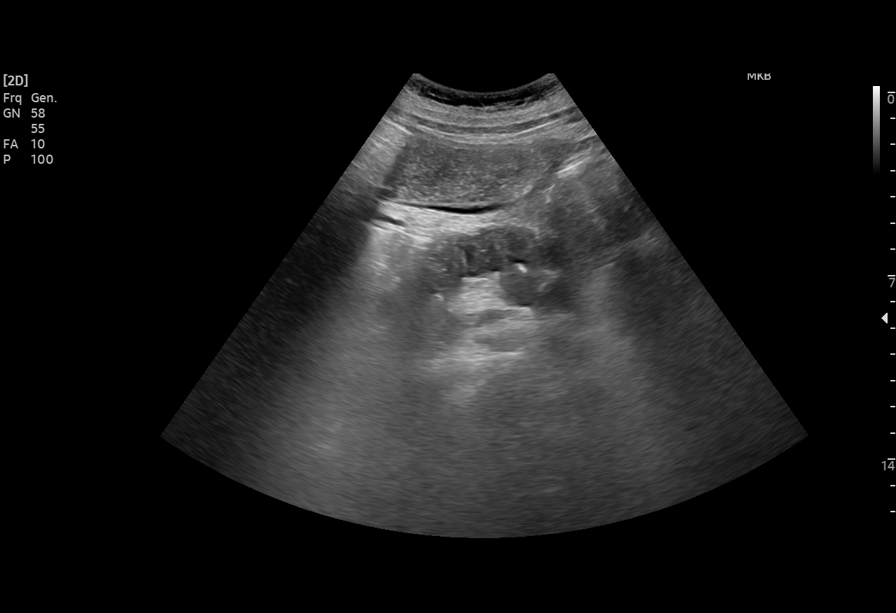
[im 8/10]
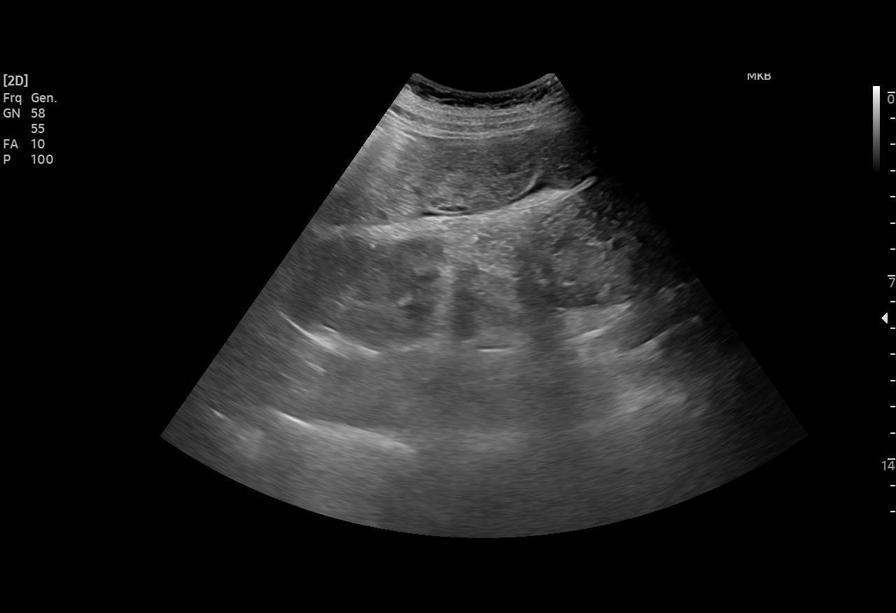
[im 9/10]
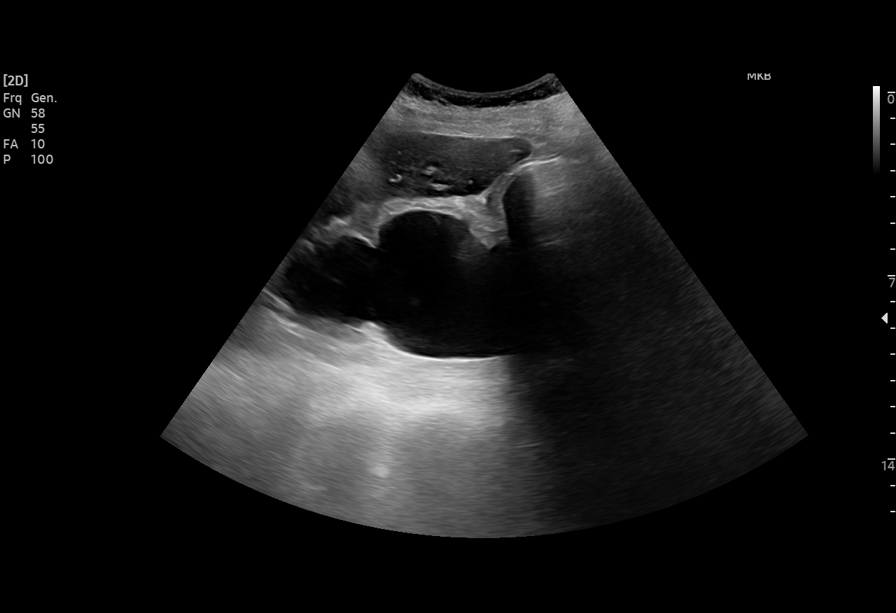
[im 10/10]
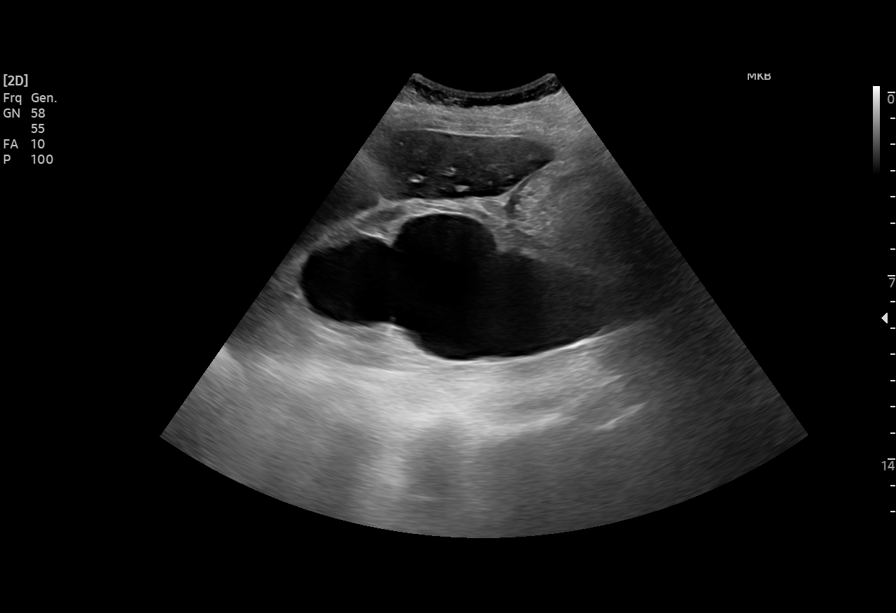

[10 of 10 positions shown; findings below may reference images not displayed]

FINDINGS: No significant ascites. Marked dilatation of the right renal
collecting system appears chronic and similar to the recent PET-CT.
IMPRESSION: No evidence for ascites.

Chronic right hydronephrosis.

## 2021-06-01 IMAGING — CT CT ABD-PELV W/O CM
2 of 4 series · 14 of 46 positions shown, 16 images · non-contrast
Comparison: Chest CT of November 16, 2019

CLINICAL DATA: Abdominal pain, acute nonlocalized in the setting of
metastatic ovarian cancer

EXAM:
CT ABDOMEN AND PELVIS WITHOUT CONTRAST
TECHNIQUE: Multidetector CT imaging of the abdomen and pelvis was performed
following the standard protocol without IV contrast.

[Series 2: axial st · axial · 0.71mm/px · z∈[-678,-273]mm · 11 of 97 slices shown, 13 images]
[im 8/97  soft-tissue]
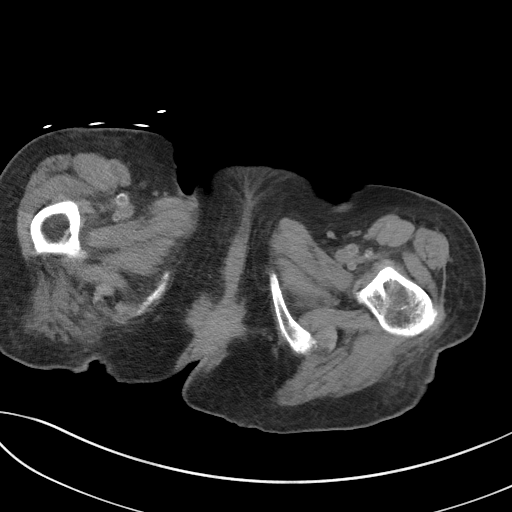
[im 8/97  bone]
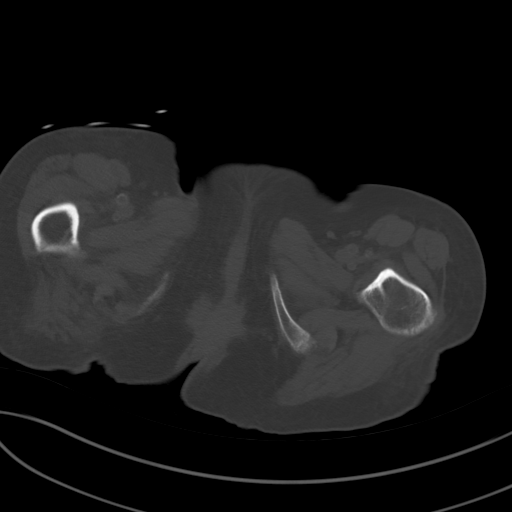
[im 16/97  soft-tissue]
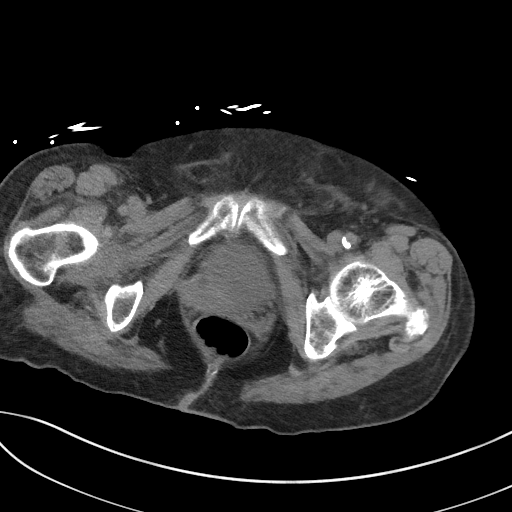
[im 24/97  soft-tissue]
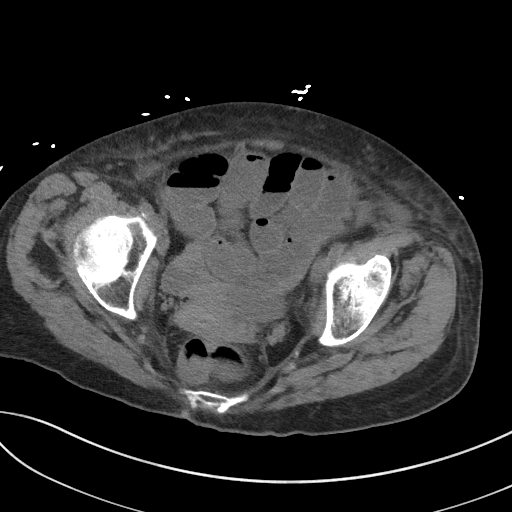
[im 31/97  soft-tissue]
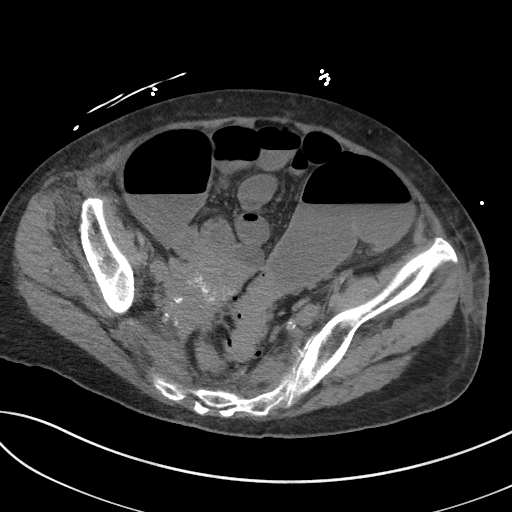
[im 39/97  soft-tissue]
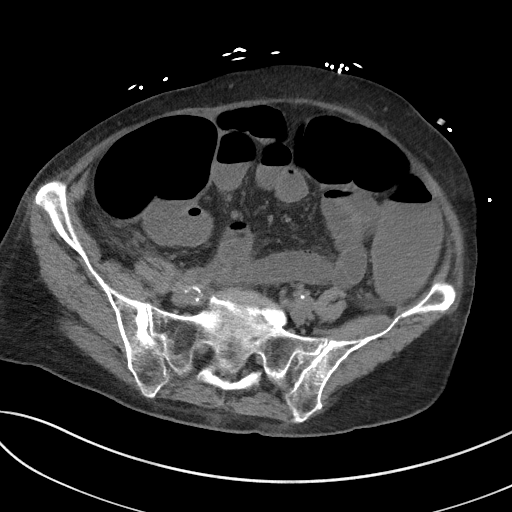
[im 50/97  soft-tissue]
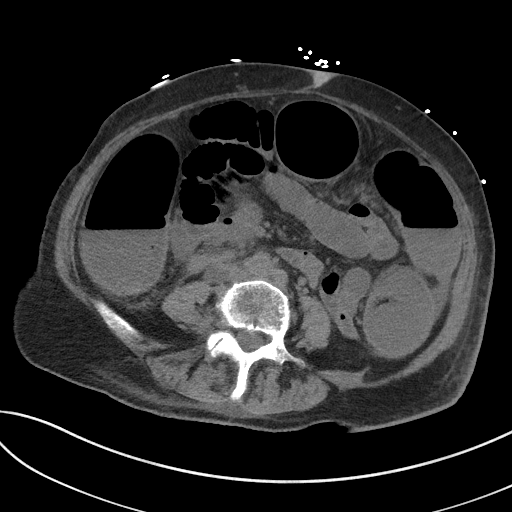
[im 58/97  soft-tissue]
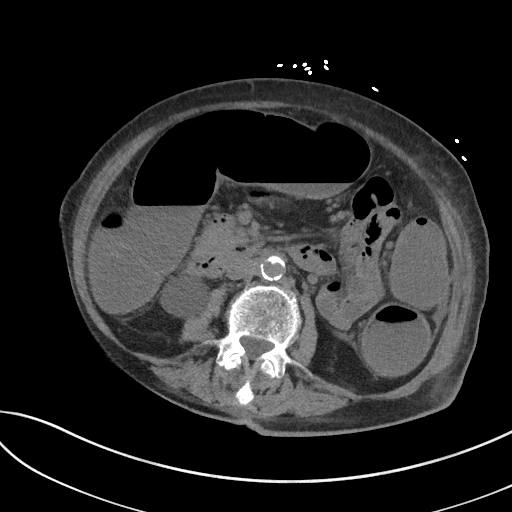
[im 66/97  soft-tissue]
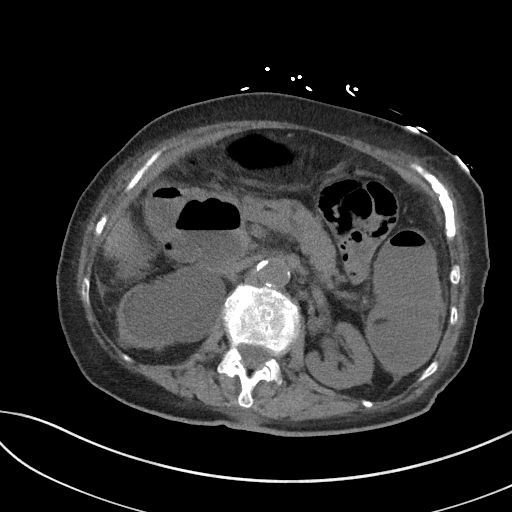
[im 73/97  soft-tissue]
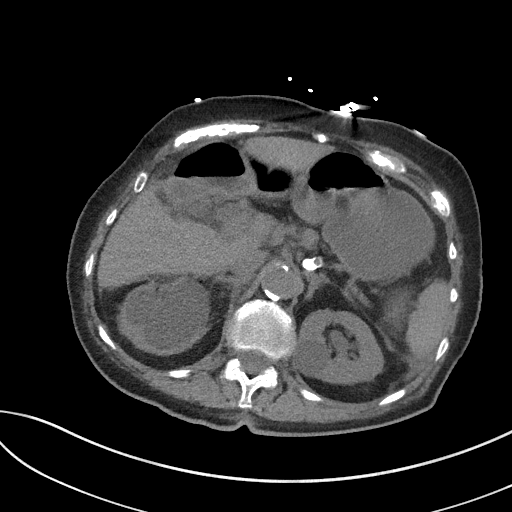
[im 73/97  bone]
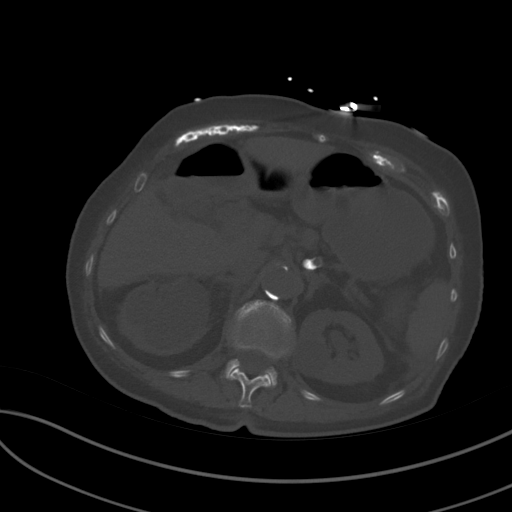
[im 81/97  soft-tissue]
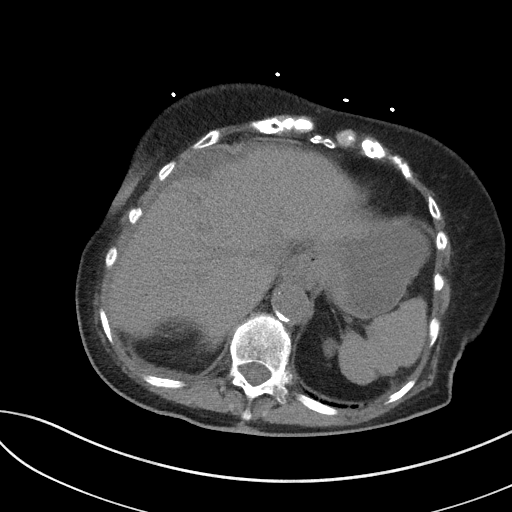
[im 89/97  soft-tissue]
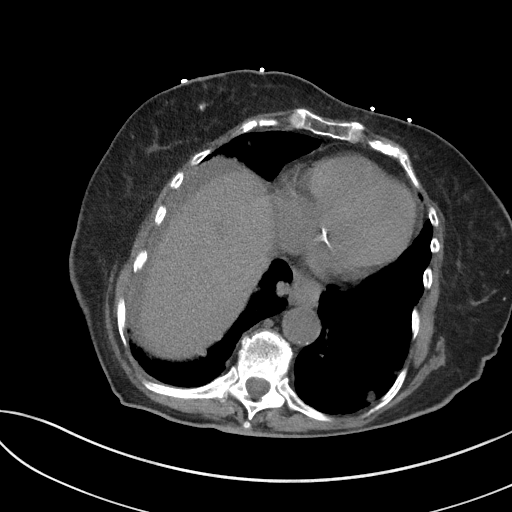

[Series 5: coronal st · coronal · 0.76mm/px · 3 of 91 slices shown]
[im 31/91  soft-tissue]
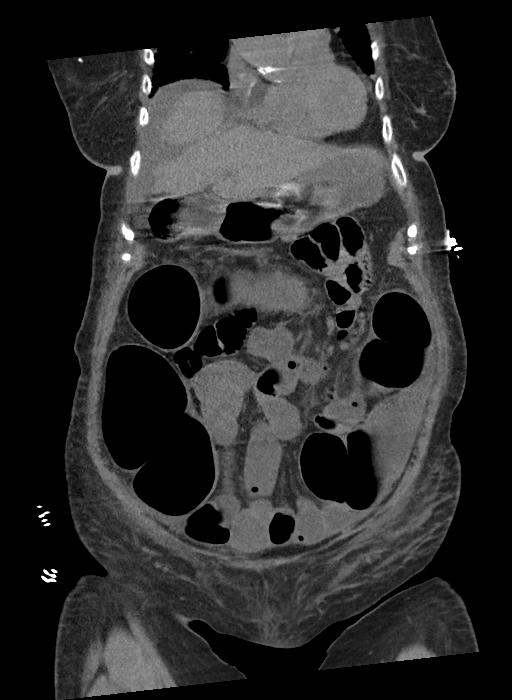
[im 41/91  soft-tissue]
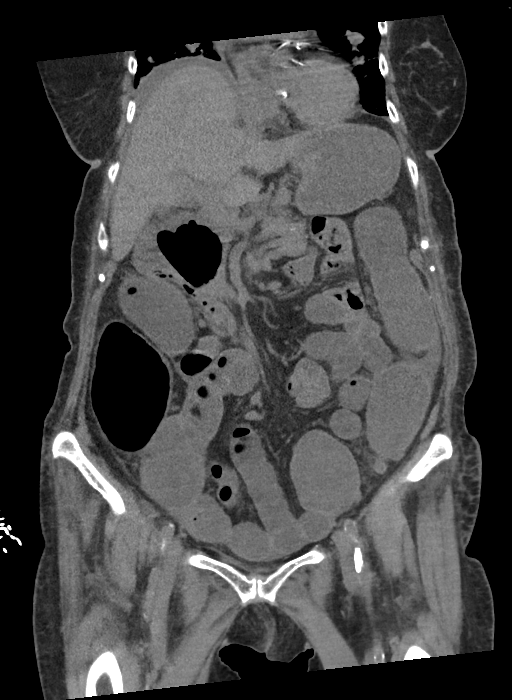
[im 51/91  soft-tissue]
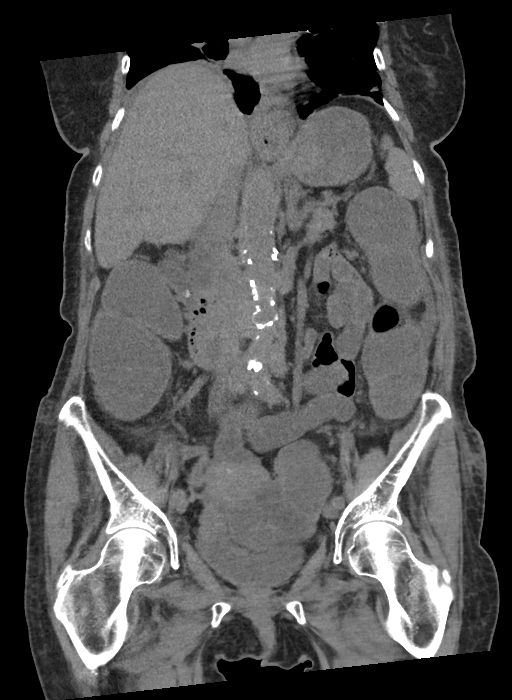

[14 of 46 positions shown; findings below may reference images not displayed]

FINDINGS: Lower chest: Signs of pulmonary metastatic disease and interstitial
lung disease.

No effusion.

Lingular nodule measuring approximately 1.4 cm previously 1.4-1.5
cm. The disease in the lung bases grossly unchanged. Upper lobes not
assessed

Hepatobiliary: Interval development of ascites since the prior
study.

No focal, suspicious hepatic lesion. Fluid is mainly about the
liver.

Pancreas: Pancreas without ductal dilation, inflammation or focal
lesion.

Spleen: Spleen normal size with lobulated contours.

Adrenals/Urinary Tract: Adrenal glands are normal.

Marked RIGHT-sided hydroureteronephrosis with chronic appearing
RIGHT-sided cortical thinning. RIGHT ureteral involvement from RIGHT
pelvic soft tissue mass (image 66, series [DATE] x 3.2 cm,
contiguous with RIGHT adnexa and inseparable from with may represent
the uterus but is obscured by soft tissue.

Distortion also at the RIGHT bladder base on image 77 of series 2.
No LEFT-sided hydronephrosis. Cyst in the LEFT kidney.

Stomach/Bowel: Distal colonic obstruction with sigmoid narrowing
best seen on image 67 of series 2. Irregular margins of the sigmoid
colon at this level.

Process results in small bowel dilation. There is a large duodenal
diverticulum. Pericolonic stranding throughout. Some nodularity of
lateral conal fascia and fascial planes in the abdomen. Omental
nodularity seen anterior to the transverse colon on image 52 of
series 2. No free air.

Tethering of small bowel loops in the pelvis.

Small hiatal hernia.

Vascular/Lymphatic: Calcified atheromatous plaque of the abdominal
aorta. Gastrohepatic adenopathy is suspected. Soft tissue may also
be present about the porta hepatis. No aneurysmal dilation. Pelvic
mass encases the RIGHT ureter and external iliac vasculature on the
RIGHT.

Reproductive: Mass in the RIGHT hemipelvis inseparable from
remaining pelvic viscera. Also tethering the upper portion of the
urinary bladder and RIGHT bladder wall.

Other: Small volume ascites mainly about the liver.

Musculoskeletal: Spinal degenerative changes. No acute or
destructive bone finding.
IMPRESSION: 1. Distal colonic obstruction associated with colitis presumably due
to metastatic disease to the surface of the sigmoid colon. Signs of
colitis with bowel distension of the colon up to 8 cm. GI and or
surgical consultation may be helpful.
2. Mass in the RIGHT hemipelvis obstructs the RIGHT ureter. Chronic
thinning of the RIGHT renal cortex in the setting of severe
RIGHT-sided hydro ureteral nephrosis. Mass also involves the RIGHT
urinary bladder, the uterus and tethers small-bowel loops in the
pelvis and adjacent sigmoid colon.
3. Extension of mass into the RIGHT pelvic sidewall as described.
4. Findings that suggest more diffuse peritoneal involvement. A
component of the ascitic fluid could also be due to colonic
inflammation.
5. Evidence of pulmonary metastatic disease and interstitial lung
disease.
6. Signs of pulmonary metastatic disease and interstitial lung
disease.
7. Aortic atherosclerosis.

Aortic Atherosclerosis ([id number]).

## 2021-09-29 DEATH — deceased
